# Patient Record
Sex: Male | Born: 1935 | Race: White | Hispanic: No | Marital: Single | Smoking: Former smoker
Health system: Southern US, Community
[De-identification: ages and names within clinical notes are randomized; demographics above are authoritative.]

## PROBLEM LIST (undated history)

## (undated) DIAGNOSIS — M1990 Unspecified osteoarthritis, unspecified site: Secondary | ICD-10-CM

## (undated) DIAGNOSIS — I10 Essential (primary) hypertension: Secondary | ICD-10-CM

## (undated) DIAGNOSIS — Z972 Presence of dental prosthetic device (complete) (partial): Secondary | ICD-10-CM

## (undated) DIAGNOSIS — I2510 Atherosclerotic heart disease of native coronary artery without angina pectoris: Principal | ICD-10-CM

## (undated) DIAGNOSIS — Z9889 Other specified postprocedural states: Secondary | ICD-10-CM

## (undated) DIAGNOSIS — E0500 Thyrotoxicosis with diffuse goiter without thyrotoxic crisis or storm: Secondary | ICD-10-CM

## (undated) DIAGNOSIS — Z85828 Personal history of other malignant neoplasm of skin: Secondary | ICD-10-CM

## (undated) DIAGNOSIS — C4491 Basal cell carcinoma of skin, unspecified: Secondary | ICD-10-CM

## (undated) DIAGNOSIS — K5909 Other constipation: Secondary | ICD-10-CM

## (undated) DIAGNOSIS — N400 Enlarged prostate without lower urinary tract symptoms: Secondary | ICD-10-CM

## (undated) DIAGNOSIS — Z973 Presence of spectacles and contact lenses: Secondary | ICD-10-CM

## (undated) DIAGNOSIS — K219 Gastro-esophageal reflux disease without esophagitis: Secondary | ICD-10-CM

## (undated) DIAGNOSIS — E785 Hyperlipidemia, unspecified: Secondary | ICD-10-CM

## (undated) DIAGNOSIS — C3490 Malignant neoplasm of unspecified part of unspecified bronchus or lung: Secondary | ICD-10-CM

## (undated) DIAGNOSIS — Z9861 Coronary angioplasty status: Principal | ICD-10-CM

## (undated) DIAGNOSIS — J439 Emphysema, unspecified: Secondary | ICD-10-CM

## (undated) DIAGNOSIS — Z955 Presence of coronary angioplasty implant and graft: Secondary | ICD-10-CM

## (undated) DIAGNOSIS — K08109 Complete loss of teeth, unspecified cause, unspecified class: Secondary | ICD-10-CM

## (undated) DIAGNOSIS — I6523 Occlusion and stenosis of bilateral carotid arteries: Secondary | ICD-10-CM

## (undated) DIAGNOSIS — N433 Hydrocele, unspecified: Secondary | ICD-10-CM

## (undated) DIAGNOSIS — Z974 Presence of external hearing-aid: Secondary | ICD-10-CM

## (undated) HISTORY — PX: APPENDECTOMY: SHX54

## (undated) HISTORY — PX: UMBILICAL HERNIA REPAIR: SHX2598

## (undated) HISTORY — PX: CATARACT EXTRACTION W/ INTRAOCULAR LENS  IMPLANT, BILATERAL: SHX1307

## (undated) HISTORY — DX: Benign prostatic hyperplasia without lower urinary tract symptoms: N40.0

## (undated) HISTORY — PX: TRANSTHORACIC ECHOCARDIOGRAM: SHX275

## (undated) HISTORY — DX: Atherosclerotic heart disease of native coronary artery without angina pectoris: I25.10

## (undated) HISTORY — DX: Essential (primary) hypertension: I10

## (undated) HISTORY — DX: Hyperlipidemia, unspecified: E78.5

## (undated) HISTORY — DX: Malignant neoplasm of unspecified part of unspecified bronchus or lung: C34.90

## (undated) HISTORY — DX: Coronary angioplasty status: Z98.61

## (undated) HISTORY — DX: Basal cell carcinoma of skin, unspecified: C44.91

## (undated) HISTORY — PX: PERCUTANEOUS CORONARY STENT INTERVENTION (PCI-S): SHX6016

## (undated) HISTORY — DX: Unspecified osteoarthritis, unspecified site: M19.90

---

## 2004-07-21 DIAGNOSIS — Z9861 Coronary angioplasty status: Secondary | ICD-10-CM

## 2004-07-21 DIAGNOSIS — I2510 Atherosclerotic heart disease of native coronary artery without angina pectoris: Secondary | ICD-10-CM

## 2004-07-21 HISTORY — DX: Atherosclerotic heart disease of native coronary artery without angina pectoris: I25.10

## 2004-07-21 HISTORY — DX: Coronary angioplasty status: Z98.61

## 2004-09-11 ENCOUNTER — Encounter: Admission: RE | Admit: 2004-09-11 | Discharge: 2004-09-11 | Payer: Self-pay | Admitting: *Deleted

## 2004-09-16 ENCOUNTER — Ambulatory Visit (HOSPITAL_COMMUNITY): Admission: RE | Admit: 2004-09-16 | Discharge: 2004-09-17 | Payer: Self-pay | Admitting: *Deleted

## 2004-09-16 DIAGNOSIS — Z955 Presence of coronary angioplasty implant and graft: Secondary | ICD-10-CM

## 2004-09-16 HISTORY — DX: Presence of coronary angioplasty implant and graft: Z95.5

## 2007-11-18 ENCOUNTER — Encounter: Admission: RE | Admit: 2007-11-18 | Discharge: 2007-11-18 | Payer: Self-pay | Admitting: General Surgery

## 2007-11-23 ENCOUNTER — Encounter (INDEPENDENT_AMBULATORY_CARE_PROVIDER_SITE_OTHER): Payer: Self-pay | Admitting: General Surgery

## 2007-11-23 ENCOUNTER — Ambulatory Visit (HOSPITAL_BASED_OUTPATIENT_CLINIC_OR_DEPARTMENT_OTHER): Admission: RE | Admit: 2007-11-23 | Discharge: 2007-11-23 | Payer: Self-pay | Admitting: General Surgery

## 2008-01-06 HISTORY — PX: NM MYOVIEW LTD: HXRAD82

## 2010-12-03 NOTE — Op Note (Signed)
NAME:  Jeremy Deleon, Jeremy Deleon NO.:  [id number]   MEDICAL RECORD NO.:  [id number]          PATIENT TYPE:  AMB   LOCATION:  NESC                         FACILITY:  [facility]   PHYSICIAN:  Anselm Pancoast. Weatherly, M.D.DATE OF BIRTH:  [birth year]   DATE OF PROCEDURE:  11/23/2007  DATE OF DISCHARGE:                               OPERATIVE REPORT   PREOPERATIVE DIAGNOSIS:  Umbilical hernia   OPERATION:  Repair of umbilical hernia with mesh.   ANESTHESIA:  General anesthesia.   SURGEON:  Consuello Bossier, M.D.   HISTORY:  Jeremy Deleon is a 77 year old moderately overweight male who was  referred to me through the courtesy of physician at [facility] for an  umbilical hernia.  The patient actually saw him the first of April, but  he had seen Dr. Maryagnes Amos in 2005 with an umbilical hernia and was scheduled  for repair, but because he was having problems with dizziness and it  turned out to be a cardiac issue, he postponed it.  His wife also  developed breast cancer and is on hemodialysis and for these various  reasons he postponed the surgery until now.  He is retired.  He has been  cleared by Dr. Jenne Campus.  He is on Plavix and that was discontinued for  about 5 days.  He is now here for repair of this umbilical hernia that  protrudes out about 3 inches and is about the size of a coffee cup.   DESCRIPTION OF PROCEDURE:  The patient was taken to the operative suite.  He was given a gram of Ancef, induction of general anesthesia and then  the abdomen prepped with Betadine surgical solution and draped in a  sterile manner.  Attention was made clean out the little fold and down  in the crevice of the area where the hernia kind of protrudes over  itself, there was a little skin irritation.  I draped him in a sterile  manner and then first actually just removed the little area of skin that  is kind of chronically ulcerated where it has been folded on itself.  I  did then separate the overlying  skin from the hernia sac  circumferentially and then opened down into the hernia sac and the  little omentum that was coming out through a little fascial defect about  the size of a 50 cent piece was actually removed and the little pedicles  tied with 2-0 Vicryl.  Then I freed up the hernia sac circumferentially,  actually removed it and closed the peritoneum transversely with a  running 2-0 Vicryl.  Next I created a little pocket completely around  the fascia overlying the peritoneum, so a little piece of regular  Prolene mesh about 3 inches x 3 inches could be placed.  The corners  were anchored out with 0 Prolene sutures and then we used a couple of  Prolene sutures superiorly, inferiorly and then actually closed the  little defect with Surgilon 0 since it is a little softer where there is  no subcutaneous tissue.  Next I anesthetized the  fascia with about 17 or  18 mL of 0.5% Marcaine with adrenalin.  I then trimmed about half of  this excess skin so I could close it transversely with 3-0 Vicryl in the  subcutaneous tissue, 5-0 nylon simple sutures and it would give a little  indentation at the umbilicus.  I then used a 4x4 kind of folded up  placed in the navel, a little antibiotic ointment on the suture line and  then 4x4s and tapes over  the area.  The patient tolerated the procedure nicely and will be  released after a short stay.  He is having a little problem with his  voiding and hopefully will be able to void.  If he is not, he is to turn  to the emergency room.  He can restart his Plavix tomorrow for his  cardiac issues.           ______________________________  Anselm Pancoast. Zachery Dakins, M.D.     WJW/MEDQ  D:  11/23/2007  T:  11/23/2007  Job:  [id number]   cc:   Dr. Mickle Asper. Zachery Dakins, M.D.  [address], [address]  [place]  [place] [address]

## 2010-12-06 NOTE — Cardiovascular Report (Signed)
NAME:  [name], Jeremy Deleon NO.:  [id number]   MEDICAL RECORD NO.:  [id number]          PATIENT TYPE:  OIB   LOCATION:  2899                         FACILITY:  [facility]   PHYSICIAN:  Darlin Priestly, MD  DATE OF BIRTH:  [birth year]   DATE OF PROCEDURE:  09/16/2004  DATE OF DISCHARGE:                              CARDIAC CATHETERIZATION   PROCEDURES:  1.  Coronary angiography.  2.  LAD-proximal.      1.  Percutaneous transluminal coronary balloon angioplasty.      2.  Placement of intracoronary stent.   ATTENDING PHYSICIAN:  Darlin Priestly, M.D.   COMPLICATIONS:  None.   INDICATIONS:  Mr. Wrage is a 77 year old male patient of Dr. Gabriel Earing  recently had an episode of presyncope associated with nausea.  He  subsequently underwent a Cardiolite stress test on July 25, 2004 revealing  inferolateral wall ischemia.  A 2-D echocardiogram revealed normal EF.  Following his abnormal Cardiolite, he underwent cardiac catheterization on  August 30, 2004 revealing 80% proximal LAD lesion crossing a first  diagonal.  There was no further significant coronary artery disease with  normal EF.  He is now referred for percutaneous intervention of the LAD.   DESCRIPTION OF OPERATION:  After obtaining informed written consent, the  patient was brought to the cardiac catheterization laboratory.  Right groin  was shaved and prepped and draped in the usual sterile fashion.  ECG monitor  was established.  Using the modified Seldinger technique, a 6 French  arterial sheath was inserted in the right femoral artery. Next, multiple  guide catheters were used in attempt to cannulate the LAD including a 6 and  7 [place] JL-4, 6 and 7 [place] JL-5, 7 [place] JL-4.5, Voda XB 3.5 with side  holes, 7 [place]; however, ultimately able to cannulate the left main  with a 7 French 3.5 guiding catheter with side holes.  Next, 0.014 ATW  marker wire was advanced out of the guiding catheter  and positioned in the  distal LAD without difficulty.  Following this, a Maverick 2.5 x 15-mm  balloon was then dropped across the proximal LAD stenotic lesion.  One  inflation of 6 atmospheres performed for a total of approximately 30  seconds.  Followup angiogram revealed no evidence of dissection or thrombus  with TIMI-3 flow to the distal vessel.  Next, we used a Taxus 3.0 x 16-mm  stent across the proximal LAD stenotic lesion.  This stent was initiated  deployed to a maximum of 8 atmospheres for a total of 35 seconds.  A second  inflation to 14 atmospheres performed for 25 seconds.  Followup angiogram  with no evidence of dissection or thrombus with TIMI-3 flow to the distal  vessel.  IV Angiomax was used throughout the case.   Final orthogonal angiograms reveal less than 10% residual stenosis in the  proximal LAD stenotic lesion with TIMI-3 flow to the distal vessel.  At this  point we elected to conclude the procedure.  All balloons, wires, and  catheters were removed.  Hemostatic sheath  was sewn in place and the patient  transferred to recovery room in stable condition.   CONCLUSIONS:  1.  Successful percutaneous transluminal coronary balloon angioplasty and a      placement of a Taxus 3.0 x 16-mm      stent in the proximal left anterior descending stenotic lesion.  2.  Tortuosity of the right iliac system.  3.  Adjunct use of Angiomax infusion.      RHM/MEDQ  D:  09/16/2004  T:  09/16/2004  Job:  [id number]

## 2010-12-06 NOTE — H&P (Signed)
NAME:  Jeremy Deleon, Jeremy Deleon NO.:  [id number]   MEDICAL RECORD NO.:  [id number]          PATIENT TYPE:  OIB   LOCATION:  2899                         FACILITY:  [facility]   PHYSICIAN:  Darlin Priestly, MD  DATE OF BIRTH:  1933/03/06   DATE OF ADMISSION:  09/16/2004  DATE OF DISCHARGE:                                HISTORY & PHYSICAL   Jeremy Deleon is a 77 year old white married male patient who was initially  seen in the office by Dr. Lenise Herald on August 09, 2004, referred by  Dr. Andi Devon.  He had had an episode of syncope.  He then underwent a  treadmill Cardiolite test and a 2-D echocardiogram.  Echo did not show any  structural abnormalities.  He had normal EF.  The Cardiolite test did show  some inferior ischemia, thus he went on to undergo heart catheterization.  This was performed at [facility] and he had multiple stenoses  in his LAD, 80% proximal and 60% mid and mid distal.  He had a 50% in his  RCA.  His EF was 60%.  He comes to [employer] today for  intervention.   PAST MEDICAL HISTORY:  Negative for any chronic illnesses.  He had had no  prior history of hypertension or diabetes.  He quit smoking about 25 years  prior.  He did not have a premature family history of heart disease.  In the  office, though, his blood pressure was elevated at 170/100, thus he is now  on hypertensive medication.   MEDICATIONS:  1.  Toprol XL 50 mg every day.  2.  Celebrex 200 mg every day p.r.n.  3.  Enteric coated aspirin 325 mg every day.  4.  Diovan 80 mg every day.  5.  Zocor 20 mg at bedtime.  6.  Plavix 75 mg every day.   ALLERGIES:  No known drug allergies.  He is not allergic to fish, iodine or  any dye.   FAMILY HISTORY:  He denies any sudden death or early coronary disease.  His  father did have open heart surgery at the age of 65.   SOCIAL HISTORY:  He is married for 46 years.  He has three children and five  grandchildren.  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  He has had four years of college.  He use to smoke two packs per  day for about 20 years.  He quit about 25 years ago.   PHYSICAL EXAMINATION:  GENERAL APPEARANCE:  He is in no acute distress.  He  is pleasant.  VITAL SIGNS:  Blood pressure today is 119/66, heart rate 58, temperature  97.1, O2 saturations 94%.  His weight is 220 pounds.  HEENT:  Pupils are equal and round.  LUNGS:  Clear to auscultation bilaterally with good inspiratory effort.  CARDIOVASCULAR:  Heart sounds are regular.  S1 and S2 are present.  No  murmur or gallop is noted.  ABDOMEN:  Benign.  Bowel sounds present  x4.  EXTREMITIES:  He has no lower extremity edema.  NEUROLOGIC:  He is alert and oriented x3.  MUSCULOSKELETAL:  Moves all extremities x4.   REVIEW OF SYMPTOMS:  He denies any further syncope, no presyncope, no  dizziness, no unilateral weakness, no recent fall.  No cough, cold or any  fever.  No indigestion, no heartburn.  No black-tarry stools.  No dysuria.  No lower extremity edema.  He is having some ankle discomfort on occasion.  He is wondering if this is from the Zocor.  It is only intermittent and it  is not constant.  Other systems are unremarkable.   ASSESSMENT:  1.  Coronary artery disease with high grade blockage in his left anterior      descending, diffusely diseased from proximal to mid.  A 50% right      coronary artery.  2.  Ejection fraction of 50%.  3.  History of syncope.  It is not known if his coronary artery disease was      the cause of his syncope at this time.  4.  Normal ejection fraction.  5.  Hypertension.  6.  Hyperlipidemia.   PLAN:  He is here to undergo cardiac catheterization.  Risks and benefits  explained.      BB/MEDQ  D:  09/16/2004  T:  09/16/2004  Job:  [id number]   cc:   Gabriel Earing, M.D.  [address]  [place]  [place] [address]  Fax: [phone]

## 2010-12-06 NOTE — Discharge Summary (Signed)
NAME:  Jeremy Deleon, Jeremy Deleon NO.:  [id number]   MEDICAL RECORD NO.:  [id number]          PATIENT TYPE:  OIB   LOCATION:  6525                         FACILITY:  [facility]   PHYSICIAN:  Talmage Coin, M.D.     DATE OF BIRTH:  1933-01-11   DATE OF ADMISSION:  09/16/2004  DATE OF DISCHARGE:  09/17/2004                                 DISCHARGE SUMMARY   ADMISSION DIAGNOSES:  1.  History of syncope.  2.  Positive Cardiolite and cardiac catheterization showing 80% left      anterior descending with normal ejection fraction.   DISCHARGE DIAGNOSES:  1.  Syncope.  2.  Coronary artery disease with 80% left anterior descending and normal      ejection fraction.  3.  Hypertension.  4.  Hyperlipidemia.  5.  Overweight, body mass index 28.3.   HISTORY OF PRESENT ILLNESS:  The patient is a 77 year old male medical  patient of Gabriel Earing, M.D. at [facility], who was referred to Darlin Priestly, M.D.  He had an episode of syncope back in November prior to  Thanksgiving.  He was on the job and not doing any strenuous work and felt  nauseated and subsequently passed out.  He eventually was sent to our office  and underwent a two-dimensional echocardiogram, a Cardiolite on July 25, 2004.  He had no structural disease, but he had a positive Cardiolite.  He  was subsequently scheduled and underwent cardiac catheterization at the  [facility].  This showed an 80% LAD from above the first  diagonal to below the first diagonal and then a distal 50% stenosis of the  LAD after the second diagonal.  The other coronary arteries were normal.  He  was subsequently admitted at this time to undergo PCI and stenting of his  LAD stenosis.   PAST MEDICAL HISTORY:  Negative for chronic illnesses, no prior  hypertension, diabetes.  He was placed on Zocor after his initial lipid  study, but I do not have those results.   FAMILY HISTORY:  Essentially negative except for a father who  had coronary  artery bypass grafting at age 66.   SOCIAL HISTORY:  He has been married for 46 years, three children, five  grandchildren.  He smoked two packs a day for 20 years, and quit 25 years  ago.  EtOH; occasional beer.   ALLERGIES:  No known drug allergies.   His blood pressure in the office visit on August 09, 2004, was 170/100 and  then 150/100.   For further history and physical, please see the dictated note.   MEDICATIONS:  1.  Toprol XL 50 mg daily.  2.  Celebrex 200 mg daily.  3.  Diovan 80 mg daily.  4.  Ecotrin 325 mg daily.  5.  Plavix 75 mg daily.  6.  Zocor 20 mg daily.   HOSPITAL COURSE:  The patient was admitted and taken to the catheterization  lab.  He underwent PCI and stenting using a Taxus 3.0 x 16 mm stent.  The  lesion  treated was 80% and was reduced to less than 10%.   The patient did well overnight.  The following a.m. he had some lateral 1 mm  depression.  EKG was repeated later and the ST depression was resolved.  His  CK was 58, troponin 0.8, and it was [employer]. Jacinto Halim, M.D. opinion that he could  be discharged home if he had no chest pain with ambulating.  IMDUR was added  to his medicines.   DISCHARGE MEDICATIONS:  1.  Plavix 75 mg one daily.  2.  IMDUR 30 mg daily.  3.  Aspirin 81 mg daily.  4.  Toprol XL 50 mg daily.  5.  Celebrex 200 mg daily.  6.  Zocor 20 mg daily.  7.  Diovan 80 mg daily.  8.  Nitroglycerin sublingual 1/150 SL under the tongue p.r.n. for chest      pain.  If he needs three or more, he is to contact our office.   ACTIVITY:  Light to moderate for 72 hours, and then he can resume his prior  activities as before.   DIET:  Low fat, low salt.   DISCHARGE INSTRUCTIONS:  He is to call our office if he has any groin  problems.  Contact Dr. Andi Devon for follow-up and Dr. Mikey Bussing office will  arrange follow-up with him in two weeks.      WDJ/MEDQ  D:  09/17/2004  T:  09/17/2004  Job:  [id number]   cc:   Gabriel Earing,  M.D.  [address]  [place]  [place] [address]  Fax: [phone]

## 2011-02-19 DIAGNOSIS — R1310 Dysphagia, unspecified: Secondary | ICD-10-CM | POA: Insufficient documentation

## 2011-03-18 DIAGNOSIS — R350 Frequency of micturition: Secondary | ICD-10-CM | POA: Insufficient documentation

## 2011-04-15 LAB — COMPREHENSIVE METABOLIC PANEL
BUN: 14
CO2: 27
Calcium: 9.1
Chloride: 106
Creatinine, Ser: 1.17
GFR calc non Af Amer: >60
Total Bilirubin: 0.7

## 2011-04-15 LAB — CBC
HCT: 45.6
MCHC: 33.5
MCV: 92.8
Platelets: 181
RBC: 4.91
WBC: 6

## 2011-04-15 LAB — DIFFERENTIAL
Basophils Absolute: 0
Lymphocytes Relative: 36
Lymphs Abs: 2.2
Neutro Abs: 3

## 2013-01-25 ENCOUNTER — Other Ambulatory Visit: Payer: Self-pay | Admitting: Cardiology

## 2013-01-25 NOTE — Telephone Encounter (Signed)
Rx was sent to pharmacy electronically. 

## 2013-04-04 ENCOUNTER — Other Ambulatory Visit: Payer: Self-pay | Admitting: Cardiology

## 2013-04-04 NOTE — Telephone Encounter (Signed)
Rx was sent to pharmacy electronically. 

## 2013-07-29 ENCOUNTER — Ambulatory Visit: Payer: Self-pay | Admitting: Cardiology

## 2013-08-02 ENCOUNTER — Other Ambulatory Visit: Payer: Self-pay | Admitting: Cardiology

## 2013-08-02 NOTE — Telephone Encounter (Signed)
Rx was sent to pharmacy electronically. 

## 2013-08-31 ENCOUNTER — Other Ambulatory Visit: Payer: Self-pay | Admitting: Internal Medicine

## 2013-09-19 ENCOUNTER — Telehealth: Payer: Self-pay | Admitting: Cardiology

## 2013-09-19 NOTE — Telephone Encounter (Signed)
Returned call and pt verified x 2.  Pt c/o having sweating spells in the mornings where he has to change clothes.  Stated this has been going on x 2 weeks.  Stated he skipped his medicine yesterday and didn't have any sweating spells.  Denied fever or chills.  Stated 2 weeks ago he had an intestinal virus, but he got over it.  Pt c/o having sweating spells w/ the virus.  Pt denied CP or SOB.  Advice:  F/u with PCP or provider seen for intestinal virus as he may need abx or at least evaluation.  Schedule f/u appt w/ Dr. Ellyn Hack as he is it was due in January.    Pt verbalized understanding and agreed w/ plan.  Appt scheduled for 4.6.15 at 2:45pm w/ Dr. Ellyn Hack for f/u.

## 2013-09-19 NOTE — Telephone Encounter (Signed)
Please call-he have been having sweating spells,under shirt is soaked when this happen.

## 2013-10-10 ENCOUNTER — Other Ambulatory Visit: Payer: Self-pay | Admitting: Internal Medicine

## 2013-10-11 MED ORDER — ISOSORBIDE MONONITRATE ER 30 MG PO TB24: 30.0000 mg | ORAL_TABLET | Freq: Every day | ORAL | Status: DC

## 2013-10-20 ENCOUNTER — Encounter: Payer: Self-pay | Admitting: *Deleted

## 2013-10-21 ENCOUNTER — Encounter: Payer: Self-pay | Admitting: *Deleted

## 2013-10-24 ENCOUNTER — Encounter: Payer: Self-pay | Admitting: Cardiology

## 2013-10-24 ENCOUNTER — Ambulatory Visit (INDEPENDENT_AMBULATORY_CARE_PROVIDER_SITE_OTHER): Payer: Medicare Other | Admitting: Cardiology

## 2013-10-24 VITALS — BP 114/72 | HR 62 | Ht 74.0 in | Wt 203.7 lb

## 2013-10-24 DIAGNOSIS — I2510 Atherosclerotic heart disease of native coronary artery without angina pectoris: Secondary | ICD-10-CM

## 2013-10-24 DIAGNOSIS — I10 Essential (primary) hypertension: Secondary | ICD-10-CM

## 2013-10-24 DIAGNOSIS — Z9861 Coronary angioplasty status: Secondary | ICD-10-CM

## 2013-10-24 DIAGNOSIS — E785 Hyperlipidemia, unspecified: Secondary | ICD-10-CM

## 2013-10-24 MED ORDER — METOPROLOL SUCCINATE ER 25 MG PO TB24: 25.0000 mg | ORAL_TABLET | Freq: Every day | ORAL | Status: DC

## 2013-10-24 MED ORDER — SIMVASTATIN 40 MG PO TABS: ORAL_TABLET | ORAL | Status: DC

## 2013-10-24 MED ORDER — FENOFIBRATE 145 MG PO TABS: ORAL_TABLET | ORAL | Status: DC

## 2013-10-24 MED ORDER — ISOSORBIDE MONONITRATE ER 30 MG PO TB24: 30.0000 mg | ORAL_TABLET | Freq: Every day | ORAL | Status: DC

## 2013-10-24 NOTE — Patient Instructions (Addendum)
PRESCRIPTION   Metoprolol succ 25 mg once a day Simvastatin 40 mg once a day  Valsartan - HCTZ 160/12.5 mg  Once a day Fenofibrate 145 mg one a day Isosobride mono 30 mg once a day Aspirin 325 mg once a day   Your physician wants you to follow-up in 12 MONTHS Dr  Ellyn Hack.  You will receive a reminder letter in the mail two months in advance. If you don't receive a letter, please call our office to schedule the follow-up appointment.

## 2013-10-25 ENCOUNTER — Encounter: Payer: Self-pay | Admitting: Cardiology

## 2013-10-25 DIAGNOSIS — I10 Essential (primary) hypertension: Secondary | ICD-10-CM | POA: Insufficient documentation

## 2013-10-25 DIAGNOSIS — Z9861 Coronary angioplasty status: Principal | ICD-10-CM

## 2013-10-25 DIAGNOSIS — E785 Hyperlipidemia, unspecified: Secondary | ICD-10-CM | POA: Insufficient documentation

## 2013-10-25 DIAGNOSIS — I2510 Atherosclerotic heart disease of native coronary artery without angina pectoris: Secondary | ICD-10-CM | POA: Insufficient documentation

## 2013-10-25 NOTE — Assessment & Plan Note (Signed)
On combination of simvastatin at 40 mg and fenofibrate. We need to be very cautious about this interaction for possible myalgias or myositis. My preference would be to switch to atorvastatin, this is being followed by his PCP will defer to PCP.  Recommendation: Convert to atorvastatin 40 mg daily from simvastatin for additional lipid control as his LDL is not at goal.

## 2013-10-25 NOTE — Progress Notes (Signed)
PATIENT: Jeremy Deleon MRN: [id number]  DOB: Jan 04, 1933   DOV:10/25/2013 PCP: [name], MD  Clinic Note: Chief Complaint  Patient presents with  . 78 month visit    no chest pain , no sob ,edema in feet a little. sweating episode last month became   ?virus   HPI: Jeremy Deleon is a 80 y.o.  male with a PMH below who presents today for annual followup of his single vessel CAD.  He comes in today feeling relatively well with only major complaint being his neck discomfort. This is musculoskeletal/arthritic type pain that isn't bothering him off and on for several years but worse now.  Interval History: Has been 15 months is a glass on, he has not had a cardiac standpoint besides his stable mild lower extremity edema. He denies any chest tightness/pressure or dyspnea at rest or exertion. No PND orthopnea. The remainder of Cardiovascular ROS: negative for - irregular heartbeat, loss of consciousness, murmur, orthopnea, palpitations, paroxysmal nocturnal dyspnea, rapid heart rate, shortness of breath or Lightheadedness/dizziness, TIA/amaurosis fugax, melena, hematochezia, hematuria, epistaxis, claudication:  Past Medical History  Diagnosis Date  . CAD S/P percutaneous coronary angioplasty January 2006    Abnormal Myoview with inferolateral ischemia: --> 80% prox LAD lesion PCI: Taxus DES 3.0 mm x 16 mm-;; Myoview June 2009 no ischemia or infarction with attenuation artifact  . Hypertension   . Dyslipidemia, goal LDL below 70     on simvastatin and tricor   Prior Cardiac Evaluation and Past Surgical History: Past Surgical History  Procedure Laterality Date  . Nm myoview ltd  07/2004    inferolatera wall ischemia  . Nm myocar perf wall motion  January 06, 2008     treadmill  myoview - tissue attenuation but no ischemia or infarction  . Doppler echocardiography  June 18 ,2009    mild concentric LVH, NORMAL ef, MILDLY  DILATED LEFT ATRIUM  AND MILD SCLEROTIC  AORTIC  VALVE AND OTHERWISE  NORMAL    . Cardiac catheterization  07/2004    80% proximal LAD lesion--PCI, EF 60%  . Percutaneous coronary stent intervention (pci-s)  2/27/ 2006    TAXUS DES 3.0 mm x 16 mm stent ->  PROX  LAD  . Renal doppler  05/03/2007    rgt and lft renal arteries normal patency; rgt and lft kidneys normal size   Allergies  Allergen Reactions  . Niaspan [Niacin Er] Other (See Comments)    flushing    Current Outpatient Prescriptions  Medication Sig Dispense Refill  . aspirin EC 325 MG tablet Take 325 mg by mouth daily.      . fenofibrate (TRICOR) 145 MG tablet TAKE 1 TABLET BY MOUTH EVERY DAY  90 tablet  3  . isosorbide mononitrate (IMDUR) 30 MG 24 hr tablet Take 1 tablet (30 mg total) by mouth daily.  90 tablet  3  . simvastatin (ZOCOR) 40 MG tablet TAKE 1 TABLET EVERY DAY  90 tablet  3  . valACYclovir (VALTREX) 1000 MG tablet Take 1,000 mg by mouth 3 (three) times daily as needed.      . valsartan-hydrochlorothiazide (DIOVAN-HCT) 160-12.5 MG per tablet Take 1 tablet by mouth daily.      . vitamin B-12 (CYANOCOBALAMIN) 100 MCG tablet Take 100 mcg by mouth daily.      . metoprolol succinate (TOPROL XL) 50 MG 24 hr tablet Take 1 tablet (50 mg total) by mouth daily.     No current facility-administered medications for this visit.  Medications he is currently taking were reviewed with his pharmacy. They do not coincide with the medication listed the is carrying with him from his PCP. The second clarification, he is AND not on stand-alone valsartan and is on metoprolol succinate as opposed to tartrate.  He he had not been taking his metoprolol for over one month prior to this visit.  History   Social History Narrative    He is a widowed father of 98, grandfather of 51, still working. Does not smoke, quit 30   years ago and does not drink alcohol. He still works in his Nurse, learning disability for [employer].   ROS: A comprehensive Review of Systems - Negative except Neck arthritis pain and  burning sensation in his hands  PHYSICAL EXAM BP 114/72  Pulse 62  Ht 6\' 2"  (1.88 m)  Wt 203 lb 11.2 oz (92.398 kg)  BMI 26.14 kg/m2 General: he is a very pleasant, healthy-appearing, elderly gentleman, NAD, A&O x3. He is somewhat hard of hearing.  HEENT: NCAT. EOMI. MMM. Anicteric sclerae.  Neck: Supple with no LAN, JVD, or carotid bruit. Heart: RRR. Normal S1, S2. No M/R/G. Nondisplaced PMI.  Lungs: CTAB. Nonlabored. Normal effort. Good air movement. Abdomen: Soft/NT/ND/NABS. No HSM.  Extremities: No C/C. Trace to 1+ LE edema bilaterally. Normal strength 5/5 throughout   Adult ECG Report  Rate: 62 ;  Rhythm: normal sinus rhythm  QRS Axis: 22 ;  PR Interval: 196 ;  QRS Duration: 100 ; QTc: 410  Voltages: Normal  Conduction Disturbances: none  Other Abnormalities: none   Narrative Interpretation: Normal EKG  Recent Labs: Checked by PCP February 2015  Vitamin D: 9.7;; TSH 0.9  CBC: 7.0; 13.8/40.8; 236;; CMP normal with creatinine 1.13, glucose 88. LFTs normal  Lipid panel: TC 151, TG 124, HDL 40, LDL 84  ASSESSMENT / PLAN: CAD S/P percutaneous coronary angioplasty -- Taxus DES in Prox LAD No active his symptoms. 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 He does not below walking or other routine exercise. He needs to get back to his beta blocker, but with a heart rate of 62 not on 50 mg of Toprol, I am reluctant to restart at full 50 mg. Will start back at 25 mg of metoprolol succinate (Toprol). Otherwise is statin, ARB and aspirin.  Since the stent is close to 78 years old, were probably be a mistake in the dual antiplatelet therapy. In the absence of symptoms, would not plan to do another ischemic evaluation.  Hypertension Well-controlled blood pressure in the absence of Toprol. I anticipate that 3 starting 25 mg Toprol will not make that much a much of difference in the overall pressure.  Dyslipidemia, goal LDL below 70 On combination of  simvastatin at 40 mg and fenofibrate. We need to be very cautious about this interaction for possible myalgias or myositis. My preference would be to switch to atorvastatin, this is being followed by his PCP will defer to PCP.  Recommendation: Convert to atorvastatin 40 mg daily from simvastatin for additional lipid control as his LDL is not at goal.    Meds ordered this encounter  Medications  . metoprolol succinate (TOPROL XL) 25 MG 24 hr tablet    Sig: Take 1 tablet (25 mg total) by mouth daily.    Dispense:  90 tablet    Refill:  3    Orders Placed This Encounter  Procedures  . EKG 12-Lead    Followup: 1 year  Devann Cribb W. Ellyn Hack, M.D., M.S. Interventional Cardiology [facility]

## 2013-10-25 NOTE — Assessment & Plan Note (Signed)
Well-controlled blood pressure in the absence of Toprol. I anticipate that 3 starting 25 mg Toprol will not make that much a much of difference in the overall pressure.

## 2013-10-25 NOTE — Assessment & Plan Note (Signed)
No active his symptoms. 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 He does not below walking or other routine exercise. He needs to get back to his beta blocker, but with a heart rate of 62 not on 50 mg of Toprol, I am reluctant to restart at full 50 mg. Will start back at 25 mg of metoprolol succinate (Toprol). Otherwise is statin, ARB and aspirin.  Since the stent is close to 78 years old, were probably be a mistake in the dual antiplatelet therapy. In the absence of symptoms, would not plan to do another ischemic evaluation.

## 2013-11-01 ENCOUNTER — Telehealth: Payer: Self-pay | Admitting: *Deleted

## 2013-11-01 NOTE — Telephone Encounter (Signed)
FAXED LAST OFFICE NOTE FROM 10/24/13 TO   PCP

## 2013-11-14 ENCOUNTER — Other Ambulatory Visit: Payer: Self-pay | Admitting: Internal Medicine

## 2013-11-14 NOTE — Telephone Encounter (Signed)
Rx was sent to pharmacy electronically. 

## 2014-02-21 ENCOUNTER — Telehealth: Payer: Self-pay | Admitting: *Deleted

## 2014-02-21 NOTE — Telephone Encounter (Signed)
Patient walked into office to inform us that yesterday around 9 am he was in a a Little building (that was air conditioned) and he began to feel weak and began to sweat. He had to change his shirt. He denied having any chest pain, SOB, or dizziness. He did have some slight nausea. Symptoms lasted only a brief moment. He rested and began to feel better. States that his BP was 108/58 and his heart rate was 67 regular. Patient states that he is symptom free today. Was across the hall at the orthopedic office and wanted to see if anyone could tell him what would cause his symptoms. Informed patient it is hard to know what caused his symptoms. The episode could have come from anything from being viral to dehydration. Patient does admit that he does not drink a lot of water. Recommended to patient to watch his symptoms. If he develop chest pain, SOB, dizziness or if the sweats persist he is to seek medical attention. Also recommended to the patient to increase his water intake in hopes to stay hydrated and stay out of the heat as much as possible. His symptoms can also be heat related due to the high temperatures and humidity.

## 2014-06-20 ENCOUNTER — Other Ambulatory Visit (HOSPITAL_COMMUNITY): Payer: Self-pay | Admitting: Internal Medicine

## 2014-06-20 DIAGNOSIS — R918 Other nonspecific abnormal finding of lung field: Secondary | ICD-10-CM

## 2014-06-27 LAB — PULMONARY FUNCTION TEST

## 2014-06-29 ENCOUNTER — Ambulatory Visit (HOSPITAL_COMMUNITY)
Admission: RE | Admit: 2014-06-29 | Discharge: 2014-06-29 | Disposition: A | Discharge status: Home / Self Care | Payer: Medicare Other | Source: Ambulatory Visit | Attending: Internal Medicine | Admitting: Internal Medicine

## 2014-06-29 DIAGNOSIS — R911 Solitary pulmonary nodule: Secondary | ICD-10-CM | POA: Insufficient documentation

## 2014-06-29 DIAGNOSIS — R918 Other nonspecific abnormal finding of lung field: Secondary | ICD-10-CM

## 2014-06-29 LAB — GLUCOSE, CAPILLARY: Glucose-Capillary: 83 mg/dL (ref 70–99)

## 2014-06-29 MED ORDER — FLUDEOXYGLUCOSE F - 18 (FDG) INJECTION
1350.0000 | Freq: Once | INTRAVENOUS | Status: AC | PRN
  Administered 2014-06-29: 1350 via INTRAVENOUS

## 2014-06-30 ENCOUNTER — Other Ambulatory Visit (HOSPITAL_COMMUNITY): Payer: Self-pay | Admitting: Internal Medicine

## 2014-06-30 DIAGNOSIS — R918 Other nonspecific abnormal finding of lung field: Secondary | ICD-10-CM

## 2014-07-05 ENCOUNTER — Other Ambulatory Visit (HOSPITAL_COMMUNITY): Payer: Self-pay | Admitting: Internal Medicine

## 2014-07-05 DIAGNOSIS — R918 Other nonspecific abnormal finding of lung field: Secondary | ICD-10-CM

## 2014-07-07 ENCOUNTER — Other Ambulatory Visit: Payer: Self-pay | Admitting: Radiology

## 2014-07-12 ENCOUNTER — Ambulatory Visit (HOSPITAL_COMMUNITY): Admission: RE | Admit: 2014-07-12 | Payer: Medicare Other | Source: Ambulatory Visit

## 2014-07-21 DIAGNOSIS — C3490 Malignant neoplasm of unspecified part of unspecified bronchus or lung: Secondary | ICD-10-CM

## 2014-07-21 HISTORY — DX: Malignant neoplasm of unspecified part of unspecified bronchus or lung: C34.90

## 2014-07-23 ENCOUNTER — Other Ambulatory Visit: Payer: Self-pay | Admitting: Radiology

## 2014-07-25 ENCOUNTER — Ambulatory Visit (HOSPITAL_COMMUNITY)
Admission: RE | Admit: 2014-07-25 | Discharge: 2014-07-25 | Disposition: A | Discharge status: Home / Self Care | Payer: Medicare Other | Source: Ambulatory Visit | Attending: Interventional Radiology | Admitting: Interventional Radiology

## 2014-07-25 ENCOUNTER — Encounter (HOSPITAL_COMMUNITY): Payer: Self-pay

## 2014-07-25 ENCOUNTER — Ambulatory Visit (HOSPITAL_COMMUNITY)
Admission: RE | Admit: 2014-07-25 | Discharge: 2014-07-25 | Disposition: A | Discharge status: Home / Self Care | Payer: Medicare Other | Source: Ambulatory Visit | Attending: Internal Medicine | Admitting: Internal Medicine

## 2014-07-25 DIAGNOSIS — Z9889 Other specified postprocedural states: Secondary | ICD-10-CM

## 2014-07-25 DIAGNOSIS — R918 Other nonspecific abnormal finding of lung field: Secondary | ICD-10-CM | POA: Diagnosis present

## 2014-07-25 DIAGNOSIS — J939 Pneumothorax, unspecified: Secondary | ICD-10-CM | POA: Insufficient documentation

## 2014-07-25 LAB — PROTIME-INR
INR: 1 (ref 0.00–1.49)
PROTHROMBIN TIME: 13.3 s (ref 11.6–15.2)

## 2014-07-25 LAB — CBC
HCT: 42.7 % (ref 39.0–52.0)
Hemoglobin: 14.4 g/dL (ref 13.0–17.0)
MCH: 30.4 pg (ref 26.0–34.0)
MCHC: 33.7 g/dL (ref 30.0–36.0)
MCV: 90.1 fL (ref 78.0–100.0)
Platelets: 214 10*3/uL (ref 150–400)
RBC: 4.74 MIL/uL (ref 4.22–5.81)
RDW: 14.1 % (ref 11.5–15.5)
WBC: 7.1 10*3/uL (ref 4.0–10.5)

## 2014-07-25 LAB — APTT: aPTT: 31 seconds (ref 24–37)

## 2014-07-25 MED ORDER — FENTANYL CITRATE 0.05 MG/ML IJ SOLN
INTRAMUSCULAR | Status: AC | PRN
  Administered 2014-07-25: 25 ug via INTRAVENOUS

## 2014-07-25 MED ORDER — MIDAZOLAM HCL 2 MG/2ML IJ SOLN
INTRAMUSCULAR | Status: AC | PRN
  Administered 2014-07-25: 1 mg via INTRAVENOUS

## 2014-07-25 MED ORDER — LIDOCAINE HCL 1 % IJ SOLN
INTRAMUSCULAR | Status: AC
  Filled 2014-07-25: qty 20

## 2014-07-25 MED ORDER — FENTANYL CITRATE 0.05 MG/ML IJ SOLN
INTRAMUSCULAR | Status: AC
  Filled 2014-07-25: qty 4

## 2014-07-25 MED ORDER — SODIUM CHLORIDE 0.9 % IV SOLN
Freq: Once | INTRAVENOUS | Status: AC
  Administered 2014-07-25: 09:00:00 via INTRAVENOUS

## 2014-07-25 MED ORDER — MIDAZOLAM HCL 2 MG/2ML IJ SOLN
INTRAMUSCULAR | Status: AC
  Filled 2014-07-25: qty 4

## 2014-07-25 NOTE — Sedation Documentation (Signed)
Patient is resting comfortably. 

## 2014-07-25 NOTE — Procedures (Signed)
LLL lung Bx 18 g core times 2 No comp

## 2014-07-25 NOTE — Sedation Documentation (Signed)
Biopsy performed

## 2014-07-25 NOTE — Sedation Documentation (Signed)
Bandaid applied to site. Left mid back.

## 2014-07-25 NOTE — Discharge Instructions (Signed)
Needle Biopsy of Lung, Care After °Refer to this sheet in the next few weeks. These instructions provide you with information on caring for yourself after your procedure. Your health care provider may also give you more specific instructions. Your treatment has been planned according to current medical practices, but problems sometimes occur. Call your health care provider if you have any problems or questions after your procedure. °WHAT TO EXPECT AFTER THE PROCEDURE °· A bandage will be applied over the area where the needle was inserted. You may be asked to apply pressure to the bandage for several minutes to ensure there is minimal bleeding. °· In most cases, you can leave when your needle biopsy procedure is completed. Do not drive yourself home. Someone else should take you home. °· If you received an IV sedative or general anesthetic, you will be taken to a comfortable place to relax while the medicine wears off. °· If you have upcoming travel scheduled, talk to your health care provider about when it is safe to travel by air after the procedure. °HOME CARE INSTRUCTIONS °· Expect to take it easy for the rest of the day. °· Protect the area where you received the needle biopsy by keeping the bandage in place for as long as instructed. °· You may feel some mild pain or discomfort in the area, but this should stop in a day or two. °· Take medicines only as directed by your health care provider. °SEEK MEDICAL CARE IF:  °· You have pain at the biopsy site that worsens or is not helped by medicine. °· You have swelling or drainage at the needle biopsy site. °· You have a fever. °SEEK IMMEDIATE MEDICAL CARE IF:  °· You have new or worsening shortness of breath. °· You have chest pain. °· You are coughing up blood. °· You have bleeding that does not stop with pressure or a bandage. °· You develop light-headedness or fainting. °Document Released: 05/04/2007 Document Revised: 11/21/2013 Document Reviewed:  11/29/2012 °ExitCare® Patient Information ©2015 [employer]. This information is not intended to replace advice given to you by your health care provider. Make sure you discuss any questions you have with your health care provider. ° °

## 2014-07-25 NOTE — H&P (Signed)
Chief Complaint: Chest pain L lung mass + PET  Referring Physician(s): Javaids,Adnan  History of Present Illness: Jeremy Deleon is a 81 y.o. male  Pt developed chest pain- "like muscle pain" in November Work up revealed L lung mass +PET 07/03/14 Now scheduled for biopsy of same Denies cough or sob  Past Medical History  Diagnosis Date  . CAD S/P percutaneous coronary angioplasty January 2006    Abnormal Myoview with inferolateral ischemia: --> 80% prox LAD lesion PCI: Taxus DES 3.0 mm x 16 mm-;; Myoview June 2009 no ischemia or infarction with attenuation artifact  . Hypertension   . Dyslipidemia, goal LDL below 70     on simvastatin and tricor    Past Surgical History  Procedure Laterality Date  . Nm myoview ltd  07/2004    inferolatera wall ischemia  . Nm myocar perf wall motion  January 06, 2008     treadmill  myoview - tissue attenuation but no ischemia or infarction  . Doppler echocardiography  June 18 ,2009    mild concentric LVH, NORMAL ef, MILDLY  DILATED LEFT ATRIUM  AND MILD SCLEROTIC  AORTIC  VALVE AND OTHERWISE  NORMAL  . Cardiac catheterization  07/2004    80% proximal LAD lesion--PCI, EF 60%  . Percutaneous coronary stent intervention (pci-s)  2/27/ 2006    TAXUS DES 3.0 mm x 16 mm stent ->  PROX  LAD  . Renal doppler  05/03/2007    rgt and lft renal arteries normal patency; rgt and lft kidneys normal size    Allergies: Niaspan  Medications: Prior to Admission medications   Medication Sig Start Date End Date Taking? Authorizing Provider  aspirin EC 325 MG tablet Take 325 mg by mouth daily.   Yes Historical Provider, MD  fenofibrate (TRICOR) 145 MG tablet TAKE 1 TABLET BY MOUTH EVERY DAY Patient taking differently: Take 145 mg by mouth daily. TAKE 1 TABLET BY MOUTH EVERY DAY 10/24/13  Yes Leonie Man, MD  isosorbide mononitrate (IMDUR) 30 MG 24 hr tablet Take 1 tablet (30 mg total) by mouth daily. 10/24/13  Yes Leonie Man, MD  metoprolol succinate  (TOPROL XL) 25 MG 24 hr tablet Take 1 tablet (25 mg total) by mouth daily. 10/24/13  Yes Leonie Man, MD  simvastatin (ZOCOR) 40 MG tablet TAKE 1 TABLET EVERY DAY Patient taking differently: Take 40 mg by mouth daily at 6 PM. TAKE 1 TABLET EVERY DAY 10/24/13  Yes Leonie Man, MD  traMADol (ULTRAM) 50 MG tablet Take 50 mg by mouth every 6 (six) hours as needed for moderate pain.   Yes Historical Provider, MD  valsartan-hydrochlorothiazide (DIOVAN-HCT) 160-12.5 MG per tablet TAKE 1 TABLET BY MOUTH EVERY DAY (NEED REFILLS) 11/14/13  Yes Leonie Man, MD  valACYclovir (VALTREX) 1000 MG tablet Take 1,000 mg by mouth 3 (three) times daily as needed.    Historical Provider, MD    Family History  Problem Relation Age of Onset  . Cancer Mother     History   Social History  . Marital Status: Married    Spouse Name: N/A    Number of Children: N/A  . Years of Education: N/A   Social History Main Topics  . Smoking status: Former Smoker    Types: Cigarettes    Quit date: 10/25/1982  . Smokeless tobacco: None  . Alcohol Use: No  . Drug Use: No  . Sexual Activity: None   Other Topics Concern  . None  Social History Narrative    He is a widowed father of 26, grandfather of 8, still working. Does not smoke, quit 30   years ago and does not drink alcohol. He still works in his Nurse, learning disability for [employer].    Review of Systems: A 12 point ROS discussed and pertinent positives are indicated in the HPI above.  All other systems are negative.  Review of Systems  Constitutional: Positive for fatigue. Negative for activity change and appetite change.  Respiratory: Positive for chest tightness. Negative for cough and shortness of breath.   Cardiovascular: Positive for chest pain.  Gastrointestinal: Negative for abdominal pain.  Musculoskeletal: Negative for back pain.  Neurological: Negative for weakness.  Psychiatric/Behavioral: Negative for behavioral problems and  confusion.    Vital Signs: BP 137/61 mmHg  Pulse 60  Temp(Src) 97.4 F (36.3 C) (Oral)  Resp 16  Ht 6\' 2"  (1.88 m)  Wt 92.08 kg (203 lb)  BMI 26.05 kg/m2  SpO2 98%  Physical Exam  Constitutional: He is oriented to person, place, and time.  Cardiovascular: Normal rate, regular rhythm and normal heart sounds.   No murmur heard. Pulmonary/Chest: Breath sounds normal.  Abdominal: Soft. Bowel sounds are normal. There is no tenderness.  Musculoskeletal: Normal range of motion.  Neurological: He is alert and oriented to person, place, and time.  Skin: Skin is warm and dry.  Psychiatric: He has a normal mood and affect. Judgment and thought content normal.  Nursing note and vitals reviewed.   Imaging: Nm Pet Image Initial (pi) Skull Base To Thigh  07/03/2014   CLINICAL DATA:  Initial treatment strategy for right lung mass.  EXAM: NUCLEAR MEDICINE PET SKULL BASE TO THIGH  TECHNIQUE: 10.9 mCi F-18 FDG was injected intravenously. Full-ring PET imaging was performed from the skull base to thigh after the radiotracer. CT data was obtained and used for attenuation correction and anatomic localization.  FASTING BLOOD GLUCOSE:  Value: 83 mg/dl  COMPARISON:  None available  FINDINGS: NECK  No hypermetabolic lymph nodes in the neck.  CHEST  Within the left lower lobe there is a 5.3 x 4.7 cm hypermetabolic mass. This mass is intensely hypermetabolic with SUV equals 45.8. The mass abuts the pleural along the descending thoracic aorta. There are no hypermetabolic mediastinal lymph nodes. No supraclavicular lymph nodes which are metabolic.  There is free calcified granulomatous nodules within the right lung.  ABDOMEN/PELVIS  No abnormal hypermetabolic activity within the liver, pancreas, adrenal glands, or spleen. No hypermetabolic lymph nodes in the abdomen or pelvis.  SKELETON  No focal hypermetabolic activity to suggest skeletal metastasis.  IMPRESSION: 1. Hypermetabolic left lower lobe mass abutting the  pleural surface consists with bronchogenic carcinoma. 2. No evidence of mediastinal metastasis or distant metastasis. 3. Staging by FDG PET imaging T2b N0 M0 These results will be called to the ordering clinician or representative by the Radiologist Assistant, and communication documented in the PACS or zVision Dashboard.   Electronically Signed   By: Suzy Bouchard M.D.   On: 07/03/2014 09:35    Labs:  CBC:  Recent Labs  07/25/14 0913  WBC 7.1  HGB 14.4  HCT 42.7  PLT 214    COAGS:  Recent Labs  07/25/14 0913  INR 1.00  APTT 31    BMP: No results for input(s): NA, K, CL, CO2, GLUCOSE, BUN, CALCIUM, CREATININE, GFRNONAA, GFRAA in the last 8760 hours.  Invalid input(s): CMP  LIVER FUNCTION TESTS: No results for input(s): BILITOT,  AST, ALT, ALKPHOS, PROT, ALBUMIN in the last 8760 hours.  TUMOR MARKERS: No results for input(s): AFPTM, CEA, CA199, CHROMGRNA in the last 8760 hours.  Assessment and Plan:  Left lung mass; +PET Now scheduled for biopsy of same Pt aware of procedure benefits and risks and agreeable to proceed Consent signed andin chart  Thank you for this interesting consult.  I greatly enjoyed meeting Jeremy Deleon and look forward to participating in their care.    I spent a total of 20 minutes face to face in clinical consultation, greater than 50% of which was counseling/coordinating care for L lung mass bx  Signed: Deunta Beneke A 07/25/2014, 11:05 AM

## 2014-08-02 ENCOUNTER — Other Ambulatory Visit: Payer: Self-pay | Admitting: *Deleted

## 2014-08-02 ENCOUNTER — Encounter: Payer: Self-pay | Admitting: Cardiothoracic Surgery

## 2014-08-02 ENCOUNTER — Institutional Professional Consult (permissible substitution) (INDEPENDENT_AMBULATORY_CARE_PROVIDER_SITE_OTHER): Payer: Medicare Other | Admitting: Cardiothoracic Surgery

## 2014-08-02 VITALS — BP 106/66 | HR 76 | Resp 20 | Ht 74.0 in | Wt 203.0 lb

## 2014-08-02 DIAGNOSIS — C3432 Malignant neoplasm of lower lobe, left bronchus or lung: Secondary | ICD-10-CM

## 2014-08-02 DIAGNOSIS — R51 Headache: Secondary | ICD-10-CM

## 2014-08-02 DIAGNOSIS — R519 Headache, unspecified: Secondary | ICD-10-CM

## 2014-08-02 DIAGNOSIS — R918 Other nonspecific abnormal finding of lung field: Secondary | ICD-10-CM

## 2014-08-02 NOTE — Progress Notes (Signed)
PCP is [name], MD Referring Provider is Jacki Cones, MD  Chief Complaint  Patient presents with  . Lung Cancer    Surgical eval on lung mass, CT BX 07/25/14, PET Scan 06/29/14      IRJ:JOACZYS examined, chest CT scan, PET scan, PFTs, and needle biopsy of left lower lobe mass pathology report reviewed  81 year old Caucasian male in good health with a remote history of smoking presents after recently being diagnosed with a 5 cm mass in the medial segment of the left lower lobe. His initial symptoms included soreness of his anterior chest wall which have since resolved. His primary care physician ordered a CT scan which showed a mass. PET scan shows this to be hypermetabolic SUV of 06.3, no metabolic activity in the mediastinal nodes or other distant sites or other areas in the lungs. The patient denies any systemic symptoms of weight loss headache hemoptysis night sweats.  The patient underwent bronchoscopy by his pulmonologist Dr Michela Pitcher. Bronchoscopy showed extrinsic compression of the left lower lobe with endobronchial biopsies were negative. He subsequently had a needle biopsy which diagnosed the mass as squamous cell carcinoma.  A brain MRI is pending to complete clinical staging . Since the patient appears to be a surgical candidate despite his advanced age a thoracic surgical evaluation was requested. 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 He has a history of coronary disease status post PCI-stent of proximal LAD in 2009 followed by Dr. Ellyn Hack without evidence of recurrent angina and normal LV EF by most recent echocardiogram.  The patient has had previous repair of umbilical hernia under general anesthesia for which there were no complications. Past Medical History  Diagnosis Date  . CAD S/P percutaneous coronary angioplasty January 2006    Abnormal Myoview with inferolateral ischemia: --> 80% prox LAD lesion PCI: Taxus DES 3.0 mm  x 16 mm-;; Myoview June 2009 no ischemia or infarction with attenuation artifact  . Hypertension   . Dyslipidemia, goal LDL below 70     on simvastatin and tricor  . Osteoarthritis   . BPH (benign prostatic hyperplasia)     Past Surgical History  Procedure Laterality Date  . Nm myoview ltd  07/2004    inferolatera wall ischemia  . Nm myocar perf wall motion  January 06, 2008     treadmill  myoview - tissue attenuation but no ischemia or infarction  . Doppler echocardiography  June 18 ,2009    mild concentric LVH, NORMAL ef, MILDLY  DILATED LEFT ATRIUM  AND MILD SCLEROTIC  AORTIC  VALVE AND OTHERWISE  NORMAL  . Cardiac catheterization  07/2004    80% proximal LAD lesion--PCI, EF 60%  . Percutaneous coronary stent intervention (pci-s)  2/27/ 2006    TAXUS DES 3.0 mm x 16 mm stent ->  PROX  LAD  . Renal doppler  05/03/2007    rgt and lft renal arteries normal patency; rgt and lft kidneys normal size  . Umbilical hernia repair    . Appendectomy      Family History  Problem Relation Age of Onset  . Cancer Mother   . Heart disease Mother   . Heart disease Father   . Parkinsonism Brother     Social History History  Substance Use Topics  . Smoking status: Former Smoker    Types: Cigarettes    Quit date: 10/25/1982  . Smokeless tobacco: Not on file  . Alcohol Use: No    Current Outpatient  Prescriptions  Medication Sig Dispense Refill  . aspirin EC 325 MG tablet Take 325 mg by mouth daily.    . fenofibrate (TRICOR) 145 MG tablet TAKE 1 TABLET BY MOUTH EVERY DAY (Patient taking differently: Take 145 mg by mouth daily. ) 90 tablet 3  . isosorbide mononitrate (IMDUR) 30 MG 24 hr tablet Take 1 tablet (30 mg total) by mouth daily. 90 tablet 3  . metoprolol succinate (TOPROL XL) 25 MG 24 hr tablet Take 1 tablet (25 mg total) by mouth daily. 90 tablet 3  . simvastatin (ZOCOR) 40 MG tablet TAKE 1 TABLET EVERY DAY (Patient taking differently: Take 40 mg by mouth daily at 6 PM. TAKE 1 TABLET  EVERY DAY) 90 tablet 3  . traMADol (ULTRAM) 50 MG tablet Take 50 mg by mouth every 6 (six) hours as needed for moderate pain.    . valsartan-hydrochlorothiazide (DIOVAN-HCT) 160-12.5 MG per tablet TAKE 1 TABLET BY MOUTH EVERY DAY (NEED REFILLS) 90 tablet 3   No current facility-administered medications for this visit.    Allergies  Allergen Reactions  . Niaspan [Niacin Er] Other (See Comments)    flushing    Review of Systems   No family history lung cancer No history thoracic trauma, rib fracture or pneumothorax The patient is right-hand dominant No history DVT or peripheral rash or disease No history of TIA or stroke No history of bleeding diathesis, no history of diabetes No recent history of angina or CHF  BP 106/66 mmHg  Pulse 76  Resp 20  Ht 6\' 2"  (1.88 m)  Wt 203 lb (92.08 kg)  BMI 26.05 kg/m2  SpO2 98% Physical Exam   General appearance-well-developed Caucasian male accompanied by his daughter no acute distress. Oxygen saturation at rest 98%--after ambulation 200 feet oxygen saturation 97% HEENT-normocephalic pupils equal full upper and lower dental plates Neck-no JVD mass adenopathy or bruit Thorax-no deformity or tenderness, needle puncture site left posterior thorax below the tip of the scapula Cardiac-regular rhythm without murmur or gallop Abdomen-soft nontender without pulsatile mass, organomegaly Extremities-no clubbing cyanosis edema or tenderness Vascular-palpable pulses all extremities mild superficial varicosities right lower extremity Neurologic-alert and oriented no focal motor deficit, normal gait   Diagnostic Tests: CT scan, PET scan, PFTs reviewed FEV1  3.2, 93% predicted FVC 4.4 96% of predicted, DLCO 84% predicted  Impression: 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 Procedure,risks and expected recovery were reviewed in detail with the patient and his daughter.  Plan:left lower lobectomy January 21 at [facility] hospital

## 2014-08-03 ENCOUNTER — Ambulatory Visit (HOSPITAL_COMMUNITY)
Admission: RE | Admit: 2014-08-03 | Discharge: 2014-08-03 | Disposition: A | Discharge status: Home / Self Care | Payer: Medicare Other | Source: Ambulatory Visit | Attending: Cardiothoracic Surgery | Admitting: Cardiothoracic Surgery

## 2014-08-03 ENCOUNTER — Encounter: Payer: Self-pay | Admitting: Cardiology

## 2014-08-03 ENCOUNTER — Ambulatory Visit (INDEPENDENT_AMBULATORY_CARE_PROVIDER_SITE_OTHER): Payer: Medicare Other | Admitting: Cardiology

## 2014-08-03 VITALS — BP 112/78 | HR 65 | Ht 74.0 in | Wt 200.0 lb

## 2014-08-03 DIAGNOSIS — C3490 Malignant neoplasm of unspecified part of unspecified bronchus or lung: Secondary | ICD-10-CM | POA: Diagnosis present

## 2014-08-03 DIAGNOSIS — E785 Hyperlipidemia, unspecified: Secondary | ICD-10-CM

## 2014-08-03 DIAGNOSIS — R51 Headache: Secondary | ICD-10-CM | POA: Insufficient documentation

## 2014-08-03 DIAGNOSIS — R519 Headache, unspecified: Secondary | ICD-10-CM

## 2014-08-03 DIAGNOSIS — I10 Essential (primary) hypertension: Secondary | ICD-10-CM

## 2014-08-03 DIAGNOSIS — R918 Other nonspecific abnormal finding of lung field: Secondary | ICD-10-CM

## 2014-08-03 DIAGNOSIS — Z01810 Encounter for preprocedural cardiovascular examination: Secondary | ICD-10-CM | POA: Insufficient documentation

## 2014-08-03 DIAGNOSIS — C3492 Malignant neoplasm of unspecified part of left bronchus or lung: Secondary | ICD-10-CM

## 2014-08-03 DIAGNOSIS — I2510 Atherosclerotic heart disease of native coronary artery without angina pectoris: Secondary | ICD-10-CM

## 2014-08-03 DIAGNOSIS — Z9861 Coronary angioplasty status: Secondary | ICD-10-CM

## 2014-08-03 LAB — POCT I-STAT CREATININE: Creatinine, Ser: 1.3 mg/dL (ref 0.50–1.35)

## 2014-08-03 MED ORDER — GADOBENATE DIMEGLUMINE 529 MG/ML IV SOLN
19.0000 mL | Freq: Once | INTRAVENOUS | Status: AC | PRN
  Administered 2014-08-03: 19 mL via INTRAVENOUS

## 2014-08-03 NOTE — Assessment & Plan Note (Signed)
Jeremy Deleon has a history of single-vessel CAD noted on cardiac catheterization done in 2006 for an abnormal Myoview. Since that time, he has had negative Myoview and is not had any recurrence of anginal symptoms. He is very active and able to achieve at least 7-8 metabolic equivalents.  PREOPERATIVE CARDIAC RISK ASSESSMENT   Revised Cardiac Risk Index:  High Risk Surgery: yes; intrathoracic  Defined as Intraperitoneal, intrathoracic or suprainguinal vascular  Active CAD: no; history of CAD, but not active with no active symptoms.  CHF: no; has never had CHF  Cerebrovascular Disease: no;   Diabetes: no; On Insulin: no  CKD (Cr >~ 2): no;   Total: 1 Estimated Risk of Adverse Outcome: LOW  Estimated Risk of MI, PE, VF/VT (Cardiac Arrest), Complete Heart Block: 0.9 %  ACC/AHA Guidelines for "Clearance":  Step 1 - Need for Emergency Surgery: No: Not emergent  If Yes - go straight to OR with perioperative surveillance  Step 2 - Active Cardiac Conditions (Unstable Angina, Decompensated HF, Significant  Arrhytmias - Complete HB, Mobitz II, Symptomatic VT or SVT, Severe Aortic Stenosis - mean gradient > 40 mmHg, Valve area < 1.0 cm2):   No: No active symptoms. Stent was long ago  If Yes - Evaluate & Treat per ACC/AHA Guidelines  Step 3 -  Low Risk Surgery: No: High  If Yes --> proceed to OR  If No --> Step 4  Step 4 - Functional Capacity >= 4 METS without symptoms: Yes  If Yes --> proceed to OR  If No --> Step 5  Step 5 --  Clinical Risk Factors (CRF)   3 or more: No:   If Yes -- assess Surgical Risk, --   (High Risk Non-cardiac), Intraabdominal or thoracic vascular surgery consider testing if it will change management.  Intermediate Risk: Proceed to OR with HR control, or consider testing if it will change management  1-2 or more CRFs: Yes  If Yes -- assess Surgical Risk, --   (High Risk Non-cardiac), Intraabdominal or thoracic vascular surgery --> Proceed to OR,  or consider testing if it will change management.  Intermediate Risk: Proceed to OR with HR control, or consider testing if it will change management  Based on these criteria with ACC/AHA and Revised Cardiac Risk Index [name] criteria), the patient would be considered a low risk patient for a high risk surgery. While he does have history of CAD, it is not active with no symptoms. Intranasal dramatic patient, stress test evaluation with the ability to achieve greater than 4 metabolic equivalents is not required as it would not alter routine management.  Based on this assessment, recommendation will be to proceed to the OR without any additional cardiac evaluation. He is not having active anginal symptoms or CHF. I would continue his beta blocker for additional risk factor reduction. He is also on a nitrate which should be continued perioperatively provided blood pressure room stable. Would also continue statin for endothelial stabilization.

## 2014-08-03 NOTE — Assessment & Plan Note (Signed)
As noted above. No active symptoms. Taking aspirin, statin plus fenofibrate and beta blocker. He is also on ARB. With his level of activity able to achieve at least 7-8 metabolic equivalents, no need for surveillance ischemic evaluation.

## 2014-08-03 NOTE — Assessment & Plan Note (Signed)
Well-controlled. Toprol was started last visit and is well tolerated. This should be continued perioperatively.

## 2014-08-03 NOTE — Assessment & Plan Note (Signed)
On statin plus fenofibrate but no signs of myalgias. As previously noted, would consider switching to atorvastatin from simvastatin. Defer to PCP who is monitoring labs.

## 2014-08-03 NOTE — Progress Notes (Signed)
PATIENT: Jeremy Deleon MRN: [id number]  DOB: 12/28/1932   DOV:08/03/2014 PCP: [name], MD  Clinic Note: Chief Complaint  Patient presents with  . Surgical Clearance    VATS on 08/10/14  . 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 Coronary Artery Disease    Status post PCI   HPI: Jeremy Deleon is a 81 y.o.  male with a PMH of single vessel CAD s/p PCI who presents today for pre-operative Cardiovascualr Evaluation for planned VATS Surgery for Lung Tumor (Dr. Prescott Gum).   He was recently diagnosed the 5 cm mass in the left lower lobe that was found on a CT scan ordered to evaluate anterior chest soreness that has since resolved. He has not had any resting or exertional anginal symptoms since his PCI in 2006. The mass was confirmed by PET scan to be hypermetabolic but no mediastinal nodes were noted. While endobronchial biopsies were negative, needle biopsy diagnosed squamous cell carcinoma. He was seen by Dr. Tharon Aquas Trigt for TCTS surgical consultation -- with a tentatively planned surgery for January 21.  Interval History: Besides the very brief episode of chest discomfort he had leading to a CT scan, he has not had any anginal symptoms. He felt like he had a muscle strain in his chest wall after lifting some heavy boxes. He is very active exercise regularly, denies any resting or exertional chest tightness, pressure or dyspnea to suggest angina. He does not have any heart failure symptoms of PND, orthopnea or edema.  He states that he is able to walk up and down several flights of steps without having any dyspnea or angina. He says he can easily do the amount of activity that we would be consistent with probably 7-8 metabolic equivalents.   Cardiovascular ROS: negative for - irregular heartbeat, loss of consciousness, murmur, orthopnea, palpitations, paroxysmal nocturnal dyspnea, rapid heart rate, shortness of breath or Lightheadedness/dizziness, TIA/amaurosis fugax,  melena, hematochezia, hematuria, epistaxis, claudication:  Past Medical History  Diagnosis Date  . CAD S/P percutaneous coronary angioplasty January 2006    Abnormal Myoview with inferolateral ischemia: --> 80% prox LAD lesion PCI: Taxus DES 3.0 mm x 16 mm-;; Myoview June 2009 no ischemia or infarction with attenuation artifact  . Hypertension   . Dyslipidemia, goal LDL below 70     on simvastatin and tricor  . Osteoarthritis   . BPH (benign prostatic hyperplasia)   . Squamous cell lung cancer : Left lower lobe  08/03/2014    Bronchoscopy showed extrinsic compression of the left lower lobe with endobronchial biopsies were negative. He subsequently had a needle biopsy which diagnosed the mass as squamous cell carcinoma    Prior Cardiac Evaluation and Past Surgical History: Procedure Laterality Date  . Nm myoview ltd  07/2004    inferolatera wall ischemia  . Nm myocar perf wall motion  January 06, 2008     treadmill  myoview - tissue attenuation but no ischemia or infarction  . Doppler echocardiography  June 18 ,2009    mild concentric LVH, NORMAL ef, MILDLY  DILATED LEFT ATRIUM  AND MILD SCLEROTIC  AORTIC  VALVE AND OTHERWISE  NORMAL  . Cardiac catheterization  07/2004    80% proximal LAD lesion--PCI, EF 60%  . Percutaneous coronary stent intervention (pci-s)  2/27/ 2006    TAXUS DES 3.0 mm x 16 mm stent ->  PROX  LAD  . Renal doppler  05/03/2007    rgt and  lft renal arteries normal patency; rgt and lft kidneys normal size   Allergies  Allergen Reactions  . Niaspan [Niacin Er] Other (See Comments)    flushing    Current outpatient prescriptions:  .  aspirin EC 325 MG tablet, Take 325 mg by mouth daily., Disp: , Rfl:  .  fenofibrate (TRICOR) 145 MG tablet, TAKE 1 TABLET BY MOUTH EVERY DAY (Patient taking differently: Take 145 mg by mouth daily. ), Disp: 90 tablet, Rfl: 3 .  isosorbide mononitrate (IMDUR) 30 MG 24 hr tablet, Take 1 tablet (30 mg total) by mouth daily., Disp: 90 tablet,  Rfl: 3 .  metoprolol succinate (TOPROL XL) 25 MG 24 hr tablet, Take 1 tablet (25 mg total) by mouth daily., Disp: 90 tablet, Rfl: 3 .  simvastatin (ZOCOR) 40 MG tablet, TAKE 1 TABLET EVERY DAY (Patient taking differently: Take 40 mg by mouth daily at 6 PM. TAKE 1 TABLET EVERY DAY), Disp: 90 tablet, Rfl: 3 .  traMADol (ULTRAM) 50 MG tablet, Take 50 mg by mouth every 6 (six) hours as needed for moderate pain., Disp: , Rfl:  .  valsartan-hydrochlorothiazide (DIOVAN-HCT) 160-12.5 MG per tablet, TAKE 1 TABLET BY MOUTH EVERY DAY (NEED REFILLS), Disp: 90 tablet, Rfl: 3  History   Social History Narrative    He is a widowed father of 76, grandfather of 66, still working. Does not smoke, quit 30   years ago and does not drink alcohol. He still works in his Nurse, learning disability for [employer].   ROS: A comprehensive Review of Systems - was performed Review of Systems  Constitutional: Negative for fever, chills and malaise/fatigue.  HENT: Negative for nosebleeds.   Respiratory: Negative for cough, hemoptysis, shortness of breath and wheezing.   Cardiovascular: Negative for claudication.  Gastrointestinal: Negative for blood in stool and melena.  Genitourinary: Negative for hematuria.  Musculoskeletal: Positive for joint pain and neck pain. Negative for myalgias.  Neurological: Positive for dizziness (Mild intermittent orthostatic sounding dizziness).  Endo/Heme/Allergies: Does not bruise/bleed easily.  Psychiatric/Behavioral: Negative for depression.  All other systems reviewed and are negative.  PHYSICAL EXAM BP 112/78 mmHg  Pulse 65  Ht 6\' 2"  (1.88 m)  Wt 200 lb (90.719 kg)  BMI 25.67 kg/m2 General: he is a very pleasant, healthy-appearing, elderly gentleman, NAD, A&O x3. He is somewhat hard of hearing.  HEENT: NCAT. EOMI. MMM. Anicteric sclerae.  Neck: Supple with no LAN, JVD, or carotid bruit. Heart: RRR. Normal S1, S2. No M/R/G. Nondisplaced PMI.  Lungs: CTAB. Nonlabored.  Normal effort. Good air movement. Abdomen: Soft/NT/ND/NABS. No HSM.  Extremities: No C/C. Trace to 1+ LE edema bilaterally. Normal strength 5/5 throughout   Adult ECG Report  Rate: 65 ;  Rhythm: normal sinus rhythm  QRS Axis: 38;  PR Interval: 198;  QRS Duration: 98; QTc: 417; Voltages: Normal  Conduction Disturbances: none;  Other Abnormalities: none  Narrative Interpretation: Normal EKG  Recent Labs: Checked by PCP; lipid panel not available.  ASSESSMENT / PLAN: Preoperative cardiovascular examination Mr. Kathol has a history of single-vessel CAD noted on cardiac catheterization done in 2006 for an abnormal Myoview. Since that time, he has had negative Myoview and is not had any recurrence of anginal symptoms. He is very active and able to achieve at least 7-8 metabolic equivalents.  PREOPERATIVE CARDIAC RISK ASSESSMENT   Revised Cardiac Risk Index:  High Risk Surgery: yes; intrathoracic  Defined as Intraperitoneal, intrathoracic or suprainguinal vascular  Active CAD: no; history of CAD, but not  active with no active symptoms.  CHF: no; has never had CHF  Cerebrovascular Disease: no;   Diabetes: no; On Insulin: no  CKD (Cr >~ 2): no;   Total: 1 Estimated Risk of Adverse Outcome: LOW  Estimated Risk of MI, PE, VF/VT (Cardiac Arrest), Complete Heart Block: 0.9 %  ACC/AHA Guidelines for "Clearance":  Step 1 - Need for Emergency Surgery: No: Not emergent  If Yes - go straight to OR with perioperative surveillance  Step 2 - Active Cardiac Conditions (Unstable Angina, Decompensated HF, Significant  Arrhytmias - Complete HB, Mobitz II, Symptomatic VT or SVT, Severe Aortic Stenosis - mean gradient > 40 mmHg, Valve area < 1.0 cm2):   No: No active symptoms. Stent was long ago  If Yes - Evaluate & Treat per ACC/AHA Guidelines  Step 3 -  Low Risk Surgery: No: High  If Yes --> proceed to OR  If No --> Step 4  Step 4 - Functional Capacity >= 4 METS without symptoms:  Yes  If Yes --> proceed to OR  If No --> Step 5  Step 5 --  Clinical Risk Factors (CRF)   3 or more: No:   If Yes -- assess Surgical Risk, --   (High Risk Non-cardiac), Intraabdominal or thoracic vascular surgery consider testing if it will change management.  Intermediate Risk: Proceed to OR with HR control, or consider testing if it will change management  1-2 or more CRFs: Yes  If Yes -- assess Surgical Risk, --   (High Risk Non-cardiac), Intraabdominal or thoracic vascular surgery --> Proceed to OR, or consider testing if it will change management.  Intermediate Risk: Proceed to OR with HR control, or consider testing if it will change management  Based on these criteria with ACC/AHA and Revised Cardiac Risk Index [name] criteria), the patient would be considered a low risk patient for a high risk surgery. While he does have history of CAD, it is not active with no symptoms. Intranasal dramatic patient, stress test evaluation with the ability to achieve greater than 4 metabolic equivalents is not required as it would not alter routine management.  Based on this assessment, recommendation will be to proceed to the OR without any additional cardiac evaluation. He is not having active anginal symptoms or CHF. I would continue his beta blocker for additional risk factor reduction. He is also on a nitrate which should be continued perioperatively provided blood pressure room stable. Would also continue statin for endothelial stabilization.   CAD S/P percutaneous coronary angioplasty -- Taxus DES in Prox LAD As noted above. No active symptoms. Taking aspirin, statin plus fenofibrate and beta blocker. He is also on ARB. With his level of activity able to achieve at least 7-8 metabolic equivalents, no need for surveillance ischemic evaluation.   Dyslipidemia, goal LDL below 70 On statin plus fenofibrate but no signs of myalgias. As previously noted, would consider switching to  atorvastatin from simvastatin. Defer to PCP who is monitoring labs.    Essential hypertension Well-controlled. Toprol was started last visit and is well tolerated. This should be continued perioperatively.     No orders of the defined types were placed in this encounter.    Followup: 3 months  Izzah Pasqua W. Ellyn Hack, M.D., M.S. Interventional Cardiology [facility]

## 2014-08-03 NOTE — Patient Instructions (Signed)
You are cleared for your surgery next week.  Your physician recommends that you schedule a follow-up appointment in: 3 months with Dr.Harding.

## 2014-08-08 ENCOUNTER — Ambulatory Visit (HOSPITAL_COMMUNITY)
Admission: RE | Admit: 2014-08-08 | Discharge: 2014-08-08 | Disposition: A | Discharge status: Home / Self Care | Payer: Medicare Other | Source: Ambulatory Visit | Attending: Cardiothoracic Surgery | Admitting: Cardiothoracic Surgery

## 2014-08-08 ENCOUNTER — Encounter (HOSPITAL_COMMUNITY): Payer: Self-pay

## 2014-08-08 ENCOUNTER — Encounter (HOSPITAL_COMMUNITY)
Admission: RE | Admit: 2014-08-08 | Discharge: 2014-08-08 | Disposition: A | Discharge status: Home / Self Care | Payer: Medicare Other | Source: Ambulatory Visit | Attending: Cardiothoracic Surgery | Admitting: Cardiothoracic Surgery

## 2014-08-08 VITALS — BP 133/63 | HR 68 | Temp 97.8°F | Resp 20 | Ht 74.0 in | Wt 195.7 lb

## 2014-08-08 DIAGNOSIS — Z01811 Encounter for preprocedural respiratory examination: Secondary | ICD-10-CM | POA: Insufficient documentation

## 2014-08-08 DIAGNOSIS — C3492 Malignant neoplasm of unspecified part of left bronchus or lung: Secondary | ICD-10-CM | POA: Insufficient documentation

## 2014-08-08 DIAGNOSIS — C3432 Malignant neoplasm of lower lobe, left bronchus or lung: Secondary | ICD-10-CM

## 2014-08-08 HISTORY — DX: Gastro-esophageal reflux disease without esophagitis: K21.9

## 2014-08-08 LAB — URINALYSIS, ROUTINE W REFLEX MICROSCOPIC
Bilirubin Urine: NEGATIVE
Glucose, UA: NEGATIVE mg/dL
Ketones, ur: NEGATIVE mg/dL
Nitrite: NEGATIVE
Protein, ur: NEGATIVE mg/dL
Specific Gravity, Urine: 1.021 (ref 1.005–1.030)
Urobilinogen, UA: 0.2 mg/dL (ref 0.0–1.0)
pH: 6 (ref 5.0–8.0)

## 2014-08-08 LAB — COMPREHENSIVE METABOLIC PANEL
ALT: 11 U/L (ref 0–53)
AST: 24 U/L (ref 0–37)
Albumin: 3.8 g/dL (ref 3.5–5.2)
Alkaline Phosphatase: 63 U/L (ref 39–117)
Anion gap: 8 (ref 5–15)
BUN: 24 mg/dL — ABNORMAL HIGH (ref 6–23)
CO2: 26 mmol/L (ref 19–32)
Calcium: 9.5 mg/dL (ref 8.4–10.5)
Chloride: 106 mEq/L (ref 96–112)
Creatinine, Ser: 1.22 mg/dL (ref 0.50–1.35)
GFR calc Af Amer: 63 mL/min — ABNORMAL LOW (ref >90)
GFR calc non Af Amer: 55 mL/min — ABNORMAL LOW (ref >90)
Glucose, Bld: 96 mg/dL (ref 70–99)
Potassium: 3.5 mmol/L (ref 3.5–5.1)
Sodium: 140 mmol/L (ref 135–145)
Total Bilirubin: 0.7 mg/dL (ref 0.3–1.2)
Total Protein: 6.8 g/dL (ref 6.0–8.3)

## 2014-08-08 LAB — BLOOD GAS, ARTERIAL
Acid-Base Excess: 2.4 mmol/L — ABNORMAL HIGH (ref 0.0–2.0)
Bicarbonate: 26.5 mEq/L — ABNORMAL HIGH (ref 20.0–24.0)
Drawn by: 428831
FIO2: 0.21 %
O2 Saturation: 96.2 %
Patient temperature: 98.6
TCO2: 27.7 mmol/L (ref 0–100)
pCO2 arterial: 41.1 mmHg (ref 35.0–45.0)
pH, Arterial: 7.425 (ref 7.350–7.450)
pO2, Arterial: 84.9 mmHg (ref 80.0–100.0)

## 2014-08-08 LAB — SURGICAL PCR SCREEN
MRSA, PCR: NEGATIVE
Staphylococcus aureus: NEGATIVE

## 2014-08-08 LAB — ABO/RH: ABO/RH(D): O NEG

## 2014-08-08 LAB — PROTIME-INR
INR: 1 (ref 0.00–1.49)
Prothrombin Time: 13.3 seconds (ref 11.6–15.2)

## 2014-08-08 LAB — URINE MICROSCOPIC-ADD ON

## 2014-08-08 LAB — APTT: aPTT: 30 seconds (ref 24–37)

## 2014-08-08 LAB — CBC
HCT: 42.1 % (ref 39.0–52.0)
Hemoglobin: 14.1 g/dL (ref 13.0–17.0)
MCH: 29.6 pg (ref 26.0–34.0)
MCHC: 33.5 g/dL (ref 30.0–36.0)
MCV: 88.3 fL (ref 78.0–100.0)
Platelets: 217 10*3/uL (ref 150–400)
RBC: 4.77 MIL/uL (ref 4.22–5.81)
RDW: 14 % (ref 11.5–15.5)
WBC: 6.9 10*3/uL (ref 4.0–10.5)

## 2014-08-08 NOTE — Progress Notes (Signed)
Spoke with Dr. Everlene Balls who stated patient did not need to stop aspirin. Patient instructed.

## 2014-08-08 NOTE — Progress Notes (Signed)
   08/08/14 0910  OBSTRUCTIVE SLEEP APNEA  Have you ever been diagnosed with sleep apnea through a sleep study? No  Do you snore loudly (loud enough to be heard through closed doors)?  0  Has anyone observed you stop breathing during your sleep? 0  Do you have, or are you being treated for high blood pressure? 1  BMI more than 35 kg/m2? 0  Age over 79 years old? 1  Neck circumference greater than 40 cm/16 inches? 1 (17)  Gender: 1  Obstructive Sleep Apnea Score 4  Score 4 or greater  Results sent to PCP

## 2014-08-08 NOTE — Pre-Procedure Instructions (Signed)
Jeremy Deleon  08/08/2014   Your procedure is scheduled on: Thursday, August 10, 2014  Report to [facility] Admitting at 6:00 AM.  Call this number if you have problems the morning of surgery: [phone]   Remember:   Do not eat food or drink liquids after midnight Wednesday, August 09, 2014   Take these medicines the morning of surgery with A SIP OF WATER: isosorbide mononitrate (IMDUR), metoprolol succinate (TOPROL XL) if needed:traMADol (ULTRAM)  for pain  Stop taking Aspirin, vitamins, and herbal medications. Do not take any NSAIDs ie: Ibuprofen, Advil, Naproxen or any medication containing Aspirin.   Do not wear jewelry, make-up or nail polish.  Do not wear lotions, powders, or perfumes. You may not wear deodorant.  Do not shave 48 hours prior to surgery.  Do not bring valuables to the hospital.  [facility] is not responsible for any belongings or valuables.               Contacts, dentures or bridgework may not be worn into surgery.  Leave suitcase in the car. After surgery it may be brought to your room.  For patients admitted to the hospital, discharge time is determined by your treatment team.               Patients discharged the day of surgery will not be allowed to drive home.  Name and phone number of your driver:  Special Instructions:  Special Instructions:Special Instructions: [facility] - Preparing for Surgery  Before surgery, you can play an important role.  Because skin is not sterile, your skin needs to be as free of germs as possible.  You can reduce the number of germs on you skin by washing with CHG (chlorahexidine gluconate) soap before surgery.  CHG is an antiseptic cleaner which kills germs and bonds with the skin to continue killing germs even after washing.  Please DO NOT use if you have an allergy to CHG or antibacterial soaps.  If your skin becomes reddened/irritated stop using the CHG and inform your nurse when you arrive at Short Stay.  Do  not shave (including legs and underarms) for at least 48 hours prior to the first CHG shower.  You may shave your face.  Please follow these instructions carefully:   1.  Shower with CHG Soap the night before surgery and the morning of Surgery.  2.  If you choose to wash your hair, wash your hair first as usual with your normal shampoo.  3.  After you shampoo, rinse your hair and body thoroughly to remove the Shampoo.  4.  Use CHG as you would any other liquid soap.  You can apply chg directly  to the skin and wash gently with scrungie or a clean washcloth.  5.  Apply the CHG Soap to your body ONLY FROM THE NECK DOWN.  Do not use on open wounds or open sores.  Avoid contact with your eyes, ears, mouth and genitals (private parts).  Wash genitals (private parts) with your normal soap.  6.  Wash thoroughly, paying special attention to the area where your surgery will be performed.  7.  Thoroughly rinse your body with warm water from the neck down.  8.  DO NOT shower/wash with your normal soap after using and rinsing off the CHG Soap.  9.  Pat yourself dry with a clean towel.            10.  Wear clean pajamas.  11.  Place clean sheets on your bed the night of your first shower and do not sleep with pets.  Day of Surgery  Do not apply any lotions/deodorants the morning of surgery.  Please wear clean clothes to the hospital/surgery center.   Please read over the following fact sheets that you were given: Pain Booklet, Coughing and Deep Breathing, Blood Transfusion Information, MRSA Information and Surgical Site Infection Prevention

## 2014-08-09 MED ORDER — CEFUROXIME SODIUM 1.5 G IJ SOLR
1.5000 g | INTRAMUSCULAR | Status: AC
  Administered 2014-08-10: 1.5 g via INTRAVENOUS
  Filled 2014-08-09: qty 1.5

## 2014-08-10 ENCOUNTER — Inpatient Hospital Stay (HOSPITAL_COMMUNITY): Payer: Medicare Other | Admitting: Anesthesiology

## 2014-08-10 ENCOUNTER — Encounter (HOSPITAL_COMMUNITY)
Admission: RE | Disposition: A | Discharge status: Home Health | Payer: Self-pay | Source: Ambulatory Visit | Attending: Cardiothoracic Surgery

## 2014-08-10 ENCOUNTER — Inpatient Hospital Stay (HOSPITAL_COMMUNITY)
Admission: RE | Admit: 2014-08-10 | Discharge: 2014-08-16 | DRG: 164 | Disposition: A | Discharge status: Home Health | Payer: Medicare Other | Source: Ambulatory Visit | Attending: Cardiothoracic Surgery | Admitting: Cardiothoracic Surgery

## 2014-08-10 ENCOUNTER — Inpatient Hospital Stay (HOSPITAL_COMMUNITY): Payer: Medicare Other

## 2014-08-10 DIAGNOSIS — I10 Essential (primary) hypertension: Secondary | ICD-10-CM | POA: Diagnosis present

## 2014-08-10 DIAGNOSIS — Z955 Presence of coronary angioplasty implant and graft: Secondary | ICD-10-CM | POA: Diagnosis not present

## 2014-08-10 DIAGNOSIS — C3432 Malignant neoplasm of lower lobe, left bronchus or lung: Secondary | ICD-10-CM | POA: Diagnosis not present

## 2014-08-10 DIAGNOSIS — C801 Malignant (primary) neoplasm, unspecified: Secondary | ICD-10-CM | POA: Diagnosis present

## 2014-08-10 DIAGNOSIS — Z7982 Long term (current) use of aspirin: Secondary | ICD-10-CM

## 2014-08-10 DIAGNOSIS — Z87891 Personal history of nicotine dependence: Secondary | ICD-10-CM | POA: Diagnosis not present

## 2014-08-10 DIAGNOSIS — Z902 Acquired absence of lung [part of]: Secondary | ICD-10-CM

## 2014-08-10 DIAGNOSIS — J939 Pneumothorax, unspecified: Secondary | ICD-10-CM | POA: Diagnosis not present

## 2014-08-10 DIAGNOSIS — E785 Hyperlipidemia, unspecified: Secondary | ICD-10-CM | POA: Diagnosis present

## 2014-08-10 DIAGNOSIS — K5900 Constipation, unspecified: Secondary | ICD-10-CM | POA: Diagnosis not present

## 2014-08-10 DIAGNOSIS — Z4682 Encounter for fitting and adjustment of non-vascular catheter: Secondary | ICD-10-CM

## 2014-08-10 DIAGNOSIS — I2510 Atherosclerotic heart disease of native coronary artery without angina pectoris: Secondary | ICD-10-CM | POA: Diagnosis not present

## 2014-08-10 DIAGNOSIS — Z9889 Other specified postprocedural states: Secondary | ICD-10-CM

## 2014-08-10 DIAGNOSIS — R0602 Shortness of breath: Secondary | ICD-10-CM

## 2014-08-10 DIAGNOSIS — E876 Hypokalemia: Secondary | ICD-10-CM | POA: Diagnosis not present

## 2014-08-10 HISTORY — PX: VIDEO ASSISTED THORACOSCOPY (VATS)/ LOBECTOMY: SHX6169

## 2014-08-10 HISTORY — PX: VIDEO BRONCHOSCOPY: SHX5072

## 2014-08-10 LAB — BLOOD GAS, ARTERIAL
Acid-Base Excess: 0.8 mmol/L (ref 0.0–2.0)
Bicarbonate: 25.2 mEq/L — ABNORMAL HIGH (ref 20.0–24.0)
O2 Content: 8 L/min
O2 Saturation: 98.3 %
Patient temperature: 97.5
TCO2: 26.5 mmol/L (ref 0–100)
pCO2 arterial: 41.5 mmHg (ref 35.0–45.0)
pH, Arterial: 7.397 (ref 7.350–7.450)
pO2, Arterial: 107 mmHg — ABNORMAL HIGH (ref 80.0–100.0)

## 2014-08-10 LAB — PREPARE RBC (CROSSMATCH)

## 2014-08-10 SURGERY — VIDEO ASSISTED THORACOSCOPY (VATS)/ LOBECTOMY
Anesthesia: General | Site: Chest

## 2014-08-10 MED ORDER — SODIUM CHLORIDE 0.9 % IJ SOLN: 9.0000 mL | INTRAMUSCULAR | Status: DC | PRN

## 2014-08-10 MED ORDER — DIPHENHYDRAMINE HCL 12.5 MG/5ML PO ELIX
12.5000 mg | ORAL_SOLUTION | Freq: Four times a day (QID) | ORAL | Status: DC | PRN
  Filled 2014-08-10: qty 5

## 2014-08-10 MED ORDER — BISACODYL 5 MG PO TBEC
10.0000 mg | DELAYED_RELEASE_TABLET | Freq: Every day | ORAL | Status: DC
  Administered 2014-08-11 – 2014-08-16 (×6): 10 mg via ORAL
  Filled 2014-08-10 (×6): qty 2

## 2014-08-10 MED ORDER — KETOROLAC TROMETHAMINE 15 MG/ML IJ SOLN
15.0000 mg | Freq: Once | INTRAMUSCULAR | Status: AC
  Administered 2014-08-10: 15 mg via INTRAVENOUS

## 2014-08-10 MED ORDER — ACETAMINOPHEN 160 MG/5ML PO SOLN: 1000.0000 mg | Freq: Four times a day (QID) | ORAL | Status: DC

## 2014-08-10 MED ORDER — 0.9 % SODIUM CHLORIDE (POUR BTL) OPTIME
TOPICAL | Status: DC | PRN
  Administered 2014-08-10: 2000 mL

## 2014-08-10 MED ORDER — FENOFIBRATE 160 MG PO TABS
160.0000 mg | ORAL_TABLET | Freq: Every day | ORAL | Status: DC
  Administered 2014-08-11 – 2014-08-16 (×6): 160 mg via ORAL
  Filled 2014-08-10 (×7): qty 1

## 2014-08-10 MED ORDER — ONDANSETRON HCL 4 MG/2ML IJ SOLN: 4.0000 mg | Freq: Four times a day (QID) | INTRAMUSCULAR | Status: DC | PRN

## 2014-08-10 MED ORDER — FENTANYL CITRATE 0.05 MG/ML IJ SOLN
INTRAMUSCULAR | Status: DC | PRN
Start: 2014-08-10 — End: 2014-08-10
  Administered 2014-08-10 (×2): 100 ug via INTRAVENOUS
  Administered 2014-08-10: 50 ug via INTRAVENOUS

## 2014-08-10 MED ORDER — CETYLPYRIDINIUM CHLORIDE 0.05 % MT LIQD
7.0000 mL | Freq: Two times a day (BID) | OROMUCOSAL | Status: DC
  Administered 2014-08-10 – 2014-08-11 (×3): 7 mL via OROMUCOSAL

## 2014-08-10 MED ORDER — SENNOSIDES-DOCUSATE SODIUM 8.6-50 MG PO TABS
1.0000 | ORAL_TABLET | Freq: Every day | ORAL | Status: DC
  Administered 2014-08-10 – 2014-08-15 (×6): 1 via ORAL
  Filled 2014-08-10 (×7): qty 1

## 2014-08-10 MED ORDER — LEVALBUTEROL HCL 0.63 MG/3ML IN NEBU
0.6300 mg | INHALATION_SOLUTION | Freq: Four times a day (QID) | RESPIRATORY_TRACT | Status: DC
  Administered 2014-08-10 – 2014-08-11 (×3): 0.63 mg via RESPIRATORY_TRACT
  Filled 2014-08-10 (×7): qty 3

## 2014-08-10 MED ORDER — LIDOCAINE HCL (CARDIAC) 20 MG/ML IV SOLN
INTRAVENOUS | Status: DC | PRN
  Administered 2014-08-10: 100 mg via INTRAVENOUS

## 2014-08-10 MED ORDER — HYDROMORPHONE HCL 1 MG/ML IJ SOLN
0.2500 mg | INTRAMUSCULAR | Status: DC | PRN
  Administered 2014-08-10: 0.5 mg via INTRAVENOUS
  Administered 2014-08-10: 1 mg via INTRAVENOUS
  Administered 2014-08-10 (×2): 0.5 mg via INTRAVENOUS

## 2014-08-10 MED ORDER — KETOROLAC TROMETHAMINE 15 MG/ML IJ SOLN
15.0000 mg | Freq: Once | INTRAMUSCULAR | Status: AC
  Administered 2014-08-10: 15 mg via INTRAVENOUS
  Filled 2014-08-10 (×2): qty 1

## 2014-08-10 MED ORDER — BUPIVACAINE HCL (PF) 0.5 % IJ SOLN
INTRAMUSCULAR | Status: DC | PRN
  Administered 2014-08-10: 10 mL

## 2014-08-10 MED ORDER — ASPIRIN EC 325 MG PO TBEC
325.0000 mg | DELAYED_RELEASE_TABLET | Freq: Every day | ORAL | Status: DC
  Administered 2014-08-11 – 2014-08-16 (×6): 325 mg via ORAL
  Filled 2014-08-10 (×6): qty 1

## 2014-08-10 MED ORDER — NALOXONE HCL 0.4 MG/ML IJ SOLN: 0.4000 mg | INTRAMUSCULAR | Status: DC | PRN

## 2014-08-10 MED ORDER — LACTATED RINGERS IV SOLN
INTRAVENOUS | Status: DC | PRN
  Administered 2014-08-10: 07:00:00 via INTRAVENOUS

## 2014-08-10 MED ORDER — FENTANYL 10 MCG/ML IV SOLN
INTRAVENOUS | Status: DC
  Administered 2014-08-10: 10 ug via INTRAVENOUS
  Administered 2014-08-10: 110 ug via INTRAVENOUS
  Administered 2014-08-11: 0 ug via INTRAVENOUS
  Administered 2014-08-11: 30 ug via INTRAVENOUS
  Administered 2014-08-11: 20 ug via INTRAVENOUS
  Administered 2014-08-12: 30 ug via INTRAVENOUS
  Administered 2014-08-12: 50 ug via INTRAVENOUS
  Administered 2014-08-12: 40 ug via INTRAVENOUS
  Administered 2014-08-12: 20 ug via INTRAVENOUS
  Administered 2014-08-12: 10 ug via INTRAVENOUS
  Filled 2014-08-10: qty 50

## 2014-08-10 MED ORDER — KETOROLAC TROMETHAMINE 15 MG/ML IJ SOLN
INTRAMUSCULAR | Status: AC
  Filled 2014-08-10: qty 1

## 2014-08-10 MED ORDER — GLYCOPYRROLATE 0.2 MG/ML IJ SOLN
INTRAMUSCULAR | Status: AC
  Filled 2014-08-10: qty 2

## 2014-08-10 MED ORDER — BUPIVACAINE 0.5 % ON-Q PUMP SINGLE CATH 400 ML
400.0000 mL | INJECTION | Status: DC
  Administered 2014-08-10: 400 mL
  Filled 2014-08-10: qty 400

## 2014-08-10 MED ORDER — PROPOFOL 10 MG/ML IV BOLUS
INTRAVENOUS | Status: DC | PRN
  Administered 2014-08-10: 50 mg via INTRAVENOUS
  Administered 2014-08-10: 100 mg via INTRAVENOUS

## 2014-08-10 MED ORDER — DIPHENHYDRAMINE HCL 50 MG/ML IJ SOLN
12.5000 mg | Freq: Four times a day (QID) | INTRAMUSCULAR | Status: DC | PRN
  Filled 2014-08-10: qty 0.25

## 2014-08-10 MED ORDER — ACETAMINOPHEN 500 MG PO TABS
1000.0000 mg | ORAL_TABLET | Freq: Four times a day (QID) | ORAL | Status: DC
  Administered 2014-08-10 – 2014-08-13 (×10): 1000 mg via ORAL
  Filled 2014-08-10 (×16): qty 2

## 2014-08-10 MED ORDER — METOCLOPRAMIDE HCL 5 MG/ML IJ SOLN
10.0000 mg | Freq: Four times a day (QID) | INTRAMUSCULAR | Status: DC
  Administered 2014-08-10: 10 mg via INTRAVENOUS
  Filled 2014-08-10: qty 2

## 2014-08-10 MED ORDER — DEXTROSE 5 % IV SOLN
1.5000 g | Freq: Two times a day (BID) | INTRAVENOUS | Status: AC
  Administered 2014-08-10 – 2014-08-11 (×2): 1.5 g via INTRAVENOUS
  Filled 2014-08-10 (×2): qty 1.5

## 2014-08-10 MED ORDER — PHENYLEPHRINE HCL 10 MG/ML IJ SOLN
10.0000 mg | INTRAVENOUS | Status: DC | PRN
  Administered 2014-08-10 (×2): 20 ug/min via INTRAVENOUS

## 2014-08-10 MED ORDER — HYDROMORPHONE HCL 1 MG/ML IJ SOLN
INTRAMUSCULAR | Status: AC
  Administered 2014-08-10: 0.5 mg via INTRAVENOUS
  Filled 2014-08-10: qty 2

## 2014-08-10 MED ORDER — FENTANYL CITRATE 0.05 MG/ML IJ SOLN
INTRAMUSCULAR | Status: AC
  Filled 2014-08-10: qty 5

## 2014-08-10 MED ORDER — ROCURONIUM BROMIDE 100 MG/10ML IV SOLN
INTRAVENOUS | Status: DC | PRN
  Administered 2014-08-10: 50 mg via INTRAVENOUS
  Administered 2014-08-10: 20 mg via INTRAVENOUS
  Administered 2014-08-10: 10 mg via INTRAVENOUS

## 2014-08-10 MED ORDER — POTASSIUM CHLORIDE 10 MEQ/50ML IV SOLN: 10.0000 meq | Freq: Every day | INTRAVENOUS | Status: DC | PRN

## 2014-08-10 MED ORDER — ARTIFICIAL TEARS OP OINT
TOPICAL_OINTMENT | OPHTHALMIC | Status: DC | PRN
  Administered 2014-08-10: 1 via OPHTHALMIC

## 2014-08-10 MED ORDER — DIPHENHYDRAMINE HCL 50 MG/ML IJ SOLN: 12.5000 mg | Freq: Four times a day (QID) | INTRAMUSCULAR | Status: DC | PRN

## 2014-08-10 MED ORDER — HEMOSTATIC AGENTS (NO CHARGE) OPTIME
TOPICAL | Status: DC | PRN
  Administered 2014-08-10: 1

## 2014-08-10 MED ORDER — ROCURONIUM BROMIDE 50 MG/5ML IV SOLN
INTRAVENOUS | Status: AC
  Filled 2014-08-10: qty 1

## 2014-08-10 MED ORDER — DEXTROSE-NACL 5-0.45 % IV SOLN
INTRAVENOUS | Status: DC
  Administered 2014-08-10 – 2014-08-11 (×2): via INTRAVENOUS

## 2014-08-10 MED ORDER — PROPOFOL 10 MG/ML IV BOLUS
INTRAVENOUS | Status: AC
  Filled 2014-08-10: qty 20

## 2014-08-10 MED ORDER — ONDANSETRON HCL 4 MG/2ML IJ SOLN
4.0000 mg | Freq: Four times a day (QID) | INTRAMUSCULAR | Status: DC | PRN
  Filled 2014-08-10: qty 2

## 2014-08-10 MED ORDER — NEOSTIGMINE METHYLSULFATE 10 MG/10ML IV SOLN
INTRAVENOUS | Status: AC
  Filled 2014-08-10: qty 1

## 2014-08-10 MED ORDER — NALOXONE HCL 0.4 MG/ML IJ SOLN
0.4000 mg | INTRAMUSCULAR | Status: DC | PRN
  Filled 2014-08-10: qty 1

## 2014-08-10 MED ORDER — METOPROLOL SUCCINATE ER 25 MG PO TB24
25.0000 mg | ORAL_TABLET | Freq: Every day | ORAL | Status: DC
  Administered 2014-08-12 – 2014-08-16 (×4): 25 mg via ORAL
  Filled 2014-08-10 (×6): qty 1

## 2014-08-10 MED ORDER — EPHEDRINE SULFATE 50 MG/ML IJ SOLN
INTRAMUSCULAR | Status: AC
  Filled 2014-08-10: qty 1

## 2014-08-10 MED ORDER — PROMETHAZINE HCL 25 MG/ML IJ SOLN
6.2500 mg | INTRAMUSCULAR | Status: DC | PRN
Start: 2014-08-10 — End: 2014-08-10

## 2014-08-10 MED ORDER — SUCCINYLCHOLINE CHLORIDE 20 MG/ML IJ SOLN
INTRAMUSCULAR | Status: AC
  Filled 2014-08-10: qty 1

## 2014-08-10 MED ORDER — BUPIVACAINE HCL (PF) 0.5 % IJ SOLN
INTRAMUSCULAR | Status: AC
  Filled 2014-08-10: qty 10

## 2014-08-10 MED ORDER — MIDAZOLAM HCL 2 MG/2ML IJ SOLN
INTRAMUSCULAR | Status: AC
  Filled 2014-08-10: qty 2

## 2014-08-10 MED ORDER — LIDOCAINE HCL (CARDIAC) 20 MG/ML IV SOLN
INTRAVENOUS | Status: AC
  Filled 2014-08-10: qty 5

## 2014-08-10 MED ORDER — HYDROMORPHONE HCL 1 MG/ML IJ SOLN
0.2500 mg | INTRAMUSCULAR | Status: DC | PRN
  Administered 2014-08-10 (×3): 0.5 mg via INTRAVENOUS

## 2014-08-10 MED ORDER — OXYCODONE HCL 5 MG PO TABS
5.0000 mg | ORAL_TABLET | ORAL | Status: DC | PRN
  Administered 2014-08-10: 5 mg via ORAL
  Administered 2014-08-12 (×2): 10 mg via ORAL
  Filled 2014-08-10 (×2): qty 2
  Filled 2014-08-10: qty 1
  Filled 2014-08-10: qty 2

## 2014-08-10 MED ORDER — NEOSTIGMINE METHYLSULFATE 10 MG/10ML IV SOLN
INTRAVENOUS | Status: DC | PRN
  Administered 2014-08-10: 3 mg via INTRAVENOUS

## 2014-08-10 MED ORDER — FENTANYL 10 MCG/ML IV SOLN
INTRAVENOUS | Status: DC
  Administered 2014-08-10: 13:00:00 via INTRAVENOUS
  Filled 2014-08-10: qty 50

## 2014-08-10 MED ORDER — GLYCOPYRROLATE 0.2 MG/ML IJ SOLN
INTRAMUSCULAR | Status: DC | PRN
  Administered 2014-08-10: 0.4 mg via INTRAVENOUS

## 2014-08-10 MED ORDER — ONDANSETRON HCL 4 MG/2ML IJ SOLN
INTRAMUSCULAR | Status: AC
  Filled 2014-08-10: qty 2

## 2014-08-10 MED ORDER — METOCLOPRAMIDE HCL 5 MG/ML IJ SOLN
10.0000 mg | Freq: Four times a day (QID) | INTRAMUSCULAR | Status: DC
Start: 2014-08-11 — End: 2014-08-11
  Filled 2014-08-10 (×2): qty 2

## 2014-08-10 MED ORDER — SIMVASTATIN 40 MG PO TABS
40.0000 mg | ORAL_TABLET | Freq: Every day | ORAL | Status: DC
  Administered 2014-08-11 – 2014-08-15 (×4): 40 mg via ORAL
  Filled 2014-08-10 (×6): qty 1

## 2014-08-10 MED ORDER — TRAMADOL HCL 50 MG PO TABS: 50.0000 mg | ORAL_TABLET | Freq: Four times a day (QID) | ORAL | Status: DC | PRN

## 2014-08-10 MED ORDER — HYDROMORPHONE HCL 1 MG/ML IJ SOLN
INTRAMUSCULAR | Status: AC
  Administered 2014-08-10: 1 mg via INTRAVENOUS
  Filled 2014-08-10: qty 2

## 2014-08-10 MED ORDER — SODIUM CHLORIDE 0.9 % IJ SOLN
INTRAMUSCULAR | Status: AC
  Filled 2014-08-10: qty 10

## 2014-08-10 SURGICAL SUPPLY — 113 items
ADH SKN CLS APL DERMABOND .7 (GAUZE/BANDAGES/DRESSINGS)
APL SRG 22X2 LUM MLBL SLNT (VASCULAR PRODUCTS)
APPLICATOR TIP EXT COSEAL (VASCULAR PRODUCTS) IMPLANT
BAG DECANTER FOR FLEXI CONT (MISCELLANEOUS) IMPLANT
BALL CTTN LRG ABS STRL LF (GAUZE/BANDAGES/DRESSINGS)
BLADE SURG 11 STRL SS (BLADE) ×4 IMPLANT
BRUSH CYTOL CELLEBRITY 1.5X140 (MISCELLANEOUS) IMPLANT
CANISTER SUCTION 2500CC (MISCELLANEOUS) ×4 IMPLANT
CATH KIT ON Q 5IN SLV (PAIN MANAGEMENT) IMPLANT
CATH THORACIC 28FR (CATHETERS) ×2 IMPLANT
CATH THORACIC 36FR (CATHETERS) IMPLANT
CATH THORACIC 36FR RT ANG (CATHETERS) ×2 IMPLANT
CLIP LIGATING EXTRA MED SLVR (CLIP) ×2 IMPLANT
CLOSURE STERI-STRIP 1/2X4 (GAUZE/BANDAGES/DRESSINGS) ×1
CLSR STERI-STRIP ANTIMIC 1/2X4 (GAUZE/BANDAGES/DRESSINGS) ×1 IMPLANT
CONN 3/8X1/2 ST GISH (MISCELLANEOUS) ×2 IMPLANT
CONN 3/8X3/8 GISH STERILE (MISCELLANEOUS) ×2 IMPLANT
CONN ST 1/4X3/8  BEN (MISCELLANEOUS) ×2
CONN ST 1/4X3/8 BEN (MISCELLANEOUS) IMPLANT
CONN Y 3/8X3/8X3/8  BEN (MISCELLANEOUS)
CONN Y 3/8X3/8X3/8 BEN (MISCELLANEOUS) IMPLANT
CONT SPEC 4OZ CLIKSEAL STRL BL (MISCELLANEOUS) ×8 IMPLANT
COTTONBALL LRG STERILE PKG (GAUZE/BANDAGES/DRESSINGS) IMPLANT
COVER SURGICAL LIGHT HANDLE (MISCELLANEOUS) ×4 IMPLANT
COVER TABLE BACK 60X90 (DRAPES) ×4 IMPLANT
DERMABOND ADVANCED (GAUZE/BANDAGES/DRESSINGS)
DERMABOND ADVANCED .7 DNX12 (GAUZE/BANDAGES/DRESSINGS) IMPLANT
DRAPE CHEST BREAST 15X10 FENES (DRAPES) ×2 IMPLANT
DRAPE LAPAROSCOPIC ABDOMINAL (DRAPES) ×2 IMPLANT
DRAPE WARM FLUID 44X44 (DRAPE) ×4 IMPLANT
DRSG TEGADERM 4X4.75 (GAUZE/BANDAGES/DRESSINGS) ×2 IMPLANT
ELECT BLADE 4.0 EZ CLEAN MEGAD (MISCELLANEOUS)
ELECT BLADE 6.5 EXT (BLADE) ×2 IMPLANT
ELECT REM PT RETURN 9FT ADLT (ELECTROSURGICAL) ×4
ELECTRODE BLDE 4.0 EZ CLN MEGD (MISCELLANEOUS) IMPLANT
ELECTRODE REM PT RTRN 9FT ADLT (ELECTROSURGICAL) ×2 IMPLANT
FORCEPS BIOP RJ4 1.8 (CUTTING FORCEPS) IMPLANT
GAUZE SPONGE 4X4 12PLY STRL (GAUZE/BANDAGES/DRESSINGS) ×4 IMPLANT
GAUZE XEROFORM 1X8 LF (GAUZE/BANDAGES/DRESSINGS) ×2 IMPLANT
GLOVE BIO SURGEON STRL SZ 6.5 (GLOVE) ×1 IMPLANT
GLOVE BIO SURGEON STRL SZ7.5 (GLOVE) ×8 IMPLANT
GLOVE BIO SURGEONS STRL SZ 6.5 (GLOVE) ×1
GLOVE BIOGEL PI IND STRL 6.5 (GLOVE) IMPLANT
GLOVE BIOGEL PI INDICATOR 6.5 (GLOVE) ×6
GLOVE SURG SS PI 6.0 STRL IVOR (GLOVE) ×4 IMPLANT
GLOVE SURG SS PI 7.0 STRL IVOR (GLOVE) ×10 IMPLANT
GOWN STRL REUS W/ TWL LRG LVL3 (GOWN DISPOSABLE) ×6 IMPLANT
GOWN STRL REUS W/TWL LRG LVL3 (GOWN DISPOSABLE) ×20
HANDLE STAPLE ENDO GIA SHORT (STAPLE) ×6
HEMOSTAT SURGICEL 2X14 (HEMOSTASIS) ×2 IMPLANT
KIT BASIN OR (CUSTOM PROCEDURE TRAY) ×4 IMPLANT
KIT CLEAN ENDO COMPLIANCE (KITS) ×4 IMPLANT
KIT ROOM TURNOVER OR (KITS) ×4 IMPLANT
KIT SUCTION CATH 14FR (SUCTIONS) ×4 IMPLANT
MARKER SKIN DUAL TIP RULER LAB (MISCELLANEOUS) ×4 IMPLANT
NDL 18GX1X1/2 (RX/OR ONLY) (NEEDLE) IMPLANT
NDL BIOPSY TRANSBRONCH 21G (NEEDLE) IMPLANT
NDL BLUNT 18X1 FOR OR ONLY (NEEDLE) IMPLANT
NEEDLE 18GX1X1/2 (RX/OR ONLY) (NEEDLE) ×4 IMPLANT
NEEDLE BIOPSY TRANSBRONCH 21G (NEEDLE) IMPLANT
NEEDLE BLUNT 18X1 FOR OR ONLY (NEEDLE) IMPLANT
NEEDLE HYPO 22GX1.5 SAFETY (NEEDLE) IMPLANT
NS IRRIG 1000ML POUR BTL (IV SOLUTION) ×16 IMPLANT
OIL SILICONE PENTAX (PARTS (SERVICE/REPAIRS)) ×4 IMPLANT
PACK CHEST (CUSTOM PROCEDURE TRAY) ×4 IMPLANT
PAD ARMBOARD 7.5X6 YLW CONV (MISCELLANEOUS) ×12 IMPLANT
RELOAD EGIA TRIS TAN 45 CVD (STAPLE) ×28 IMPLANT
RELOAD STAPLE 45 TAN MED CVD (STAPLE) IMPLANT
SEALANT PROGEL (MISCELLANEOUS) IMPLANT
SEALANT SURG COSEAL 4ML (VASCULAR PRODUCTS) ×2 IMPLANT
SEALANT SURG COSEAL 8ML (VASCULAR PRODUCTS) IMPLANT
SOLUTION ANTI FOG 6CC (MISCELLANEOUS) ×4 IMPLANT
SPONGE GAUZE 4X4 12PLY STER LF (GAUZE/BANDAGES/DRESSINGS) ×6 IMPLANT
SPONGE INTESTINAL PEANUT (DISPOSABLE) ×2 IMPLANT
SPONGE TONSIL 1.25 RF SGL STRG (GAUZE/BANDAGES/DRESSINGS) ×6 IMPLANT
STAPLER ENDO GIA 12 SHRT THIN (STAPLE) ×2 IMPLANT
STAPLER ENDO GIA 12MM SHORT (STAPLE) ×6 IMPLANT
STAPLER TA30 4.8 NON-ABS (STAPLE) ×2 IMPLANT
SUT CHROMIC 3 0 SH 27 (SUTURE) IMPLANT
SUT ETHILON 3 0 PS 1 (SUTURE) IMPLANT
SUT PROLENE 3 0 SH DA (SUTURE) IMPLANT
SUT PROLENE 4 0 RB 1 (SUTURE) ×4
SUT PROLENE 4 0 SH DA (SUTURE) ×2 IMPLANT
SUT PROLENE 4-0 RB1 .5 CRCL 36 (SUTURE) IMPLANT
SUT PROLENE 6 0 C 1 30 (SUTURE) IMPLANT
SUT SILK  1 MH (SUTURE) ×4
SUT SILK 1 MH (SUTURE) ×4 IMPLANT
SUT SILK 1 TIES 10X30 (SUTURE) IMPLANT
SUT SILK 2 0SH CR/8 30 (SUTURE) IMPLANT
SUT SILK 3 0SH CR/8 30 (SUTURE) IMPLANT
SUT VIC AB 1 CTX 18 (SUTURE) ×4 IMPLANT
SUT VIC AB 2 TP1 27 (SUTURE) ×2 IMPLANT
SUT VIC AB 2-0 CTX 36 (SUTURE) ×2 IMPLANT
SUT VIC AB 3-0 SH 18 (SUTURE) IMPLANT
SUT VIC AB 3-0 X1 27 (SUTURE) IMPLANT
SUT VICRYL 0 UR6 27IN ABS (SUTURE) ×2 IMPLANT
SUT VICRYL 2 TP 1 (SUTURE) ×2 IMPLANT
SWAB COLLECTION DEVICE MRSA (MISCELLANEOUS) IMPLANT
SYR 20ML ECCENTRIC (SYRINGE) ×4 IMPLANT
SYR 5ML LUER SLIP (SYRINGE) ×4 IMPLANT
SYR CONTROL 10ML LL (SYRINGE) ×2 IMPLANT
SYSTEM SAHARA CHEST DRAIN ATS (WOUND CARE) ×4 IMPLANT
TAPE CLOTH SURG 4X10 WHT LF (GAUZE/BANDAGES/DRESSINGS) ×6 IMPLANT
TIP APPLICATOR SPRAY EXTEND 16 (VASCULAR PRODUCTS) IMPLANT
TOWEL OR 17X24 6PK STRL BLUE (TOWEL DISPOSABLE) ×8 IMPLANT
TOWEL OR 17X26 10 PK STRL BLUE (TOWEL DISPOSABLE) ×8 IMPLANT
TRAP SPECIMEN MUCOUS 40CC (MISCELLANEOUS) ×4 IMPLANT
TRAY FOLEY CATH 16FRSI W/METER (SET/KITS/TRAYS/PACK) ×4 IMPLANT
TUBE ANAEROBIC SPECIMEN COL (MISCELLANEOUS) IMPLANT
TUBE CONNECTING 20'X1/4 (TUBING) ×2
TUBE CONNECTING 20X1/4 (TUBING) ×6 IMPLANT
TUNNELER SHEATH ON-Q 11GX8 DSP (PAIN MANAGEMENT) ×4 IMPLANT
WATER STERILE IRR 1000ML POUR (IV SOLUTION) ×6 IMPLANT

## 2014-08-10 NOTE — Anesthesia Postprocedure Evaluation (Signed)
  Anesthesia Post-op Note  Patient: Jeremy Deleon  Procedure(s) Performed: Procedure(s): VIDEO ASSISTED THORACOSCOPY (VATS)/ LOBECTOMY (Left) VIDEO BRONCHOSCOPY (N/A)  Patient Location: PACU  Anesthesia Type:General  Level of Consciousness: awake and alert   Airway and Oxygen Therapy: Patient Spontanous Breathing  Post-op Pain: none  Post-op Assessment: Post-op Vital signs reviewed  Post-op Vital Signs: Reviewed  Last Vitals:  Filed Vitals:   08/10/14 1330  BP: 100/47  Pulse: 58  Temp:   Resp: 8    Complications: No apparent anesthesia complications

## 2014-08-10 NOTE — Progress Notes (Signed)
TCTS BRIEF SICU PROGRESS NOTE  Day of Surgery  S/P Procedure(s) (LRB): VIDEO ASSISTED THORACOSCOPY (VATS)/ LOBECTOMY (Left) VIDEO BRONCHOSCOPY (N/A)   Awake and alert.  Good pain control.  Breathing comfortably NSR w/ stable BP O2 sats 99% Chest tube output low  Plan: Continue routine early postop  OWEN,CLARENCE H 08/10/2014 8:13 PM

## 2014-08-10 NOTE — Transfer of Care (Signed)
Immediate Anesthesia Transfer of Care Note  Patient: Jeremy Deleon  Procedure(s) Performed: Procedure(s): VIDEO ASSISTED THORACOSCOPY (VATS)/ LOBECTOMY (Left) VIDEO BRONCHOSCOPY (N/A)  Patient Location: PACU  Anesthesia Type:General  Level of Consciousness: awake, alert  and oriented  Airway & Oxygen Therapy: Patient Spontanous Breathing and Patient connected to face mask oxygen  Post-op Assessment: Report given to PACU RN and Post -op Vital signs reviewed and stable  Post vital signs: Reviewed and stable  Complications: No apparent anesthesia complications

## 2014-08-10 NOTE — Brief Op Note (Signed)
08/10/2014  11:02 AM  PATIENT:  Jeremy Deleon  81 y.o. male  PRE-OPERATIVE DIAGNOSIS:  LLL LUNG CANCER  POST-OPERATIVE DIAGNOSIS:  LLL LUNG CANCER  PROCEDURE:  Procedure(s):  VIDEO ASSISTED THORACOTOMY -Left Lower Lobectomy -Lymph node sampling  SURGEON:  Surgeon(s) and Role:    * Ivin Poot, MD - Primary  PHYSICIAN ASSISTANT: Ellwood Handler PA-C  ANESTHESIA:   general  EBL:  Total I/O In: 1000 [I.V.:1000] Out: 200 [Blood:200]  BLOOD ADMINISTERED:none  DRAINS: Left Pleural Chest Tubes   LOCAL MEDICATIONS USED:  NONE  SPECIMEN:  Source of Specimen:  Left Lower Lobe, Lymph nodes  DISPOSITION OF SPECIMEN:  PATHOLOGY  COUNTS:  YES  TOURNIQUET:  * No tourniquets in log *  DICTATION: .Dragon Dictation  PLAN OF CARE: Admit to inpatient   PATIENT DISPOSITION:  ICU - extubated and stable.   Delay start of Pharmacological VTE agent (>24hrs) due to surgical blood loss or risk of bleeding: yes

## 2014-08-10 NOTE — Anesthesia Preprocedure Evaluation (Addendum)
Anesthesia Evaluation  Patient identified by MRN, date of birth, ID band Patient awake    Reviewed: Allergy & Precautions, NPO status , Patient's Chart, lab work & pertinent test results, reviewed documented beta blocker date and time   Airway Mallampati: II  TM Distance: >3 FB Neck ROM: Full    Dental  (+) Edentulous Upper, Edentulous Lower   Pulmonary COPDformer smoker,          Cardiovascular hypertension, + CAD and + Cardiac Stents  Nl Myoview in 2009. Normal LV function.   Neuro/Psych negative neurological ROS     GI/Hepatic Neg liver ROS, GERD-  ,  Endo/Other  negative endocrine ROS  Renal/GU Renal InsufficiencyRenal disease     Musculoskeletal   Abdominal   Peds  Hematology negative hematology ROS (+)   Anesthesia Other Findings   Reproductive/Obstetrics                            Anesthesia Physical Anesthesia Plan  ASA: III  Anesthesia Plan: General   Post-op Pain Management:    Induction: Intravenous  Airway Management Planned: Double Lumen EBT  Additional Equipment: Arterial line  Intra-op Plan:   Post-operative Plan: Extubation in OR  Informed Consent: I have reviewed the patients History and Physical, chart, labs and discussed the procedure including the risks, benefits and alternatives for the proposed anesthesia with the patient or authorized representative who has indicated his/her understanding and acceptance.   Dental advisory given  Plan Discussed with: CRNA  Anesthesia Plan Comments:        Anesthesia Quick Evaluation

## 2014-08-10 NOTE — Progress Notes (Signed)
The patient was examined and preop studies reviewed. There has been no change from the prior exam and the patient is ready for surgery.   Plan bronchoscopy and Left VATS on Jeremy Deleon

## 2014-08-10 NOTE — Anesthesia Procedure Notes (Signed)
Procedure Name: Intubation Date/Time: 08/10/2014 8:20 AM Performed by: Izora Gala Pre-anesthesia Checklist: Patient identified, Emergency Drugs available, Suction available and Patient being monitored Patient Re-evaluated:Patient Re-evaluated prior to inductionOxygen Delivery Method: Circle system utilized Preoxygenation: Pre-oxygenation with 100% oxygen Intubation Type: IV induction Ventilation: Mask ventilation without difficulty and Oral airway inserted - appropriate to patient size Laryngoscope Size: Miller and 3 Grade View: Grade I Tube type: Oral Endobronchial tube: Left and Double lumen EBT and 39 Fr Number of attempts: 1 Airway Equipment and Method: Stylet Placement Confirmation: ETT inserted through vocal cords under direct vision,  positive ETCO2 and breath sounds checked- equal and bilateral Secured at: 31 cm Tube secured with: Tape Dental Injury: Teeth and Oropharynx as per pre-operative assessment

## 2014-08-11 ENCOUNTER — Encounter (HOSPITAL_COMMUNITY): Payer: Self-pay | Admitting: Cardiothoracic Surgery

## 2014-08-11 ENCOUNTER — Inpatient Hospital Stay (HOSPITAL_COMMUNITY): Payer: Medicare Other

## 2014-08-11 LAB — BASIC METABOLIC PANEL
Anion gap: 8 (ref 5–15)
BUN: 17 mg/dL (ref 6–23)
CALCIUM: 7.9 mg/dL — AB (ref 8.4–10.5)
CO2: 25 mmol/L (ref 19–32)
Chloride: 105 mEq/L (ref 96–112)
Creatinine, Ser: 1.13 mg/dL (ref 0.50–1.35)
GFR calc Af Amer: 69 mL/min — ABNORMAL LOW (ref >90)
GFR calc non Af Amer: 60 mL/min — ABNORMAL LOW (ref >90)
GLUCOSE: 129 mg/dL — AB (ref 70–99)
Potassium: 3.7 mmol/L (ref 3.5–5.1)
Sodium: 138 mmol/L (ref 135–145)

## 2014-08-11 LAB — CBC
HCT: 33.4 % — ABNORMAL LOW (ref 39.0–52.0)
HEMOGLOBIN: 11.1 g/dL — AB (ref 13.0–17.0)
MCH: 29.4 pg (ref 26.0–34.0)
MCHC: 33.2 g/dL (ref 30.0–36.0)
MCV: 88.4 fL (ref 78.0–100.0)
PLATELETS: 164 10*3/uL (ref 150–400)
RBC: 3.78 MIL/uL — ABNORMAL LOW (ref 4.22–5.81)
RDW: 14.3 % (ref 11.5–15.5)
WBC: 6.8 10*3/uL (ref 4.0–10.5)

## 2014-08-11 LAB — POCT I-STAT 3, ART BLOOD GAS (G3+)
Acid-base deficit: 1 mmol/L (ref 0.0–2.0)
Bicarbonate: 23.5 mEq/L (ref 20.0–24.0)
O2 Saturation: 94 %
Patient temperature: 36.6
TCO2: 25 mmol/L (ref 0–100)
pCO2 arterial: 38.9 mmHg (ref 35.0–45.0)
pH, Arterial: 7.388 (ref 7.350–7.450)
pO2, Arterial: 71 mmHg — ABNORMAL LOW (ref 80.0–100.0)

## 2014-08-11 MED ORDER — FUROSEMIDE 10 MG/ML IJ SOLN
20.0000 mg | Freq: Two times a day (BID) | INTRAMUSCULAR | Status: DC
  Administered 2014-08-11 – 2014-08-13 (×5): 20 mg via INTRAVENOUS
  Filled 2014-08-11 (×7): qty 2

## 2014-08-11 MED ORDER — ALBUMIN HUMAN 5 % IV SOLN
12.5000 g | Freq: Once | INTRAVENOUS | Status: AC
  Administered 2014-08-11: 12.5 g via INTRAVENOUS
  Filled 2014-08-11: qty 250

## 2014-08-11 MED ORDER — LEVALBUTEROL HCL 0.63 MG/3ML IN NEBU: 0.6300 mg | INHALATION_SOLUTION | Freq: Four times a day (QID) | RESPIRATORY_TRACT | Status: DC | PRN

## 2014-08-11 MED ORDER — KETOROLAC TROMETHAMINE 15 MG/ML IJ SOLN: 15.0000 mg | Freq: Once | INTRAMUSCULAR | Status: DC

## 2014-08-11 MED ORDER — METOCLOPRAMIDE HCL 5 MG/ML IJ SOLN
10.0000 mg | Freq: Four times a day (QID) | INTRAMUSCULAR | Status: AC
  Administered 2014-08-11 – 2014-08-13 (×6): 10 mg via INTRAVENOUS
  Filled 2014-08-11 (×5): qty 2

## 2014-08-11 NOTE — Op Note (Signed)
NAME:  Jeremy Deleon, Jeremy Deleon NO.:  [id number]  MEDICAL RECORD NO.:  [id number]  LOCATION:  2S15C                        FACILITY:  [facility]  PHYSICIAN:  Ivin Poot, M.D.  DATE OF BIRTH:  [birth year]  DATE OF PROCEDURE:  08/10/2014 DATE OF DISCHARGE:                              OPERATIVE REPORT   OPERATION: 1. Left VATS (video-assisted thoracoscopic surgery) with left lower     lobectomy. 2. Mediastinal lymph node dissection. 3. Video bronchoscopy.  PREOPERATIVE DIAGNOSIS:  Squamous cell carcinoma, left lower lobe.  POSTOPERATIVE DIAGNOSIS:  Squamous cell carcinoma, left lower lobe.  SURGEON:  Ivin Poot, M.D.  ASSISTANT:  Ellwood Handler, PA-C.  ANESTHESIA:  General by Dr. Ola Spurr.  INDICATIONS:  The patient is a 81 year old ex-smoker, recently diagnosed with a left lower lobe mass with positive metabolic activity on PET scan and biopsy proven to be squamous cell carcinoma.  There was no evidence of mediastinal nodal involvement or distant metastatic disease and a brain MRI was negative.  PFTs were adequate and surgical resection with lobectomy was recommended.  Prior to surgery, I examined the patient in the office and reviewed the results of the multiple scans and his biopsy results.  I discussed the details of video bronchoscopy and left VATS-thoracotomy including the use of general anesthesia, location of the surgical incisions, the expected postoperative recovery, and use of postoperative chest tube drainage system.  Discussed with the patient the risks to him of the surgery including the risks of bleeding, blood transfusion, MI, postoperative pulmonary problems including pneumonia or pleural effusion, prolonged air leak, and death.  He understood that the thoracotomy incision would be painful, and we would be using a postoperative PCA system to help manage that postoperative pain.  After reviewing all these issues, he demonstrated his  understanding and agreed to proceed with surgery under what I felt was an informed consent.  FINDINGS: 1. Clear airway on video bronchoscopy. 2. Fairly large size left lower lobe tumor making dissection of the     fissure for exposure of the pulmonary arteries difficult. 3. Minimal blood loss during the surgery. 4. Frozen section of bronchial margin was negative.  OPERATIVE PROCEDURE:  The patient was brought to the operating room, placed supine on operative table, general anesthesia was induced.  An endotracheal tube was positioned.  After intubation, a proper time-out was performed.  Through the endotracheal tube, a video bronchoscope was passed.  The distal trachea and carina were normal.  The bronchoscope was passed on the left mainstem bronchus.  There were no endobronchial abnormalities.  The left upper lobe bronchus had no endobronchial lesions, but the airway was extrinsically compressed.  The left lower lobe bronchus was examined and segments were examined, there were no endobronchial lesions.  The bronchoscope was then passed down the right mainstem bronchus which was normal.  The right upper lobe, right middle lobe, and right lower lobe endobronchial orifices were visualized, and there were no endobronchial lesions on the right side.  The bronchoscope was withdrawn.  The patient was then reintubated with a double-lumen endotracheal tube and then positioned left side up.  The patient was prepped and draped  as a sterile field.  A proper time-out was performed.  A small incision was made at the tip of the scapula.  A VATS camera was inserted.  The lung had some adhesions to the aorta posteriorly, but no apical adhesions. The tumor in the left lower lobe could not be visualized with the camera.  The camera was withdrawn.  The incision was extended anteriorly in the fifth interspace and the ribs were gently spread, but not divided.  The inferior pulmonary ligament was taken  down.  The inferior vein was dissected and encircled with a vessel loop.  The interlobar fissure was opened from the inferior aspect, and the pulmonary artery was carefully dissected and the segments of the upper lobe and lower lobe were identified.  The lower lobe segments were dissected and sequentially stapled and divided with an Endo-GIA stapling device.  The fissure was then completed superiorly between the upper lobe and lower lobe with the Endo-GIA stapling device. The fissure was completed inferiorly with the Endo-GIA stapling device.  The inferior vein was then stapled with the Endo-GIA stapling device. This left the bronchus.  The bronchus was carefully identified and the TA-30 stapler was used to first clamp the lower lobe bronchus.  The anesthesia team inflated the left lung and the left upper lobe inflated normally.  The staple line was then deployed and the bronchus was divided distal to the staple line.  The specimen was removed.  The frozen section on the bronchial margin was negative.  Next, several lymph nodes in the hilum in the AP window were resected. The lymph nodes in the inferior ligament were also resected and submitted.  The adventitia on the descending thoracic aorta which had been adherent to the tumor was resected and submitted for margin.  The chest was filled with warm saline and the left lung was gently bagged.  There was minimal air leak.  Next, medical adhesive-Co Seal-was then applied to the staple lines of the fissure.  Hemostasis was achieved.  Chest tubes were placed in the anterior and posterior left pleural space and brought out through separate incisions and secured to the skin.  Next, the ribs were reapproximated with #2 Vicryl pericostals.  Prior to closing the chest, the left lung was inflated under direct vision and the left upper lobe inflated well.  The muscle layers were closed with interrupted #1 Vicryl.  After the superficial muscle  layer was closed, an On-Q catheter was placed beneath the main incision using the needle.  It was secured to the skin and connected to an On-Q reservoir of 0.5% Marcaine.  The subcutaneous layer was closed in running Vicryl.  The skin was closed with a subcuticular.  The chest tubes were connected to an underwater seal Pleur-evac drainage system and sterile dressings were applied.  The patient was then rolled supine and extubated in the operating room and returned to the recovery room in stable condition.     Ivin Poot, M.D.     PV/MEDQ  D:  08/10/2014  T:  08/11/2014  Job:  [id number]

## 2014-08-11 NOTE — Progress Notes (Signed)
Patient ID: Jeremy Deleon, male   DOB: [birth year], 81 y.o.   MRN: [id number] EVENING ROUNDS NOTE :     [name] [address]             [phone]                 1 Day Post-Op Procedure(s) (LRB): VIDEO ASSISTED THORACOSCOPY (VATS)/ LOBECTOMY (Left) VIDEO BRONCHOSCOPY (N/A)  Total Length of Stay:  LOS: 1 day  BP 149/66 mmHg  Pulse 69  Temp(Src) 98 F (36.7 C) (Oral)  Resp 16  Ht 6\' 2"  (1.88 m)  SpO2 96%  .Intake/Output      01/21 0701 - 01/22 0700 01/22 0701 - 01/23 0700   P.O. 100 270   I.V. 3258.3 220   IV Piggyback 300    Total Intake 3658.3 490   Urine 1315 2025   Blood 300    Chest Tube 390 150   Total Output 2005 2175   Net +1653.3 -1685          . dextrose 5 % and 0.45% NaCl 20 mL/hr at 08/11/14 1500     Lab Results  Component Value Date   WBC 6.8 08/11/2014   HGB 11.1* 08/11/2014   HCT 33.4* 08/11/2014   PLT 164 08/11/2014   GLUCOSE 129* 08/11/2014   ALT 11 08/08/2014   AST 24 08/08/2014   NA 138 08/11/2014   K 3.7 08/11/2014   CL 105 08/11/2014   CREATININE 1.13 08/11/2014   BUN 17 08/11/2014   CO2 25 08/11/2014   INR 1.00 08/08/2014   Stable day, ambulated  Grace Isaac MD  Beeper [phone] Office [phone] 08/11/2014 4:41 PM

## 2014-08-11 NOTE — Progress Notes (Signed)
1 Day Post-Op Procedure(s) (LRB): VIDEO ASSISTED THORACOSCOPY (VATS)/ LOBECTOMY (Left) VIDEO BRONCHOSCOPY (N/A) Subjective: OOB to chair after LLL lobectomy for Squamous Cell Ca BP stable post 5% albumin Objective: Vital signs in last 24 hours: Temp:  [97.3 F (36.3 C)-98 F (36.7 C)] 97.9 F (36.6 C) (01/22 0412) Pulse Rate:  [58-77] 61 (01/22 0700) Cardiac Rhythm:  [-] Normal sinus rhythm (01/22 0000) Resp:  [7-25] 13 (01/22 0700) BP: (86-130)/(39-85) 115/50 mmHg (01/22 0700) SpO2:  [94 %-100 %] 96 % (01/22 0700) Arterial Line BP: (92-162)/(34-65) 105/36 mmHg (01/22 0500)  Hemodynamic parameters for last 24 hours:  stable  Intake/Output from previous day: 01/21 0701 - 01/22 0700 In: 3558.3 [P.O.:100; I.V.:3158.3; IV Piggyback:300] Out: 2005 [Urine:1315; Blood:300; Chest Tube:390] Intake/Output this shift:    No air leak serosang CT drainage Lungs clear  Lab Results:  Recent Labs  08/08/14 0955 08/11/14 0420  WBC 6.9 6.8  HGB 14.1 11.1*  HCT 42.1 33.4*  PLT 217 164   BMET:  Recent Labs  08/08/14 0955 08/11/14 0420  NA 140 138  K 3.5 3.7  CL 106 105  CO2 26 25  GLUCOSE 96 129*  BUN 24* 17  CREATININE 1.22 1.13  CALCIUM 9.5 7.9*    PT/INR:  Recent Labs  08/08/14 0955  LABPROT 13.3  INR 1.00   ABG    Component Value Date/Time   PHART 7.388 08/11/2014 0429   HCO3 23.5 08/11/2014 0429   TCO2 25 08/11/2014 0429   ACIDBASEDEF 1.0 08/11/2014 0429   O2SAT 94.0 08/11/2014 0429   CBG (last 3)  No results for input(s): GLUCAP in the last 72 hours.  Assessment/Plan: S/P Procedure(s) (LRB): VIDEO ASSISTED THORACOSCOPY (VATS)/ LOBECTOMY (Left) VIDEO BRONCHOSCOPY (N/A) See progression orders diuresis  LOS: 1 day    VAN TRIGT III,Jacobi Ryant 08/11/2014

## 2014-08-12 ENCOUNTER — Inpatient Hospital Stay (HOSPITAL_COMMUNITY): Payer: Medicare Other

## 2014-08-12 ENCOUNTER — Encounter (HOSPITAL_COMMUNITY): Payer: Self-pay | Admitting: *Deleted

## 2014-08-12 LAB — TYPE AND SCREEN
ABO/RH(D): O NEG
Antibody Screen: NEGATIVE
Unit division: 0
Unit division: 0

## 2014-08-12 LAB — CBC
HCT: 38.1 % — ABNORMAL LOW (ref 39.0–52.0)
Hemoglobin: 12.7 g/dL — ABNORMAL LOW (ref 13.0–17.0)
MCH: 29.3 pg (ref 26.0–34.0)
MCHC: 33.3 g/dL (ref 30.0–36.0)
MCV: 87.8 fL (ref 78.0–100.0)
Platelets: 197 10*3/uL (ref 150–400)
RBC: 4.34 MIL/uL (ref 4.22–5.81)
RDW: 13.9 % (ref 11.5–15.5)
WBC: 8.6 10*3/uL (ref 4.0–10.5)

## 2014-08-12 LAB — COMPREHENSIVE METABOLIC PANEL
ALT: 11 U/L (ref 0–53)
AST: 26 U/L (ref 0–37)
Albumin: 3 g/dL — ABNORMAL LOW (ref 3.5–5.2)
Alkaline Phosphatase: 45 U/L (ref 39–117)
Anion gap: 7 (ref 5–15)
BUN: 14 mg/dL (ref 6–23)
CO2: 30 mmol/L (ref 19–32)
Calcium: 8.5 mg/dL (ref 8.4–10.5)
Chloride: 102 mmol/L (ref 96–112)
Creatinine, Ser: 1.06 mg/dL (ref 0.50–1.35)
GFR calc Af Amer: 75 mL/min — ABNORMAL LOW (ref >90)
GFR calc non Af Amer: 65 mL/min — ABNORMAL LOW (ref >90)
Glucose, Bld: 114 mg/dL — ABNORMAL HIGH (ref 70–99)
Potassium: 3.4 mmol/L — ABNORMAL LOW (ref 3.5–5.1)
Sodium: 139 mmol/L (ref 135–145)
Total Bilirubin: 0.6 mg/dL (ref 0.3–1.2)
Total Protein: 5.6 g/dL — ABNORMAL LOW (ref 6.0–8.3)

## 2014-08-12 LAB — GLUCOSE, CAPILLARY: Glucose-Capillary: 119 mg/dL — ABNORMAL HIGH (ref 70–99)

## 2014-08-12 MED ORDER — POTASSIUM CHLORIDE ER 10 MEQ PO TBCR
20.0000 meq | EXTENDED_RELEASE_TABLET | Freq: Two times a day (BID) | ORAL | Status: AC
  Administered 2014-08-12 – 2014-08-13 (×3): 20 meq via ORAL
  Filled 2014-08-12 (×5): qty 2

## 2014-08-12 MED ORDER — ENOXAPARIN SODIUM 30 MG/0.3ML ~~LOC~~ SOLN
30.0000 mg | SUBCUTANEOUS | Status: DC
  Administered 2014-08-12 – 2014-08-16 (×5): 30 mg via SUBCUTANEOUS
  Filled 2014-08-12 (×5): qty 0.3

## 2014-08-12 MED ORDER — FUROSEMIDE 10 MG/ML IJ SOLN: 40.0000 mg | Freq: Once | INTRAMUSCULAR | Status: DC

## 2014-08-12 NOTE — Progress Notes (Signed)
Wasted 37mcg of Fentanyl from PCA syringe down the sink with Glenford Peers, RN.   Maia Petties, RN [phone] 08/12/14

## 2014-08-12 NOTE — Progress Notes (Signed)
Patient ID: Jeremy Deleon, male   DOB: Jul 30, 1932, 81 y.o.   MRN: [id number] TCTS DAILY ICU PROGRESS NOTE                   [address]            [place] [address]          [phone]   2 Days Post-Op Procedure(s) (LRB): VIDEO ASSISTED THORACOSCOPY (VATS)/ LOBECTOMY (Left) VIDEO BRONCHOSCOPY (N/A)  Total Length of Stay:  LOS: 2 days   Subjective: Awake and alert, walked 150 feet sob with exertion  Objective: Vital signs in last 24 hours: Temp:  [97.4 F (36.3 C)-98.3 F (36.8 C)] 98 F (36.7 C) (01/23 0700) Pulse Rate:  [63-106] 84 (01/23 0800) Cardiac Rhythm:  [-] Normal sinus rhythm (01/23 0800) Resp:  [12-26] 13 (01/23 0800) BP: (124-159)/(56-97) 125/63 mmHg (01/23 0800) SpO2:  [91 %-100 %] 96 % (01/23 0800)  Filed Weights   08/11/14 0800  Weight: 195 lb 11.2 oz (88.769 kg)    Weight change:    Hemodynamic parameters for last 24 hours:    Intake/Output from previous day: 01/22 0701 - 01/23 0700 In: 930 [P.O.:390; I.V.:540] Out: 3780 [Urine:3520; Chest Tube:260]  Intake/Output this shift: Total I/O In: 20 [I.V.:20] Out: 105 [Urine:35; Chest Tube:70]  Current Meds: Scheduled Meds: . acetaminophen  1,000 mg Oral 4 times per day   Or  . acetaminophen (TYLENOL) oral liquid 160 mg/5 mL  1,000 mg Oral 4 times per day  . antiseptic oral rinse  7 mL Mouth Rinse BID  . aspirin EC  325 mg Oral Daily  . bisacodyl  10 mg Oral Daily  . fenofibrate  160 mg Oral Daily  . fentaNYL   Intravenous 6 times per day  . furosemide  20 mg Intravenous BID  . ketorolac  15 mg Intravenous Once  . metoCLOPramide (REGLAN) injection  10 mg Intravenous 4 times per day  . metoprolol succinate  25 mg Oral Daily  . senna-docusate  1 tablet Oral QHS  . simvastatin  40 mg Oral q1800   Continuous Infusions: . dextrose 5 % and 0.45% NaCl 20 mL/hr at 08/12/14 0800   PRN Meds:.diphenhydrAMINE **OR** diphenhydrAMINE, levalbuterol, naloxone **AND** sodium chloride,  ondansetron (ZOFRAN) IV, oxyCODONE, potassium chloride, traMADol  General appearance: alert, cooperative and no distress Neurologic: intact Heart: regular rate and rhythm, S1, S2 normal, no murmur, click, rub or gallop Lungs: rhonchi RML and RUL Abdomen: soft, non-tender; bowel sounds normal; no masses,  no organomegaly Extremities: extremities normal, atraumatic, no cyanosis or edema and Homans sign is negative, no sign of DVT Wound: no air leak with cough  Lab Results: CBC: Recent Labs  08/11/14 0420 08/12/14 0230  WBC 6.8 8.6  HGB 11.1* 12.7*  HCT 33.4* 38.1*  PLT 164 197   BMET:  Recent Labs  08/11/14 0420 08/12/14 0230  NA 138 139  K 3.7 3.4*  CL 105 102  CO2 25 30  GLUCOSE 129* 114*  BUN 17 14  CREATININE 1.13 1.06  CALCIUM 7.9* 8.5    PT/INR: No results for input(s): LABPROT, INR in the last 72 hours. Radiology: Dg Chest Port 1 View  08/12/2014   CLINICAL DATA:  Shortness of breath  EXAM: PORTABLE CHEST - 1 VIEW  COMPARISON:  08/11/2014  FINDINGS: Left-sided chest tube is in stable position. No evidence of pneumothorax. Stable elevation of the left diaphragm status post lobectomy. Heart size and mediastinal contours  are also stable. There is a small right pleural effusion and mild basilar atelectasis. A calcified 1 cm nodule in the right lung is again noted.  IMPRESSION: 1. No pneumothorax or change in chest tube positioning. 2. Postoperative atelectasis and trace right effusion.   Electronically Signed   By: Jorje Guild M.D.   On: 08/12/2014 05:19   Dg Chest Port 1 View  08/11/2014   CLINICAL DATA:  Left-sided lung carcinoma, status post video-assisted thoracoscopy  EXAM: PORTABLE CHEST - 1 VIEW  COMPARISON:  August 10, 2014  FINDINGS: Chest tube positions on the left are unchanged. No pneumothorax. There is volume loss in the left base. Lungs elsewhere clear. Heart is mildly enlarged with pulmonary vascularity within normal limits. No adenopathy.  IMPRESSION: No  pneumothorax. Chest tube positions on the left are stable. Stable left base atelectasis. No new opacity. No change in cardiac silhouette.   Electronically Signed   By: Lowella Grip M.D.   On: 08/11/2014 06:29   Dg Chest Port 1 View  08/10/2014   CLINICAL DATA:  Post LEFT lower lobectomy  EXAM: PORTABLE CHEST - 1 VIEW  COMPARISON:  Portable exam 1221 hr compared to 08/08/2014  FINDINGS: Two new LEFT thoracostomy tubes.  Enlargement of cardiac silhouette.  Slight prominence of mediastinum likely related to portable slightly rotated technique.  Pulmonary vascularity normal.  Calcified granuloma RIGHT upper lobe.  Minimal LEFT pleural effusion and bibasilar atelectasis.  No definite pneumothorax.  No acute osseous findings.  IMPRESSION: No pneumothorax following LEFT lower lobectomy and thoracostomy tube placement.  Bibasilar atelectasis and minimal LEFT pleural effusion.   Electronically Signed   By: Lavonia Dana M.D.   On: 08/10/2014 12:40     Assessment/Plan: S/P Procedure(s) (LRB): VIDEO ASSISTED THORACOSCOPY (VATS)/ LOBECTOMY (Left) VIDEO BRONCHOSCOPY (N/A) Mobilize Diuresis Ct to water seal, start removing tubes in am D/c foley-  Hypokalemia replace k   [name] 08/12/2014 9:00 AM

## 2014-08-12 NOTE — Progress Notes (Signed)
Patient ID: Jeremy Deleon, male   DOB: 27-Nov-1932, 81 y.o.   MRN: [id number] EVENING ROUNDS NOTE :     [address]       [place] [address]             [phone]                 2 Days Post-Op Procedure(s) (LRB): VIDEO ASSISTED THORACOSCOPY (VATS)/ LOBECTOMY (Left) VIDEO BRONCHOSCOPY (N/A)  Total Length of Stay:  LOS: 2 days  BP 108/57 mmHg  Pulse 97  Temp(Src) 98.5 F (36.9 C) (Oral)  Resp 15  Ht 6\' 2"  (1.88 m)  Wt 195 lb 11.2 oz (88.769 kg)  BMI 25.12 kg/m2  SpO2 95%  .Intake/Output      01/22 0701 - 01/23 0700 01/23 0701 - 01/24 0700   P.O. 390 480   I.V. (mL/kg) 540 (6.1) 180 (2)   IV Piggyback     Total Intake(mL/kg) 930 (10.5) 660 (7.4)   Urine (mL/kg/hr) 3520 (1.7) 536 (0.6)   Blood     Chest Tube 260 (0.1) 160 (0.2)   Total Output 3780 696   Net -2850 -36          . dextrose 5 % and 0.45% NaCl 20 mL/hr at 08/12/14 1600     Lab Results  Component Value Date   WBC 8.6 08/12/2014   HGB 12.7* 08/12/2014   HCT 38.1* 08/12/2014   PLT 197 08/12/2014   GLUCOSE 114* 08/12/2014   ALT 11 08/12/2014   AST 26 08/12/2014   NA 139 08/12/2014   K 3.4* 08/12/2014   CL 102 08/12/2014   CREATININE 1.06 08/12/2014   BUN 14 08/12/2014   CO2 30 08/12/2014   INR 1.00 08/08/2014   Stable day, walked 200 ft  Grace Isaac MD  Beeper [phone] Office [phone] 08/12/2014 5:27 PM

## 2014-08-13 ENCOUNTER — Inpatient Hospital Stay (HOSPITAL_COMMUNITY): Payer: Medicare Other

## 2014-08-13 LAB — CBC
HCT: 37.6 % — ABNORMAL LOW (ref 39.0–52.0)
Hemoglobin: 12.6 g/dL — ABNORMAL LOW (ref 13.0–17.0)
MCH: 29.4 pg (ref 26.0–34.0)
MCHC: 33.5 g/dL (ref 30.0–36.0)
MCV: 87.6 fL (ref 78.0–100.0)
Platelets: 213 10*3/uL (ref 150–400)
RBC: 4.29 MIL/uL (ref 4.22–5.81)
RDW: 14.2 % (ref 11.5–15.5)
WBC: 9 10*3/uL (ref 4.0–10.5)

## 2014-08-13 LAB — COMPREHENSIVE METABOLIC PANEL
ALT: 11 U/L (ref 0–53)
AST: 23 U/L (ref 0–37)
Albumin: 2.9 g/dL — ABNORMAL LOW (ref 3.5–5.2)
Alkaline Phosphatase: 50 U/L (ref 39–117)
Anion gap: 9 (ref 5–15)
BUN: 17 mg/dL (ref 6–23)
CO2: 26 mmol/L (ref 19–32)
Calcium: 8.5 mg/dL (ref 8.4–10.5)
Chloride: 102 mmol/L (ref 96–112)
Creatinine, Ser: 1.12 mg/dL (ref 0.50–1.35)
GFR calc Af Amer: 70 mL/min — ABNORMAL LOW (ref >90)
GFR calc non Af Amer: 61 mL/min — ABNORMAL LOW (ref >90)
Glucose, Bld: 98 mg/dL (ref 70–99)
Potassium: 3.6 mmol/L (ref 3.5–5.1)
Sodium: 137 mmol/L (ref 135–145)
Total Bilirubin: 0.6 mg/dL (ref 0.3–1.2)
Total Protein: 5.8 g/dL — ABNORMAL LOW (ref 6.0–8.3)

## 2014-08-13 NOTE — Progress Notes (Signed)
Patient ID: Jeremy Deleon, male   DOB: Apr 06, 1933, 81 y.o.   MRN: [id number] TCTS DAILY ICU PROGRESS NOTE                   [address]            [place] [address]          [phone]   3 Days Post-Op Procedure(s) (LRB): VIDEO ASSISTED THORACOSCOPY (VATS)/ LOBECTOMY (Left) VIDEO BRONCHOSCOPY (N/A)  Total Length of Stay:  LOS: 3 days   Subjective: Awake and alert, walked 300 feet this am  Objective: Vital signs in last 24 hours: Temp:  [97.5 F (36.4 C)-98.6 F (37 C)] 97.5 F (36.4 C) (01/24 0700) Pulse Rate:  [68-97] 92 (01/24 0900) Cardiac Rhythm:  [-] Normal sinus rhythm (01/24 0800) Resp:  [11-25] 18 (01/24 0900) BP: (96-137)/(52-70) 96/52 mmHg (01/24 0900) SpO2:  [92 %-98 %] 92 % (01/24 0900) Weight:  [193 lb (87.544 kg)] 193 lb (87.544 kg) (01/24 0500)  Filed Weights   08/11/14 0800 08/13/14 0500  Weight: 195 lb 11.2 oz (88.769 kg) 193 lb (87.544 kg)    Weight change: -2 lb 11.2 oz (-1.225 kg)   Hemodynamic parameters for last 24 hours:    Intake/Output from previous day: 01/23 0701 - 01/24 0700 In: 1380 [P.O.:940; I.V.:440] Out: 1525 [Urine:1285; Chest Tube:240]  Intake/Output this shift: Total I/O In: 240 [P.O.:240] Out: 220 [Urine:200; Chest Tube:20]  Current Meds: Scheduled Meds: . acetaminophen  1,000 mg Oral 4 times per day   Or  . acetaminophen (TYLENOL) oral liquid 160 mg/5 mL  1,000 mg Oral 4 times per day  . aspirin EC  325 mg Oral Daily  . bisacodyl  10 mg Oral Daily  . enoxaparin (LOVENOX) injection  30 mg Subcutaneous Q24H  . fenofibrate  160 mg Oral Daily  . furosemide  20 mg Intravenous BID  . ketorolac  15 mg Intravenous Once  . metoprolol succinate  25 mg Oral Daily  . senna-docusate  1 tablet Oral QHS  . simvastatin  40 mg Oral q1800   Continuous Infusions: . dextrose 5 % and 0.45% NaCl Stopped (08/13/14 0800)   PRN Meds:.levalbuterol, ondansetron (ZOFRAN) IV, oxyCODONE, potassium chloride,  traMADol  General appearance: alert and cooperative Neurologic: intact Heart: regular rate and rhythm, S1, S2 normal, no murmur, click, rub or gallop Lungs: diminished breath sounds bibasilar Abdomen: soft, non-tender; bowel sounds normal; no masses,  no organomegaly Extremities: extremities normal, atraumatic, no cyanosis or edema and Homans sign is negative, no sign of DVT Wound: no air leak  Lab Results: CBC: Recent Labs  08/12/14 0230 08/13/14 0226  WBC 8.6 9.0  HGB 12.7* 12.6*  HCT 38.1* 37.6*  PLT 197 213   BMET:  Recent Labs  08/12/14 0230 08/13/14 0226  NA 139 137  K 3.4* 3.6  CL 102 102  CO2 30 26  GLUCOSE 114* 98  BUN 14 17  CREATININE 1.06 1.12  CALCIUM 8.5 8.5    PT/INR: No results for input(s): LABPROT, INR in the last 72 hours. Radiology: Dg Chest Port 1 View  08/13/2014   CLINICAL DATA:  Status post lobectomy.  EXAM: PORTABLE CHEST - 1 VIEW  COMPARISON:  08/12/2013  FINDINGS: Stable positioning of 2 left-sided chest tubes. There is a tiny (Less than 5%) left apical pneumothorax which is newly seen.  Postoperative basilar atelectasis and small pleural effusions.  A pulmonary nodule in the right mid lung is calcified.  Unchanged cardiomegaly and aortic tortuosity.  IMPRESSION: Tiny left apical pneumothorax; left-sided chest tubes are in stable position.   Electronically Signed   By: Jorje Guild M.D.   On: 08/13/2014 05:54   Dg Chest Port 1 View  08/12/2014   CLINICAL DATA:  Shortness of breath  EXAM: PORTABLE CHEST - 1 VIEW  COMPARISON:  08/11/2014  FINDINGS: Left-sided chest tube is in stable position. No evidence of pneumothorax. Stable elevation of the left diaphragm status post lobectomy. Heart size and mediastinal contours are also stable. There is a small right pleural effusion and mild basilar atelectasis. A calcified 1 cm nodule in the right lung is again noted.  IMPRESSION: 1. No pneumothorax or change in chest tube positioning. 2. Postoperative  atelectasis and trace right effusion.   Electronically Signed   By: Jorje Guild M.D.   On: 08/12/2014 05:19   Diagnosis 1. Lung, resection (segmental or lobe), left lower lobe - SQUAMOUS CELL CARCINOMA, 6.2 CM. - MARGINS NOT INVOLVED. - THREE BENIGN LYMPH NODES (0/3). 2. Lymph node, biopsy, n1 - ANTHRACOTIC LYMPH NODE. - NO TUMOR IDENTIFIED. 3. Lymph node, biopsy, n1,#2 - ANTHRACOTIC LYMPH NODE. - NO TUMOR IDENTIFIED. 4. Lymph node, biopsy, Aortic - ANTHRACOTIC LYMPH NODE. - NO TUMOR IDENTIFIED. 5. Lymph node, biopsy, AP window - ANTHRACOTIC LYMPH NODE. - NO TUMOR IDENTIFIED. 6. Lymph node, biopsy, L11 - ANTHRACOTIC LYMPH NODE. - NO TUMOR IDENTIFIED. Microscopic Comment  pT2b, pN0, cM0  - Stage IIA   Assessment/Plan: S/P Procedure(s) (LRB): VIDEO ASSISTED THORACOSCOPY (VATS)/ LOBECTOMY (Left) VIDEO BRONCHOSCOPY (N/A) Mobilize Diuresis d/c tubes/lines D/c on Q and posterior chest tube , leave anterior chest tube for now  Waiting for step down bed   Keyston Ardolino B 08/13/2014 9:48 AM

## 2014-08-14 ENCOUNTER — Inpatient Hospital Stay (HOSPITAL_COMMUNITY): Payer: Medicare Other

## 2014-08-14 LAB — COMPREHENSIVE METABOLIC PANEL
ALT: 11 U/L (ref 0–53)
AST: 20 U/L (ref 0–37)
Albumin: 2.9 g/dL — ABNORMAL LOW (ref 3.5–5.2)
Alkaline Phosphatase: 49 U/L (ref 39–117)
Anion gap: 6 (ref 5–15)
BUN: 19 mg/dL (ref 6–23)
CO2: 29 mmol/L (ref 19–32)
Calcium: 8.6 mg/dL (ref 8.4–10.5)
Chloride: 105 mmol/L (ref 96–112)
Creatinine, Ser: 0.96 mg/dL (ref 0.50–1.35)
GFR calc Af Amer: 89 mL/min — ABNORMAL LOW (ref >90)
GFR calc non Af Amer: 77 mL/min — ABNORMAL LOW (ref >90)
Glucose, Bld: 108 mg/dL — ABNORMAL HIGH (ref 70–99)
Potassium: 3.6 mmol/L (ref 3.5–5.1)
Sodium: 140 mmol/L (ref 135–145)
Total Bilirubin: 0.9 mg/dL (ref 0.3–1.2)
Total Protein: 5.8 g/dL — ABNORMAL LOW (ref 6.0–8.3)

## 2014-08-14 LAB — CBC
HCT: 37.1 % — ABNORMAL LOW (ref 39.0–52.0)
Hemoglobin: 12.5 g/dL — ABNORMAL LOW (ref 13.0–17.0)
MCH: 29.6 pg (ref 26.0–34.0)
MCHC: 33.7 g/dL (ref 30.0–36.0)
MCV: 87.9 fL (ref 78.0–100.0)
Platelets: 234 10*3/uL (ref 150–400)
RBC: 4.22 MIL/uL (ref 4.22–5.81)
RDW: 14.1 % (ref 11.5–15.5)
WBC: 8.9 10*3/uL (ref 4.0–10.5)

## 2014-08-14 MED ORDER — ACETAMINOPHEN 325 MG PO TABS: 650.0000 mg | ORAL_TABLET | Freq: Four times a day (QID) | ORAL | Status: DC | PRN

## 2014-08-14 MED ORDER — ENSURE COMPLETE PO LIQD
237.0000 mL | Freq: Three times a day (TID) | ORAL | Status: DC
  Administered 2014-08-15 – 2014-08-16 (×4): 237 mL via ORAL

## 2014-08-14 MED ORDER — POLYETHYLENE GLYCOL 3350 17 G PO PACK
17.0000 g | PACK | Freq: Every day | ORAL | Status: DC
  Administered 2014-08-15: 17 g via ORAL
  Filled 2014-08-14 (×2): qty 1

## 2014-08-14 MED ORDER — LACTULOSE 10 GM/15ML PO SOLN
20.0000 g | Freq: Every day | ORAL | Status: DC | PRN
  Administered 2014-08-14: 20 g via ORAL
  Filled 2014-08-14 (×2): qty 30

## 2014-08-14 NOTE — Progress Notes (Addendum)
      [address]       [place], [address]             [phone]      4 Days Post-Op Procedure(s) (LRB): VIDEO ASSISTED THORACOSCOPY (VATS)/ LOBECTOMY (Left) VIDEO BRONCHOSCOPY (N/A)   Subjective:  Mr. Sturtevant complains of constipation this morning.  He is requesting a laxative.  He also states he did not sleep at all last night.    Objective: Vital signs in last 24 hours: Temp:  [97.6 F (36.4 C)-98.2 F (36.8 C)] 97.6 F (36.4 C) (01/25 0744) Pulse Rate:  [81-101] 93 (01/25 0744) Cardiac Rhythm:  [-] Normal sinus rhythm (01/25 0744) Resp:  [16-22] 20 (01/25 0744) BP: (96-138)/(45-79) 133/62 mmHg (01/25 0744) SpO2:  [92 %-100 %] 97 % (01/25 0744)  Intake/Output from previous day: 01/24 0701 - 01/25 0700 In: 680 [P.O.:600; I.V.:80] Out: 1080 [Urine:1000; Chest Tube:80]  General appearance: alert, cooperative and no distress Heart: regular rate and rhythm Lungs: diminished breath sounds bibasilar Abdomen: soft, non-tender; bowel sounds normal; no masses,  no organomegaly Wound: clean and dry  Lab Results:  Recent Labs  08/13/14 0226 08/14/14 0255  WBC 9.0 8.9  HGB 12.6* 12.5*  HCT 37.6* 37.1*  PLT 213 234   BMET:  Recent Labs  08/13/14 0226 08/14/14 0255  NA 137 140  K 3.6 3.6  CL 102 105  CO2 26 29  GLUCOSE 98 108*  BUN 17 19  CREATININE 1.12 0.96  CALCIUM 8.5 8.6    PT/INR: No results for input(s): LABPROT, INR in the last 72 hours. ABG    Component Value Date/Time   PHART 7.388 08/11/2014 0429   HCO3 23.5 08/11/2014 0429   TCO2 25 08/11/2014 0429   ACIDBASEDEF 1.0 08/11/2014 0429   O2SAT 94.0 08/11/2014 0429   CBG (last 3)   Recent Labs  08/12/14 1543  GLUCAP 119*    Assessment/Plan: S/P Procedure(s) (LRB): VIDEO ASSISTED THORACOSCOPY (VATS)/ LOBECTOMY (Left) VIDEO BRONCHOSCOPY (N/A)  1. Chest tube- no air leak appreciated, maybe some tidaling present- 80 cc output yesterday- can likely d/c final chest tube  today 2. Pulm- wean oxygen as tolerated 3. GI-constipation, will order Lactulose, start Miralax daily tomorrow 4.Dispo- patient stable, work on constipation today, hopefully d/c final chest tube   LOS: 4 days    BARRETT, ERIN 08/14/2014  Remains weak and intermittently confused-  Stop Oxycodone DC chest tube Plan home Wed PT consult patient examined and medical record reviewed,agree with above note. VAN TRIGT III,PETER 08/14/2014

## 2014-08-14 NOTE — Progress Notes (Signed)
Pt got OOB to chair and only stayed for 2 minutes was encouraged to stay up said he will later, will continue to monitor.

## 2014-08-14 NOTE — Discharge Summary (Signed)
Physician Discharge Summary  Patient ID: Jeremy Deleon MRN: [id number] DOB/AGE: [birth year] 79 y.o.  Admit date: 08/10/2014 Discharge date: 08/16/2014  Admission Diagnoses:  Patient Active Problem List   Diagnosis Date Noted  . Squamous cell lung cancer : Left lower lobe  08/03/2014  . Preoperative cardiovascular examination 08/03/2014  . Dyslipidemia, goal LDL below 70 10/25/2013  . CAD S/P percutaneous coronary angioplasty -- Taxus DES in Prox LAD 10/25/2013  . Essential hypertension    Discharge Diagnoses:   Patient Active Problem List   Diagnosis Date Noted  . S/P lobectomy of lung 08/10/2014  . Squamous cell lung cancer : Left lower lobe  08/03/2014  . Preoperative cardiovascular examination 08/03/2014  . Dyslipidemia, goal LDL below 70 10/25/2013  . CAD S/P percutaneous coronary angioplasty -- Taxus DES in Prox LAD 10/25/2013  . Essential hypertension    Discharged Condition: good  History of present illness:   Jeremy Deleon is a 81 yo white male with known history of nicotine abuse.  He was recently diagnosed with a 5 cm mass in the medial segment of the left lower lobe.  He originally presented to his PCP with complaints of chest soreness.  CT scan of the chest was ordered and showed the presence of a mass.  PET CT was also order which showed the mass to be hypermetabolic.  He underwent CT guided biopsy of the mass which revealed the presence of Squamous cell carcinoma.  Due to these findings he was referred to [facility] for surgical evaluation.  He was evaluated by Dr. Prescott Gum on 08/02/2014.  At that time the patient denied hemoptysis, weight changes, fevers, chills, or night sweats.  It was felt he should undergo surgical intervention.  The risks and benefits of the procedure were explained to the patient and he was agreeable to proceed.      Hospital Course:   Jeremy Deleon presented to [facility] on 08/10/2014.  He was taken to the operating room and underwent Left Thoracotomy  with Left Lower Lobectomy and Mediastinal Lymph Node Dissection.  He tolerated the procedure without difficulty, was extubated and taken to the SICU in stable condition.  The patient has done well post operatively.  His chest tubes were weaned off suction and removed once appropriate.  He was ambulating without much difficulty.  He did complain of constipation and was treated with laxative which provided relief.  He continues to do well.  His pain is well controlled.  He is tolerating a regular diet.  Should no issues arise we anticipate discharge home 08/15/2014.  Significant Diagnostic Studies: Pathology  1. Lung, resection (segmental or lobe), left lower lobe - SQUAMOUS CELL CARCINOMA, 6.2 CM. - MARGINS NOT INVOLVED. - THREE BENIGN LYMPH NODES (0/3). 2. Lymph node, biopsy, n1 - ANTHRACOTIC LYMPH NODE. - NO TUMOR IDENTIFIED. 3. Lymph node, biopsy, n1,#2 - ANTHRACOTIC LYMPH NODE. - NO TUMOR IDENTIFIED. 4. Lymph node, biopsy, Aortic - ANTHRACOTIC LYMPH NODE. - NO TUMOR IDENTIFIED. 5. Lymph node, biopsy, AP window - ANTHRACOTIC LYMPH NODE. - NO TUMOR IDENTIFIED. 6. Lymph node, biopsy, L11 - ANTHRACOTIC LYMPH NODE. - NO TUMOR IDENTIFIED.  Treatments: surgery:  1. Left Thoracotomy with left lower lobectomy. 2. Mediastinal lymph node dissection. 3. Video bronchoscopy.  Disposition: Home  Discharge Medications:     Medication List    TAKE these medications        acetaminophen 325 MG tablet  Commonly known as:  TYLENOL  Take 650 mg by mouth every  6 (six) hours as needed (pain).     aspirin EC 325 MG tablet  Take 325 mg by mouth daily.     fenofibrate 145 MG tablet  Commonly known as:  TRICOR  TAKE 1 TABLET BY MOUTH EVERY DAY     isosorbide mononitrate 30 MG 24 hr tablet  Commonly known as:  IMDUR  Take 1 tablet (30 mg total) by mouth daily.     metoprolol succinate 25 MG 24 hr tablet  Commonly known as:  TOPROL XL  Take 1 tablet (25 mg total) by mouth daily.      simvastatin 40 MG tablet  Commonly known as:  ZOCOR  TAKE 1 TABLET EVERY DAY     traMADol 50 MG tablet  Commonly known as:  ULTRAM  Take 50 mg by mouth every 6 (six) hours as needed for moderate pain.     valsartan-hydrochlorothiazide 160-12.5 MG per tablet  Commonly known as:  DIOVAN-HCT  TAKE 1 TABLET BY MOUTH EVERY DAY (NEED REFILLS)          Follow-up Information    Follow up with VAN Wilber Oliphant, MD On 08/30/2014.   Specialty:  Cardiothoracic Surgery   Why:  Appointment is at 1030   Contact information:   [address] [place] [address] [phone]       Follow up with [facility] On 08/30/2014.   Why:  Please get CXR at 9:30   Contact information:   [place]       Follow up with [facility] On 08/23/2014.   Specialty:  Cardiothoracic Surgery   Why:  At 10:00 for suture removal with nurse   Contact information:   [place], [place] [phone]      Signed: Ellwood Handler 08/16/2014, 9:47 AM

## 2014-08-14 NOTE — Progress Notes (Signed)
Last remaining chest tube removed per MD order. Pt tolerated procedure well. Will continue to monitor.

## 2014-08-14 NOTE — Discharge Instructions (Signed)
Thoracotomy, Care After Refer to this sheet in the next few weeks. These instructions provide you with information on caring for yourself after your procedure. Your health care provider may also give you more specific instructions. Your treatment has been planned according to current medical practices, but problems sometimes occur. Call your health care provider if you have any problems or questions after your procedure. WHAT TO EXPECT AFTER YOUR PROCEDURE After your procedure, it is typical to have the following sensations:  You may feel pain at the incision site.  You may be constipated from the pain medicine given and the change in your level of activity.  You may feel extremely tired. HOME CARE INSTRUCTIONS  Take over-the-counter or prescription medicines for pain, discomfort, or fever only as directed by your health care provider. It is very important to take pain relieving medicine before your pain becomes severe. You will be able to breathe and cough more comfortably if your pain is well controlled.  Take deep breaths. Deep breathing helps to keep your lungs inflated and protects against a lung infection (pneumonia).  Cough frequently. Even though coughing may cause discomfort, coughing is important to clear mucus (phlegm) and expand your lungs. Coughing helps prevent pneumonia. If it hurts to cough, hold a pillow against your chest when you cough. This may help with the discomfort.  Continue to use an incentive spirometer as directed. The use of an incentive spirometer helps to keep your lungs inflated and protects against pneumonia.  Change the bandages over your incision as needed or as directed by your health care provider.  Remove the bandages over your chest tube site as directed by your health care provider.  Resume your normal diet as directed. It is important to have adequate protein, calories, vitamins, and minerals to promote healing.  Prevent constipation.  Eat  high-fiber foods such as whole grain cereals and breads, brown rice, beans, and fresh fruits and vegetables.  Drink enough water and fluids to keep your urine clear or pale yellow. Avoid drinking beverages containing caffeine. Beverages containing caffeine can cause dehydration and harden your stool.  Talk to your health care provider about taking a stool softener or laxative.  Avoid lifting until you are instructed otherwise.  Do not drive until directed by your health care provider.  Do not drive while taking pain medicines (narcotics).  Do not bathe, swim, or use a hot tub until directed by your health care provider. You may shower instead. Gently wash the area of your incision with water and soap as directed. Do not use anything else to clean your incision except as directed by your health care provider.  Do not use any tobacco products including cigarettes, chewing tobacco, or electronic cigarettes.  Avoid secondhand smoke.  Schedule an appointment for stitch (suture) or staple removal as directed.  Schedule and attend all follow-up visits as directed by your health care provider. It is important to keep all your appointments.  Participate in pulmonary rehabilitation as directed by your health care provider.  Do not travel by airplane for 2 weeks after your chest tube is removed. SEEK MEDICAL CARE IF:  You are bleeding from your wounds.  Your heartbeat seems irregular.  You have redness, swelling, or increasing pain in the wounds.  There is pus coming from your wounds.  There is a bad smell coming from the wound or dressing.  You have a fever or chills.  You have nausea or are vomiting.  You have muscle aches. SEEK  IMMEDIATE MEDICAL CARE IF:  You have a rash.  You have difficulty breathing.  You have a reaction or side effect to medicines given.  You have persistent nausea.  You have lightheadedness or feel faint.  You have shortness of breath or chest  pain.  You have persistent pain. Document Released: 12/20/2010 Document Revised: 07/12/2013 Document Reviewed: 02/23/2013 [facility] Patient Information 2015 [place], [place]. This information is not intended to replace advice given to you by your health care provider. Make sure you discuss any questions you have with your health care provider.

## 2014-08-15 ENCOUNTER — Inpatient Hospital Stay (HOSPITAL_COMMUNITY): Payer: Medicare Other

## 2014-08-15 MED ORDER — VALSARTAN-HYDROCHLOROTHIAZIDE 160-12.5 MG PO TABS: 1.0000 | ORAL_TABLET | Freq: Every day | ORAL | Status: DC

## 2014-08-15 MED ORDER — VALSARTAN 160 MG PO TABS
160.0000 mg | ORAL_TABLET | Freq: Every day | ORAL | Status: DC
  Administered 2014-08-16: 160 mg via ORAL
  Filled 2014-08-15 (×2): qty 1

## 2014-08-15 MED ORDER — HYDROCHLOROTHIAZIDE 12.5 MG PO CAPS
12.5000 mg | ORAL_CAPSULE | Freq: Every day | ORAL | Status: DC
  Administered 2014-08-16: 12.5 mg via ORAL
  Filled 2014-08-15 (×2): qty 1

## 2014-08-15 NOTE — Progress Notes (Addendum)
[facility] DAILY ICU PROGRESS NOTE                   [address]            [place] [address]          [phone]   5 Days Post-Op Procedure(s) (LRB): VIDEO ASSISTED THORACOSCOPY (VATS)/ LOBECTOMY (Left) VIDEO BRONCHOSCOPY (N/A)  Total Length of Stay:  LOS: 5 days   Subjective: Feels ok, had BM this am   Objective: Vital signs in last 24 hours: Temp:  [97.4 F (36.3 C)-98.3 F (36.8 C)] 98.3 F (36.8 C) (01/26 0700) Pulse Rate:  [91-100] 92 (01/26 0400) Cardiac Rhythm:  [-] Normal sinus rhythm (01/26 0300) Resp:  [15-24] 15 (01/26 0400) BP: (121-146)/(61-78) 121/63 mmHg (01/26 0400) SpO2:  [96 %-100 %] 100 % (01/26 0400)  Filed Weights   08/11/14 0800 08/13/14 0500  Weight: 195 lb 11.2 oz (88.769 kg) 193 lb (87.544 kg)    Weight change:    Hemodynamic parameters for last 24 hours:    Intake/Output from previous day: 01/25 0701 - 01/26 0700 In: 0  Out: 378 [Urine:377; Stool:1]  Intake/Output this shift:    Current Meds: Scheduled Meds: . aspirin EC  325 mg Oral Daily  . bisacodyl  10 mg Oral Daily  . enoxaparin (LOVENOX) injection  30 mg Subcutaneous Q24H  . feeding supplement (ENSURE COMPLETE)  237 mL Oral TID WC  . fenofibrate  160 mg Oral Daily  . ketorolac  15 mg Intravenous Once  . metoprolol succinate  25 mg Oral Daily  . polyethylene glycol  17 g Oral Daily  . senna-docusate  1 tablet Oral QHS  . simvastatin  40 mg Oral q1800   Continuous Infusions: . dextrose 5 % and 0.45% NaCl Stopped (08/13/14 0800)   PRN Meds:.acetaminophen, lactulose, levalbuterol, ondansetron (ZOFRAN) IV, potassium chloride, traMADol  General appearance: alert, cooperative and no distress Heart: regular rate and rhythm Lungs: some upper airway congestion Abdomen: benign Extremities: no edema Wound: incis healing well  Lab Results: CBC: Recent Labs  08/13/14 0226 08/14/14 0255  WBC 9.0 8.9  HGB 12.6* 12.5*  HCT 37.6* 37.1*  PLT 213 234    BMET:  Recent Labs  08/13/14 0226 08/14/14 0255  NA 137 140  K 3.6 3.6  CL 102 105  CO2 26 29  GLUCOSE 98 108*  BUN 17 19  CREATININE 1.12 0.96  CALCIUM 8.5 8.6    PT/INR: No results for input(s): LABPROT, INR in the last 72 hours. Radiology: Dg Chest Port 1 View  08/14/2014   CLINICAL DATA:  Post lobectomy.  EXAM: PORTABLE CHEST - 1 VIEW  COMPARISON:  07/25/2014; 08/11/2014; 08/13/2014; 08/14/2014  FINDINGS: Grossly unchanged enlarged cardiac silhouette and mediastinal contours with postsurgical change of the left hilum. Stable position of support apparatus. No definite pneumothorax. Grossly unchanged left-sided volume loss with associated elevation the left hemidiaphragm. Trace bilateral effusions are not excluded. Grossly unchanged bibasilar opacities, left greater than right, likely atelectasis. No evidence of edema. Unchanged bones.  IMPRESSION: 1.  Stable positioning of support apparatus.  No pneumothorax. 2. Stable postsurgical change of the left hilum with associated left lung volume loss. 3. Grossly unchanged bibasilar opacities, left greater than right, likely atelectasis.   Electronically Signed   By: Sandi Mariscal M.D.   On: 08/14/2014 07:56     Assessment/Plan: S/P Procedure(s) (LRB): VIDEO ASSISTED THORACOSCOPY (VATS)/ LOBECTOMY (Left) VIDEO BRONCHOSCOPY (N/A)  1 conts to do  well, going for CXR now 2 cont to push rehab and pulm toilet 3 some HTN restart home  Diovan    GOLD,WAYNE E 08/15/2014 8:13 AM  Small apical pntx on CXR today Repeat in am  Poss home tomorrow  patient examined and medical record reviewed,agree with above note. VAN TRIGT III,Jodi Criscuolo 08/15/2014

## 2014-08-15 NOTE — Progress Notes (Signed)
Medicare Important Message given? YES  (If response is "NO", the following Medicare IM given date fields will be blank)  Date Medicare IM given: 08/15/14 Medicare IM given by:  Dahlia Client [employer]

## 2014-08-15 NOTE — Evaluation (Signed)
Physical Therapy Evaluation Patient Details Name: Jeremy Deleon MRN: [id number] DOB: 1932/09/25 Today's Date: 08/15/2014   History of Present Illness  Patient is 81 yo male s/p VATS and lobectomy.  Clinical Impression  Patient demonstrates deficits in functional mobility as indicated below. Will need continued skilled PT to address deficits and maximize function. Will see as indicated and progress as tolerated. Recommend HHPT upon acute discharge. OF NOTE: VSS, BP 114/64 prior to ambulation, 94% on room air    Follow Up Recommendations Home health PT;Supervision - Intermittent    Equipment Recommendations  None recommended by PT    Recommendations for Other Services       Precautions / Restrictions Precautions Precautions: Fall Restrictions Weight Bearing Restrictions: No      Mobility  Bed Mobility Overal bed mobility: Needs Assistance Bed Mobility: Supine to Sit     Supine to sit: Min guard     General bed mobility comments: increased time, no physical assist required  Transfers Overall transfer level: Needs assistance Equipment used: Rolling walker (2 wheeled);None Transfers: Sit to/from [employer] to Stand: Min assist;Min guard Stand pivot transfers: Min assist       General transfer comment: Min assist for stability and line management, min guard with RW  Ambulation/Gait Ambulation/Gait assistance: Supervision Ambulation Distance (Feet): 210 Feet Assistive device: Rolling walker (2 wheeled) Gait Pattern/deviations: Step-through pattern;Drifts right/left;Trunk flexed Gait velocity: decreased Gait velocity interpretation: Below normal speed for age/gender General Gait Details: VCs for upright posture and cadence  Stairs            Wheelchair Mobility    Modified Rankin (Stroke Patients Only)       Balance Overall balance assessment: Needs assistance           Standing balance-Leahy Scale: Fair Standing balance  comment: patient able to perform some dynamic standing activities during pericare, easily fatigued                             Pertinent Vitals/Pain Pain Assessment: No/denies pain    Home Living Family/patient expects to be discharged to:: Private residence (with daughter) Living Arrangements: Alone Available Help at Discharge: Family Type of Home: House Home Access: Stairs to enter Entrance Stairs-Rails: None Entrance Stairs-Number of Steps: 2 Home Layout: Able to live on main level with bedroom/bathroom Home Equipment: Walker - 2 wheels;Bedside commode;Toilet riser      Prior Function Level of Independence: Independent with assistive device(s)         Comments: uses rolling walker     Hand Dominance   Dominant Hand: Right    Extremity/Trunk Assessment   Upper Extremity Assessment: Defer to OT evaluation           Lower Extremity Assessment: Generalized weakness         Communication   Communication: HOH  Cognition Arousal/Alertness: Awake/alert Behavior During Therapy: WFL for tasks assessed/performed Overall Cognitive Status: Within Functional Limits for tasks assessed                      General Comments      Exercises        Assessment/Plan    PT Assessment Patient needs continued PT services  PT Diagnosis Difficulty walking;Generalized weakness   PT Problem List Decreased strength;Decreased activity tolerance;Decreased balance;Decreased mobility;Cardiopulmonary status limiting activity;Pain  PT Treatment Interventions DME instruction;Gait training;Stair training;Functional mobility training;Therapeutic activities;Therapeutic exercise;Balance training;Patient/family education   PT  Goals (Current goals can be found in the Care Plan section) Acute Rehab PT Goals Patient Stated Goal: to go home to daughters PT Goal Formulation: With patient/family Time For Goal Achievement: 08/29/14 Potential to Achieve Goals: Good     Frequency Min 3X/week   Barriers to discharge        Co-evaluation               End of Session Equipment Utilized During Treatment: Gait belt Activity Tolerance: Patient tolerated treatment well;Patient limited by fatigue Patient left: in chair;with call bell/phone within reach Nurse Communication: Mobility status         Time: 2992-4268 PT Time Calculation (min) (ACUTE ONLY): 25 min   Charges:   PT Evaluation $Initial PT Evaluation Tier I: 1 Procedure PT Treatments $Gait Training: 8-22 mins   PT G CodesDuncan Dull 2014/09/08, 6:09 PM Alben Deeds, [place] DPT  [phone]

## 2014-08-16 ENCOUNTER — Inpatient Hospital Stay (HOSPITAL_COMMUNITY): Payer: Medicare Other

## 2014-08-16 ENCOUNTER — Encounter: Payer: Self-pay | Admitting: *Deleted

## 2014-08-16 NOTE — Progress Notes (Addendum)
      [address]       [place] [address]             [phone]      6 Days Post-Op Procedure(s) (LRB): VIDEO ASSISTED THORACOSCOPY (VATS)/ LOBECTOMY (Left) VIDEO BRONCHOSCOPY (N/A)   Subjective:  Jeremy Deleon states he feels pretty good this morning.  He is hopeful to be discharged today.  + Ambulation,  + BM  Objective: Vital signs in last 24 hours: Temp:  [97.4 F (36.3 C)-98.4 F (36.9 C)] 98.4 F (36.9 C) (01/27 0409) Pulse Rate:  [78-95] 78 (01/27 0410) Cardiac Rhythm:  [-] Normal sinus rhythm (01/27 0015) Resp:  [14-18] 15 (01/27 0410) BP: (107-131)/(48-67) 121/61 mmHg (01/27 0410) SpO2:  [93 %-100 %] 96 % (01/27 0410)  Intake/Output from previous day: 01/26 0701 - 01/27 0700 In: 817 [P.O.:817] Out: 350 [Urine:350]  General appearance: alert, cooperative and no distress Heart: regular rate and rhythm Lungs: clear to auscultation bilaterally Abdomen: soft, non-tender; bowel sounds normal; no masses,  no organomegaly Wound: clean and dry   Lab Results:  Recent Labs  08/14/14 0255  WBC 8.9  HGB 12.5*  HCT 37.1*  PLT 234   BMET:  Recent Labs  08/14/14 0255  NA 140  K 3.6  CL 105  CO2 29  GLUCOSE 108*  BUN 19  CREATININE 0.96  CALCIUM 8.6    PT/INR: No results for input(s): LABPROT, INR in the last 72 hours. ABG    Component Value Date/Time   PHART 7.388 08/11/2014 0429   HCO3 23.5 08/11/2014 0429   TCO2 25 08/11/2014 0429   ACIDBASEDEF 1.0 08/11/2014 0429   O2SAT 94.0 08/11/2014 0429   CBG (last 3)  No results for input(s): GLUCAP in the last 72 hours.  Assessment/Plan: S/P Procedure(s) (LRB): VIDEO ASSISTED THORACOSCOPY (VATS)/ LOBECTOMY (Left) VIDEO BRONCHOSCOPY (N/A)  1. Pulm- off oxygen, CXR reviewed Pneumothorax is stable, continue IS 2. CV- HTN improved- continue home HCTZ, Topro, and Diovan 3. Dispo- patient is stable, pneumothorax still present, however patient asymptomatic, will d/c home today if okay with  Dr. Prescott Gum   LOS: 6 days    Jeremy Deleon 08/16/2014  Plan DC home  patient examined and medical record reviewed,agree with above note. Jeremy Deleon,Jeremy Deleon 08/16/2014

## 2014-08-16 NOTE — Progress Notes (Signed)
[name] to be D/C'd Home  per MD order with home health care RN/PT and with son.  Discussed with the patient and all questions fully answered.    Medication List    TAKE these medications        acetaminophen 325 MG tablet  Commonly known as:  TYLENOL  Take 650 mg by mouth every 6 (six) hours as needed (pain).     aspirin EC 325 MG tablet  Take 325 mg by mouth daily.     fenofibrate 145 MG tablet  Commonly known as:  TRICOR  TAKE 1 TABLET BY MOUTH EVERY DAY     isosorbide mononitrate 30 MG 24 hr tablet  Commonly known as:  IMDUR  Take 1 tablet (30 mg total) by mouth daily.     metoprolol succinate 25 MG 24 hr tablet  Commonly known as:  TOPROL XL  Take 1 tablet (25 mg total) by mouth daily.     simvastatin 40 MG tablet  Commonly known as:  ZOCOR  TAKE 1 TABLET EVERY DAY     traMADol 50 MG tablet  Commonly known as:  ULTRAM  Take 50 mg by mouth every 6 (six) hours as needed for moderate pain.     valsartan-hydrochlorothiazide 160-12.5 MG per tablet  Commonly known as:  DIOVAN-HCT  TAKE 1 TABLET BY MOUTH EVERY DAY (NEED REFILLS)        VVS, Skin clean, dry and intact without evidence of skin break down, no evidence of skin tears noted. IV catheter discontinued intact. Site without signs and symptoms of complications. Dressing and pressure applied.  An After Visit Summary was printed and given to the patient.  D/c education completed with patient/family including follow up instructions, medication list, d/c activities limitations if indicated, with other d/c instructions as indicated by MD - patient able to verbalize understanding, all questions fully answered.   Patient instructed to return to ED, call 911, or call MD for any changes in condition.   Patient escorted via [place], and D/C home via private auto.  Pecolia Ades [facility] 08/16/2014 12:36 PM

## 2014-08-16 NOTE — Care Management Note (Signed)
    Page 1 of 1   08/16/2014     10:43:13 AM CARE MANAGEMENT NOTE 08/16/2014  Patient:  EBB, CARELOCK   Account Number:  [id number]  Date Initiated:  08/10/2014  Documentation initiated by:  [facility]  Subjective/Objective Assessment:   Admitted post op VATS and lobectomy.     Action/Plan:   Anticipated DC Date:  08/15/2014   Anticipated DC Plan:  [place]  CM consult      [facility] Choice  HOME HEALTH   Choice offered to / List presented to:  C-1 Patient        [place] arranged  HH-1 RN  [place].   Status of service:  Completed, signed off Medicare Important Message given?  YES (If response is "NO", the following Medicare IM given date fields will be blank) Date Medicare IM given:  08/15/2014 Medicare IM given by:  Marvetta Gibbons Date Additional Medicare IM given:   Additional Medicare IM given by:    Discharge Disposition:  [place]  Per UR Regulation:  Reviewed for med. necessity/level of care/duration of stay  If discussed at [place] of Stay Meetings, dates discussed:    Comments:  ContactHoy Register Daughter [phone]   cell- [phone]  08/16/14- 1030- Marvetta Gibbons RN, BSN [phone] Pt for d/c home today, orders for HH-RN/PT- spoke with pt at bedside- per conversation pt agreeable- choice for [facility] agency given for [place]-per pt he would like to use [facility] for services- referral called to Butch Penny with [facility] for HH-RN/PT- pt to be staying with daughter- [facility] to call her for address.

## 2014-08-16 NOTE — [facility] Oncology Navigator Note (Unsigned)
Received referral on patient last week..  Patient is still in the hospital at this time.  I will schedule once he is discharged.

## 2014-08-18 ENCOUNTER — Telehealth: Payer: Self-pay | Admitting: *Deleted

## 2014-08-18 ENCOUNTER — Other Ambulatory Visit: Payer: Self-pay | Admitting: *Deleted

## 2014-08-18 DIAGNOSIS — C3492 Malignant neoplasm of unspecified part of left bronchus or lung: Secondary | ICD-10-CM

## 2014-08-18 NOTE — Telephone Encounter (Signed)
Patient's daughter called and left me a vm to call for appt. I called and spoke with Jeremy Deleon.  I have her appt for thoracic clinic on 08/24/14 arrival at 12:30.  She verbalized understanding of appt time and place.

## 2014-08-18 NOTE — Telephone Encounter (Signed)
Called patient's daughter regarding appt.  Patient is staying with her.  She asked that I call back.

## 2014-08-18 NOTE — Telephone Encounter (Signed)
Called left vm message to call with appt

## 2014-08-22 ENCOUNTER — Telehealth: Payer: Self-pay | Admitting: *Deleted

## 2014-08-22 NOTE — Telephone Encounter (Signed)
Called daughter due to patient is staying with her.  I called to update regarding appt for thoracic clinic on 08/24/14.  I left vm message

## 2014-08-23 ENCOUNTER — Other Ambulatory Visit: Payer: Self-pay | Admitting: *Deleted

## 2014-08-23 ENCOUNTER — Ambulatory Visit: Payer: Self-pay | Admitting: *Deleted

## 2014-08-23 DIAGNOSIS — K5900 Constipation, unspecified: Secondary | ICD-10-CM

## 2014-08-23 DIAGNOSIS — C3432 Malignant neoplasm of lower lobe, left bronchus or lung: Secondary | ICD-10-CM

## 2014-08-23 DIAGNOSIS — Z9889 Other specified postprocedural states: Secondary | ICD-10-CM

## 2014-08-23 DIAGNOSIS — Z4802 Encounter for removal of sutures: Secondary | ICD-10-CM

## 2014-08-23 DIAGNOSIS — Z902 Acquired absence of lung [part of]: Secondary | ICD-10-CM

## 2014-08-23 MED ORDER — LACTULOSE 10 GM/15ML PO SOLN: 20.0000 g | Freq: Every day | ORAL | Status: DC | PRN

## 2014-08-23 NOTE — Progress Notes (Signed)
Mr. Orlick returned for suture removal of two previous chest tube sites, which was easily done.  These sites asa well as his left thoracotomy incision are very well healed.  He is having minimal pain. He is requesting the medication he was given in the hospital as a stool softener.  This was Lactulose.  Dr. Prescott Gum saw him this visit and ordered the Lactulose and it was e-prescribed to his pharmacy.  He will return next week with a cxr.

## 2014-08-24 ENCOUNTER — Ambulatory Visit: Payer: Medicare Other | Admitting: Physical Therapy

## 2014-08-24 ENCOUNTER — Encounter: Payer: Self-pay | Admitting: *Deleted

## 2014-08-24 ENCOUNTER — Encounter: Payer: Self-pay | Admitting: Internal Medicine

## 2014-08-24 ENCOUNTER — Telehealth: Payer: Self-pay | Admitting: Internal Medicine

## 2014-08-24 ENCOUNTER — Ambulatory Visit (HOSPITAL_BASED_OUTPATIENT_CLINIC_OR_DEPARTMENT_OTHER): Payer: Medicare Other | Admitting: Internal Medicine

## 2014-08-24 VITALS — BP 132/61 | HR 75 | Resp 18 | Ht 74.0 in | Wt 188.9 lb

## 2014-08-24 DIAGNOSIS — C3432 Malignant neoplasm of lower lobe, left bronchus or lung: Secondary | ICD-10-CM | POA: Diagnosis not present

## 2014-08-24 DIAGNOSIS — C3492 Malignant neoplasm of unspecified part of left bronchus or lung: Secondary | ICD-10-CM

## 2014-08-24 MED ORDER — PROCHLORPERAZINE MALEATE 10 MG PO TABS: 10.0000 mg | ORAL_TABLET | Freq: Four times a day (QID) | ORAL | Status: DC | PRN

## 2014-08-24 NOTE — Progress Notes (Signed)
[place] [phone]   [phone] Multidisciplinary thoracic oncology clinic  CONSULT NOTE  REFERRING PHYSICIAN: Dr. Tharon Aquas Trigt.  REASON FOR CONSULTATION:  81 years old white male recently diagnosed with lung cancer  HPI Jeremy Deleon is a 81 y.o. male with past medical history significant for coronary artery disease status post stent placement, hypertension, dyslipidemia as well as remote history of smoking but quit 30 years ago. The patient was complaining of soreness and pain on the anterior left chest wall and left shoulder. He was seen by his primary care physician at [facility]. Chest x-ray and CT scan of the chest performed at that time showed 5 cm mass in the left lower lobe. A PET scan was performed on 07/03/2014 and it showed a 5.3 x 4.7 cm hypermetabolic mass within the left lower lobe. The mass was intensely hypermetabolic with SUV 8 quit of 26.3. The mass abuts the pleura along the descending thoracic aorta. There was no evidence of mediastinal metastasis or distant metastatic disease. CT biopsy of the left lower lobe mass was performed on 07/25/2014 and the final pathology (Accession: [id number]) was consistent with squamous cell carcinoma. MRI of the brain on 08/03/2014 showed no evidence for acute or metastatic intracranial abnormality. The patient was referred to Dr. Prescott Gum and on 08/10/2014, he underwent left VATS with left lower lobectomy and mediastinal lymph node dissection. The final pathology (Accession: [id number]) was consistent with squamous cell carcinoma measuring 6.2 cm with no visceral pleural involvement or lymphovascular invasion.  The patient is recovering well from his surgery. He was referred to me today for evaluation and recommendation regarding treatment of his condition. When seen today he continues to complain of soreness in the left side of the chest after the surgical resection as well as mild shortness of  breath but no cough or hemoptysis. The patient lost around 11 pounds in the last few months. He denied having any significant nausea or vomiting, no fever or chills. He has no headache or blurry vision. Family history significant for mother with throat cancer and father died from heart disease at age 25. The patient is a widow and has 3 children. He was accompanied today by his daughter Jeremy Deleon and his daughter-in-law Jeremy Deleon. He used to work in Secondary school teacher. He has a history of smoking less than one pack per day for around 30 years but quit 30 years ago. He has no history of alcohol or drug abuse. HPI  Past Medical History  Diagnosis Date  . CAD S/P percutaneous coronary angioplasty January 2006    Abnormal Myoview with inferolateral ischemia: --> 80% prox LAD lesion PCI: Taxus DES 3.0 mm x 16 mm-;; Myoview June 2009 no ischemia or infarction with attenuation artifact  . Hypertension   . Dyslipidemia, goal LDL below 70     on simvastatin and tricor  . Osteoarthritis   . BPH (benign prostatic hyperplasia)   . Squamous cell lung cancer : Left lower lobe  08/03/2014    Bronchoscopy showed extrinsic compression of the left lower lobe with endobronchial biopsies were negative. He subsequently had a needle biopsy which diagnosed the mass as squamous cell carcinoma   . GERD (gastroesophageal reflux disease)     Past Surgical History  Procedure Laterality Date  . Nm myoview ltd  07/2004    inferolatera wall ischemia  . Nm myocar perf wall motion  January 06, 2008     treadmill  [place] -  tissue attenuation but no ischemia or infarction  . Doppler echocardiography  June 18 ,2009    mild concentric LVH, NORMAL ef, MILDLY  DILATED LEFT ATRIUM  AND MILD SCLEROTIC  AORTIC  VALVE AND OTHERWISE  NORMAL  . Cardiac catheterization  07/2004    80% proximal LAD lesion--PCI, EF 60%  . Percutaneous coronary stent intervention (pci-s)  2/27/ 2006    TAXUS DES 3.0 mm x 16 mm stent ->  PROX   LAD  . Renal doppler  05/03/2007    rgt and lft renal arteries normal patency; rgt and lft kidneys normal size  . Umbilical hernia repair    . Appendectomy    . Cataract extraction    . Video assisted thoracoscopy (vats)/ lobectomy Left 08/10/2014    Procedure: VIDEO ASSISTED THORACOSCOPY (VATS)/ LOBECTOMY;  Surgeon: Ivin Poot, MD;  Location: [place];  Service: Thoracic;  Laterality: Left;  . Video bronchoscopy N/A 08/10/2014    Procedure: VIDEO BRONCHOSCOPY;  Surgeon: Ivin Poot, MD;  Location: [facility] OR;  Service: Thoracic;  Laterality: N/A;    Family History  Problem Relation Age of Onset  . Cancer Mother   . Heart disease Mother   . Heart disease Father   . Parkinsonism Brother     Social History History  Substance Use Topics  . Smoking status: Former Smoker    Types: Cigarettes    Quit date: 10/25/1982  . Smokeless tobacco: Not on file  . Alcohol Use: No    Allergies  Allergen Reactions  . Niaspan [Niacin Er] Other (See Comments)    flushing    Current Outpatient Prescriptions  Medication Sig Dispense Refill  . aspirin EC 325 MG tablet Take 325 mg by mouth daily.    . fenofibrate (TRICOR) 145 MG tablet TAKE 1 TABLET BY MOUTH EVERY DAY (Patient taking differently: Take 145 mg by mouth daily. ) 90 tablet 3  . isosorbide mononitrate (IMDUR) 30 MG 24 hr tablet Take 1 tablet (30 mg total) by mouth daily. 90 tablet 3  . lactulose (CHRONULAC) 10 GM/15ML solution Take 30 mLs (20 g total) by mouth daily as needed for mild constipation. 240 mL 1  . metoprolol succinate (TOPROL XL) 25 MG 24 hr tablet Take 1 tablet (25 mg total) by mouth daily. 90 tablet 3  . simvastatin (ZOCOR) 40 MG tablet TAKE 1 TABLET EVERY DAY (Patient taking differently: Take 40 mg by mouth daily at 6 PM. TAKE 1 TABLET EVERY DAY) 90 tablet 3  . traMADol (ULTRAM) 50 MG tablet Take 50 mg by mouth every 6 (six) hours as needed for moderate pain.    . valsartan-hydrochlorothiazide (DIOVAN-HCT) 160-12.5 MG  per tablet TAKE 1 TABLET BY MOUTH EVERY DAY (NEED REFILLS) 90 tablet 3  . acetaminophen (TYLENOL) 325 MG tablet Take 650 mg by mouth every 6 (six) hours as needed (pain).     No current facility-administered medications for this visit.    Review of Systems  Constitutional: positive for fatigue and weight loss Eyes: negative Ears, nose, mouth, throat, and face: negative Respiratory: positive for dyspnea on exertion and pleurisy/chest pain Cardiovascular: negative Gastrointestinal: negative Genitourinary:negative Integument/breast: negative Hematologic/lymphatic: negative Musculoskeletal:negative Neurological: negative Behavioral/Psych: negative Endocrine: negative Allergic/Immunologic: negative  Physical Exam  DPO:EUMPN, healthy, no distress, well nourished and well developed SKIN: skin color, texture, turgor are normal, no rashes or significant lesions HEAD: Normocephalic, No masses, lesions, tenderness or abnormalities EYES: normal, PERRLA EARS: External ears normal, Canals clear OROPHARYNX:no exudate, no erythema and  lips, buccal mucosa, and tongue normal  NECK: supple, no adenopathy, no JVD LYMPH:  no palpable lymphadenopathy, no hepatosplenomegaly LUNGS: clear to auscultation , and palpation HEART: regular rate & rhythm, no murmurs and no gallops ABDOMEN:abdomen soft, non-tender, normal bowel sounds and no masses or organomegaly BACK: Back symmetric, no curvature., No CVA tenderness EXTREMITIES:no joint deformities, effusion, or inflammation, no edema, no skin discoloration, no clubbing  NEURO: alert & oriented x 3 with fluent speech, no focal motor/sensory deficits  PERFORMANCE STATUS: ECOG 1  LABORATORY DATA: Lab Results  Component Value Date   WBC 8.9 08/14/2014   HGB 12.5* 08/14/2014   HCT 37.1* 08/14/2014   MCV 87.9 08/14/2014   PLT 234 08/14/2014      Chemistry      Component Value Date/Time   NA 140 08/14/2014 0255   K 3.6 08/14/2014 0255   CL 105  08/14/2014 0255   CO2 29 08/14/2014 0255   BUN 19 08/14/2014 0255   CREATININE 0.96 08/14/2014 0255      Component Value Date/Time   CALCIUM 8.6 08/14/2014 0255   ALKPHOS 49 08/14/2014 0255   AST 20 08/14/2014 0255   ALT 11 08/14/2014 0255   BILITOT 0.9 08/14/2014 0255       RADIOGRAPHIC STUDIES: Dg Chest 2 View  08/16/2014   CLINICAL DATA:  81 year old male with weakness. Chest tube removed a few days ago.  EXAM: CHEST  2 VIEW  COMPARISON:  Chest x-ray 08/15/2014.  FINDINGS: There continues to be a small volume of left-sided pleural effusion, and a very small left apical pneumothorax (pneumothorax component is less than 5% of the volume of the left hemithorax). Postoperative changes of left lower lobectomy are redemonstrated. Lung volumes are low. Bibasilar opacities (left greater than right), favored to reflect subsegmental atelectasis. Small calcified granuloma in the periphery of the right upper lobe is unchanged. Pulmonary vasculature is normal. Heart size and mediastinal contours are within normal limits.  IMPRESSION: 1. Postoperative changes of left lower lobectomy with small left hydropneumothorax, decreasing in size compared to prior study 08/15/2014, as above. 2. Low lung volumes with some bibasilar subsegmental atelectasis.   Electronically Signed   By: Vinnie Langton M.D.   On: 08/16/2014 08:07   Dg Chest 2 View  08/15/2014   CLINICAL DATA:  Chest tube, removed.  EXAM: CHEST - 2 VIEW  COMPARISON:  the previous day's study  FINDINGS: Interval removal of left chest tube. Small left apical pneumothorax is now evident, pleural margin projecting just below the posterior aspect left third rib. Small left pleural effusion. Atelectasis/ consolidation at the left lung base. Right lung clear. Heart size normal. Spurring in the mid and lower thoracic spine.  IMPRESSION: 1. Left chest tube removal with small apical pneumothorax.   Electronically Signed   By: Arne Cleveland M.D.   On:  08/15/2014 09:17   Dg Chest 2 View  08/08/2014   CLINICAL DATA:  New left lung squamous cell carcinoma. Preop respiratory exam.  EXAM: CHEST  2 VIEW  COMPARISON:  07/25/2014  FINDINGS: Central left lower lobe mass shows no significant change compared to recent exam. Calcified granuloma is again seen in the right upper and lower lung fields age shown on previous PET-CT. No evidence of pulmonary infiltrate or pleural effusion. Heart size remains normal  IMPRESSION: Central left lower lobe mass, without significant change. No new findings.   Electronically Signed   By: Earle Gell M.D.   On: 08/08/2014 12:54   Mr  Brain W Wo Contrast  08/03/2014   CLINICAL DATA:  81 year old male with new diagnosis of lung cancer. Intractable Headaches. Staging. Subsequent encounter.  EXAM: MRI HEAD WITHOUT AND WITH CONTRAST  TECHNIQUE: Multiplanar, multiecho pulse sequences of the brain and surrounding structures were obtained without and with intravenous contrast.  CONTRAST:  7mL MULTIHANCE GADOBENATE DIMEGLUMINE 529 MG/ML IV SOLN  COMPARISON:  PET-CT 06/29/2014  FINDINGS: No restricted diffusion to suggest acute infarction. No midline shift, mass effect, evidence of mass lesion, ventriculomegaly, extra-axial collection or acute intracranial hemorrhage. Cervicomedullary junction and pituitary are within normal limits. Major intracranial vascular flow voids are preserved.  No abnormal enhancement identified. Scattered and patchy nonspecific cerebral white matter T2 and FLAIR hyperintensity in both hemispheres. A lesion in the anterior right corona radiata most resembles a chronic lacunar infarct (series 7, image 16 and series 9, image 37. No cortical encephalomalacia. Deep gray matter nuclei, brainstem, and cerebellum within normal limits.  Visualized bone marrow signal is normal. Negative visualized upper cervical spine except for degenerative changes. Left mastoid effusion. Negative visualized nasopharynx. Other Visible  internal auditory structures appear normal. Mild to moderate paranasal sinus mucosal thickening. Postoperative changes to the right globe. Visualized scalp soft tissues are within normal limits.  IMPRESSION: 1.  No acute or metastatic intracranial abnormality. 2. Mild to moderate for age cerebral white matter signal changes, favor chronic small vessel disease. 3. Mild left mastoid effusion. Mild to moderate paranasal sinus inflammation.   Electronically Signed   By: Lars Pinks M.D.   On: 08/03/2014 08:55   Dg Chest Port 1 View  08/14/2014   CLINICAL DATA:  Post lobectomy.  EXAM: PORTABLE CHEST - 1 VIEW  COMPARISON:  07/25/2014; 08/11/2014; 08/13/2014; 08/14/2014  FINDINGS: Grossly unchanged enlarged cardiac silhouette and mediastinal contours with postsurgical change of the left hilum. Stable position of support apparatus. No definite pneumothorax. Grossly unchanged left-sided volume loss with associated elevation the left hemidiaphragm. Trace bilateral effusions are not excluded. Grossly unchanged bibasilar opacities, left greater than right, likely atelectasis. No evidence of edema. Unchanged bones.  IMPRESSION: 1.  Stable positioning of support apparatus.  No pneumothorax. 2. Stable postsurgical change of the left hilum with associated left lung volume loss. 3. Grossly unchanged bibasilar opacities, left greater than right, likely atelectasis.   Electronically Signed   By: Sandi Mariscal M.D.   On: 08/14/2014 07:56   Dg Chest Port 1 View  08/13/2014   CLINICAL DATA:  Status post lobectomy.  EXAM: PORTABLE CHEST - 1 VIEW  COMPARISON:  08/12/2013  FINDINGS: Stable positioning of 2 left-sided chest tubes. There is a tiny (Less than 5%) left apical pneumothorax which is newly seen.  Postoperative basilar atelectasis and small pleural effusions.  A pulmonary nodule in the right mid lung is calcified.  Unchanged cardiomegaly and aortic tortuosity.  IMPRESSION: Tiny left apical pneumothorax; left-sided chest tubes are  in stable position.   Electronically Signed   By: Jorje Guild M.D.   On: 08/13/2014 05:54   Dg Chest Port 1 View  08/12/2014   CLINICAL DATA:  Shortness of breath  EXAM: PORTABLE CHEST - 1 VIEW  COMPARISON:  08/11/2014  FINDINGS: Left-sided chest tube is in stable position. No evidence of pneumothorax. Stable elevation of the left diaphragm status post lobectomy. Heart size and mediastinal contours are also stable. There is a small right pleural effusion and mild basilar atelectasis. A calcified 1 cm nodule in the right lung is again noted.  IMPRESSION: 1. No pneumothorax or  change in chest tube positioning. 2. Postoperative atelectasis and trace right effusion.   Electronically Signed   By: Jorje Guild M.D.   On: 08/12/2014 05:19   Dg Chest Port 1 View  08/11/2014   CLINICAL DATA:  Left-sided lung carcinoma, status post video-assisted thoracoscopy  EXAM: PORTABLE CHEST - 1 VIEW  COMPARISON:  August 10, 2014  FINDINGS: Chest tube positions on the left are unchanged. No pneumothorax. There is volume loss in the left base. Lungs elsewhere clear. Heart is mildly enlarged with pulmonary vascularity within normal limits. No adenopathy.  IMPRESSION: No pneumothorax. Chest tube positions on the left are stable. Stable left base atelectasis. No new opacity. No change in cardiac silhouette.   Electronically Signed   By: Lowella Grip M.D.   On: 08/11/2014 06:29   Dg Chest Port 1 View  08/10/2014   CLINICAL DATA:  Post LEFT lower lobectomy  EXAM: PORTABLE CHEST - 1 VIEW  COMPARISON:  Portable exam 1221 hr compared to 08/08/2014  FINDINGS: Two new LEFT thoracostomy tubes.  Enlargement of cardiac silhouette.  Slight prominence of mediastinum likely related to portable slightly rotated technique.  Pulmonary vascularity normal.  Calcified granuloma RIGHT upper lobe.  Minimal LEFT pleural effusion and bibasilar atelectasis.  No definite pneumothorax.  No acute osseous findings.  IMPRESSION: No pneumothorax  following LEFT lower lobectomy and thoracostomy tube placement.  Bibasilar atelectasis and minimal LEFT pleural effusion.   Electronically Signed   By: Lavonia Dana M.D.   On: 08/10/2014 12:40    ASSESSMENT: This is a very pleasant 81 years old white male recently diagnosed with a stage IIA (T2b, N0, M0) non-small cell lung cancer consistent with squamous cell carcinoma diagnosed in January 2016 status post left lower lobectomy with lymph node dissection   PLAN: I had a lengthy discussion with the patient and his family today about his current disease is stage, prognosis and treatment options.  I explained to the patient that there is a survival benefit for adjuvant chemotherapy for patient with a stage IIa lung cancer in the range of 10% 5 year survival. I also gave the patient the option of observation and close monitoring. The patient is interested in considering adjuvant chemotherapy but he does not want to lose his hair or have any peripheral neuropathy as he continues to work. I recommended for him regimen consisting of carboplatin for AUC of 5 on day 1 and gemcitabine 1000 MG/M2 on days 1 and 8 every 3 weeks. I discussed with the patient the adverse effect of this treatment including mild alopecia, myelosuppression, nausea and vomiting as well as liver or renal dysfunction. I would give the patient few more weeks to recover from his recent surgery before starting the first cycle of his chemotherapy. He would have a chemotherapy education class before starting the first cycle of his treatment. He would come back for follow-up visit in one month's for reevaluation before starting his chemotherapy. I will call his pharmacy was prescription for Compazine 10 mg by mouth every 6 hours as needed for nausea. The patient was seen at the multidisciplinary thoracic oncology clinic today by medical oncology, thoracic navigator, physical therapist and social worker. He was advised to call immediately if he  has any concerning symptoms in the interval.  The patient voices understanding of current disease status and treatment options and is in agreement with the current care plan.  All questions were answered. The patient knows to call the clinic with any problems, questions or  concerns. We can certainly see the patient much sooner if necessary.  Thank you so much for allowing me to participate in the care of Jeremy Deleon. I will continue to follow up the patient with you and assist in his care.  I spent 55 minutes counseling the patient face to face. The total time spent in the appointment was 80 minutes.  Disclaimer: This note was dictated with voice recognition software. Similar sounding words can inadvertently be transcribed and may not be corrected upon review.   Lopaka Karge K. August 24, 2014, 2:13 PM

## 2014-08-24 NOTE — Telephone Encounter (Signed)
Pt confirmed labs/ov per 02/04 POF, gave pt AVS... KJ, added chemo edu class and sent msg to add chemo

## 2014-08-24 NOTE — Progress Notes (Signed)
[facility] Clinical Social Work  Clinical Social Work met with patient/family and medical oncologist at [facility] appointment to offer support and assess for psychosocial needs.  Medical oncologist reviewed patient's diagnosis and recommended treatment plan with patient/family. Patient shared he is currently recovering from surgical resection and is in agreement to proceed with chemotherapy as what he calls, "an insurance plan so the cancer won't come back".  Jeremy Deleon was accompanied by his daughter and sister-in-law [name].  Jeremy Deleon has no psychosocial needs at this time.   Clinical Social Work briefly discussed Clinical Social Work role and [facility] support programs/services.  Clinical Social Work encouraged patient to call with any additional questions or concerns.   Lauren Mullis, MSW, LCSW, OSW-C Clinical Social Worker [facility] [phone]  

## 2014-08-24 NOTE — Progress Notes (Signed)
   Thoracic Treatment Summary Name:Loic HUNTLEY KNOOP Date: 08/24/2014 DOB:Jun 03, 1933 Your Medical Team Medical Oncologist:Dr. Julien Nordmann Surgeon:Dr. VanTrigt Type and Stage of Lung Cancer Non-Small Cell Carcinoma: Squamous Cell   Clinical Stage:  Squamous cell lung cancer : Left lower lobe    Staging form: Lung, AJCC 7th Edition     Clinical stage from 08/24/2014: Stage IIA (T2b, N0, M0) - Signed by Curt Bears, MD on 08/24/2014       Staging comments: Squamous cell ca     Clinical stage is based on radiology exams.  Pathological stage will be determined after surgery.  Staging is based on the size of the tumor, involvement of lymph nodes or not, and whether or not the cancer center has spread. Recommendations Recommendations: Chemotherapy in a month  These recommendations are based on information available as of today's consult.  This is subject to change depending further testing or exams. Next Steps Next Step: Medical Oncology will set up follow up appointments Barriers to Care What do you perceive as a potential barrier that may prevent you from receiving your treatment plan? Product/process development scientist given and explained Support information for support services at [employer] given and explained  Visual merchandiser up with financial advocates   Resources Given: [place] on [employer] at [employer].Radonna Ricker [phone]    Questions Norton Blizzard, RN BSN Thoracic Oncology Nurse Navigator at [place] is a nurse navigator that is available to assist you through your cancer journey.  She can answer your questions and/or provide resources regarding your treatment plan, emotional support, or financial concerns.

## 2014-08-25 ENCOUNTER — Telehealth: Payer: Self-pay | Admitting: *Deleted

## 2014-08-25 NOTE — Telephone Encounter (Signed)
Per staff message and POF I have scheduled appts. Advised scheduler of appts. JMW  

## 2014-08-29 ENCOUNTER — Other Ambulatory Visit: Payer: Self-pay | Admitting: Cardiothoracic Surgery

## 2014-08-29 DIAGNOSIS — C3492 Malignant neoplasm of unspecified part of left bronchus or lung: Secondary | ICD-10-CM

## 2014-08-30 ENCOUNTER — Ambulatory Visit
Admission: RE | Admit: 2014-08-30 | Discharge: 2014-08-30 | Disposition: A | Discharge status: Home / Self Care | Payer: Medicare Other | Source: Ambulatory Visit | Attending: Cardiothoracic Surgery | Admitting: Cardiothoracic Surgery

## 2014-08-30 ENCOUNTER — Ambulatory Visit (INDEPENDENT_AMBULATORY_CARE_PROVIDER_SITE_OTHER): Payer: Self-pay | Admitting: Cardiothoracic Surgery

## 2014-08-30 VITALS — BP 108/59 | HR 71 | Resp 16 | Ht 74.0 in | Wt 188.0 lb

## 2014-08-30 DIAGNOSIS — C3492 Malignant neoplasm of unspecified part of left bronchus or lung: Secondary | ICD-10-CM

## 2014-08-30 DIAGNOSIS — Z902 Acquired absence of lung [part of]: Secondary | ICD-10-CM

## 2014-08-30 DIAGNOSIS — Z9889 Other specified postprocedural states: Secondary | ICD-10-CM

## 2014-08-30 DIAGNOSIS — C3432 Malignant neoplasm of lower lobe, left bronchus or lung: Secondary | ICD-10-CM

## 2014-08-30 NOTE — Progress Notes (Signed)
PCP is [name], MD Referring Provider is Jacki Cones, MD  Chief Complaint  Patient presents with  . Routine Post Op    3 wk f/u LLLobectomy 08/10/14 with a cxr    [id number] returns for 3 week followup after undergoing left lower lobectomy for stage II a squamous cell carcinoma. He did well following surgery without cardiac or respiratory problems. The patient is seen his oncologist, Dr. Julien Nordmann with planned some postoperative adjunctive chemotherapy. He is back living at home without breathing problems or significant postthoracotomy pain. Surgical incisions are all healing well. He is maintained sinus rhythm.   Past Medical History  Diagnosis Date  . CAD S/P percutaneous coronary angioplasty January 2006    Abnormal Myoview with inferolateral ischemia: --> 80% prox LAD lesion PCI: Taxus DES 3.0 mm x 16 mm-;; Myoview June 2009 no ischemia or infarction with attenuation artifact  . Hypertension   . Dyslipidemia, goal LDL below 70     on simvastatin and tricor  . Osteoarthritis   . BPH (benign prostatic hyperplasia)   . Squamous cell lung cancer : Left lower lobe  08/03/2014    Bronchoscopy showed extrinsic compression of the left lower lobe with endobronchial biopsies were negative. He subsequently had a needle biopsy which diagnosed the mass as squamous cell carcinoma   . GERD (gastroesophageal reflux disease)     Past Surgical History  Procedure Laterality Date  . Nm myoview ltd  07/2004    inferolatera wall ischemia  . Nm myocar perf wall motion  January 06, 2008     treadmill  myoview - tissue attenuation but no ischemia or infarction  . Doppler echocardiography  June 18 ,2009    mild concentric LVH, NORMAL ef, MILDLY  DILATED LEFT ATRIUM  AND MILD SCLEROTIC  AORTIC  VALVE AND OTHERWISE  NORMAL  . Cardiac catheterization  07/2004    80% proximal LAD lesion--PCI, EF 60%  . Percutaneous coronary stent intervention (pci-s)  2/27/ 2006    TAXUS DES 3.0 mm x 16 mm stent  ->  PROX  LAD  . Renal doppler  05/03/2007    rgt and lft renal arteries normal patency; rgt and lft kidneys normal size  . Umbilical hernia repair    . Appendectomy    . Cataract extraction    . Video assisted thoracoscopy (vats)/ lobectomy Left 08/10/2014    Procedure: VIDEO ASSISTED THORACOSCOPY (VATS)/ LOBECTOMY;  Surgeon: Ivin Poot, MD;  Location: [facility];  Service: Thoracic;  Laterality: Left;  . Video bronchoscopy N/A 08/10/2014    Procedure: VIDEO BRONCHOSCOPY;  Surgeon: Ivin Poot, MD;  [facility]: [facility];  Service: Thoracic;  Laterality: N/A;    Family History  Problem Relation Age of Onset  . Cancer Mother   . Heart disease Mother   . Heart disease Father   . Parkinsonism Brother     Social History History  Substance Use Topics  . Smoking status: Former Smoker    Types: Cigarettes    Quit date: 10/25/1982  . Smokeless tobacco: Not on file  . Alcohol Use: No    Current Outpatient Prescriptions  Medication Sig Dispense Refill  . acetaminophen (TYLENOL) 325 MG tablet Take 650 mg by mouth every 6 (six) hours as needed (pain).    Marland Kitchen aspirin EC 325 MG tablet Take 325 mg by mouth daily.    . fenofibrate (TRICOR) 145 MG tablet TAKE 1 TABLET BY MOUTH EVERY DAY (Patient taking differently: Take 145 mg by mouth daily. )  90 tablet 3  . isosorbide mononitrate (IMDUR) 30 MG 24 hr tablet Take 1 tablet (30 mg total) by mouth daily. 90 tablet 3  . lactulose (CHRONULAC) 10 GM/15ML solution Take 30 mLs (20 g total) by mouth daily as needed for mild constipation. 240 mL 1  . metoprolol succinate (TOPROL XL) 25 MG 24 hr tablet Take 1 tablet (25 mg total) by mouth daily. 90 tablet 3  . simvastatin (ZOCOR) 40 MG tablet TAKE 1 TABLET EVERY DAY (Patient taking differently: Take 40 mg by mouth daily at 6 PM. TAKE 1 TABLET EVERY DAY) 90 tablet 3  . traMADol (ULTRAM) 50 MG tablet Take 50 mg by mouth every 6 (six) hours as needed for moderate pain.    . valsartan-hydrochlorothiazide  (DIOVAN-HCT) 160-12.5 MG per tablet TAKE 1 TABLET BY MOUTH EVERY DAY (NEED REFILLS) 90 tablet 3  . prochlorperazine (COMPAZINE) 10 MG tablet Take 1 tablet (10 mg total) by mouth every 6 (six) hours as needed for nausea or vomiting. (Patient not taking: Reported on 08/30/2014) 30 tablet 0   No current facility-administered medications for this visit.    Allergies  Allergen Reactions  . Niaspan [Niacin Er] Other (See Comments)    flushing    Review of Systems  He is a nonsmoker Improved appetite Walking 20-30 minutes daily No fever or night sweats No abdominal pain No rash or skin ulceration  BP 108/59 mmHg  Pulse 71  Resp 16  Ht 6\' 2"  (1.88 m)  Wt 188 lb (85.276 kg)  BMI 24.13 kg/m2  SpO2 96% Physical Exam Alert and comfortable accompanied by his daughter HEENT normocephalic Lungs clear Cardiac regular and without murmur or gallop Abdomen soft nontender Extremities without edema or tenderness Neuro intact  Diagnostic Tests: Chest x-ray clear with postoperative changes from left lower lobectomy  Impression: Doing extremely well following pulmonary section-lobectomy for stage II squamous cell. He will complete postoperative adjunctive chemotherapy planned until may He return in 4-6 weeks for followup  6 weeks  for monitoring of progress  Plan:return with chest x-ray in 6 weeks

## 2014-09-01 ENCOUNTER — Telehealth: Payer: Self-pay | Admitting: *Deleted

## 2014-09-01 NOTE — Telephone Encounter (Signed)
Called and spoke with patient's daughter.  He is staying with her for now.  She stated he is doing well now.  I reminded her of his upcoming appt.  She stated she is aware.  No needs identified at this time.

## 2014-09-06 DIAGNOSIS — Z483 Aftercare following surgery for neoplasm: Secondary | ICD-10-CM

## 2014-09-06 DIAGNOSIS — C3432 Malignant neoplasm of lower lobe, left bronchus or lung: Secondary | ICD-10-CM

## 2014-09-11 ENCOUNTER — Telehealth: Payer: Self-pay | Admitting: Internal Medicine

## 2014-09-11 NOTE — Telephone Encounter (Signed)
returned call adn s.w pt and confirmed appts.Marland KitchenMarland KitchenMarland KitchenMarland Kitchenpt ok and aware

## 2014-09-13 ENCOUNTER — Encounter: Payer: Self-pay | Admitting: *Deleted

## 2014-09-13 ENCOUNTER — Other Ambulatory Visit: Payer: Medicare Other

## 2014-09-19 ENCOUNTER — Other Ambulatory Visit (HOSPITAL_BASED_OUTPATIENT_CLINIC_OR_DEPARTMENT_OTHER): Payer: Medicare Other

## 2014-09-19 ENCOUNTER — Encounter: Payer: Self-pay | Admitting: Internal Medicine

## 2014-09-19 ENCOUNTER — Ambulatory Visit (HOSPITAL_BASED_OUTPATIENT_CLINIC_OR_DEPARTMENT_OTHER): Payer: Medicare Other | Admitting: Internal Medicine

## 2014-09-19 ENCOUNTER — Ambulatory Visit (HOSPITAL_BASED_OUTPATIENT_CLINIC_OR_DEPARTMENT_OTHER): Payer: Medicare Other

## 2014-09-19 ENCOUNTER — Telehealth: Payer: Self-pay | Admitting: Cardiology

## 2014-09-19 VITALS — BP 129/64 | HR 79 | Temp 97.7°F | Resp 18 | Ht 74.0 in | Wt 196.0 lb

## 2014-09-19 DIAGNOSIS — C3432 Malignant neoplasm of lower lobe, left bronchus or lung: Secondary | ICD-10-CM

## 2014-09-19 DIAGNOSIS — C3492 Malignant neoplasm of unspecified part of left bronchus or lung: Secondary | ICD-10-CM

## 2014-09-19 DIAGNOSIS — Z5111 Encounter for antineoplastic chemotherapy: Secondary | ICD-10-CM

## 2014-09-19 LAB — COMPREHENSIVE METABOLIC PANEL (CC13)
ALBUMIN: 3.6 g/dL (ref 3.5–5.0)
ALK PHOS: 69 U/L (ref 40–150)
ALT: 9 U/L (ref 0–55)
AST: 17 U/L (ref 5–34)
Anion Gap: 9 mEq/L (ref 3–11)
BUN: 25.9 mg/dL (ref 7.0–26.0)
CO2: 27 mEq/L (ref 22–29)
Calcium: 9.3 mg/dL (ref 8.4–10.4)
Chloride: 108 mEq/L (ref 98–109)
Creatinine: 1.1 mg/dL (ref 0.7–1.3)
EGFR: 62 mL/min/{1.73_m2} — ABNORMAL LOW (ref >90)
Glucose: 88 mg/dl (ref 70–140)
POTASSIUM: 3.6 meq/L (ref 3.5–5.1)
Sodium: 143 mEq/L (ref 136–145)
Total Bilirubin: 0.46 mg/dL (ref 0.20–1.20)
Total Protein: 6.2 g/dL — ABNORMAL LOW (ref 6.4–8.3)

## 2014-09-19 LAB — CBC WITH DIFFERENTIAL/PLATELET
BASO%: 0.5 % (ref 0.0–2.0)
BASOS ABS: 0 10*3/uL (ref 0.0–0.1)
EOS%: 3.2 % (ref 0.0–7.0)
Eosinophils Absolute: 0.2 10*3/uL (ref 0.0–0.5)
HEMATOCRIT: 41.4 % (ref 38.4–49.9)
HEMOGLOBIN: 13.6 g/dL (ref 13.0–17.1)
LYMPH#: 1.8 10*3/uL (ref 0.9–3.3)
LYMPH%: 27.3 % (ref 14.0–49.0)
MCH: 29.8 pg (ref 27.2–33.4)
MCHC: 32.9 g/dL (ref 32.0–36.0)
MCV: 90.8 fL (ref 79.3–98.0)
MONO#: 0.6 10*3/uL (ref 0.1–0.9)
MONO%: 9.7 % (ref 0.0–14.0)
NEUT%: 59.3 % (ref 39.0–75.0)
NEUTROS ABS: 3.8 10*3/uL (ref 1.5–6.5)
PLATELETS: 188 10*3/uL (ref 140–400)
RBC: 4.56 10*6/uL (ref 4.20–5.82)
RDW: 14.7 % — ABNORMAL HIGH (ref 11.0–14.6)
WBC: 6.5 10*3/uL (ref 4.0–10.3)

## 2014-09-19 MED ORDER — SODIUM CHLORIDE 0.9 % IV SOLN
488.0000 mg | Freq: Once | INTRAVENOUS | Status: AC
  Administered 2014-09-19: 490 mg via INTRAVENOUS
  Filled 2014-09-19: qty 49

## 2014-09-19 MED ORDER — ONDANSETRON 16 MG/50ML IVPB (CHCC)
INTRAVENOUS | Status: AC
Start: 2014-09-19 — End: 2014-09-19
  Filled 2014-09-19: qty 16

## 2014-09-19 MED ORDER — SODIUM CHLORIDE 0.9 % IV SOLN
2000.0000 mg | Freq: Once | INTRAVENOUS | Status: AC
  Administered 2014-09-19: 2000 mg via INTRAVENOUS
  Filled 2014-09-19: qty 52.6

## 2014-09-19 MED ORDER — DEXAMETHASONE SODIUM PHOSPHATE 20 MG/5ML IJ SOLN
INTRAMUSCULAR | Status: AC
  Filled 2014-09-19: qty 5

## 2014-09-19 MED ORDER — ONDANSETRON 16 MG/50ML IVPB (CHCC)
16.0000 mg | Freq: Once | INTRAVENOUS | Status: AC
  Administered 2014-09-19: 16 mg via INTRAVENOUS

## 2014-09-19 MED ORDER — DEXAMETHASONE SODIUM PHOSPHATE 20 MG/5ML IJ SOLN
20.0000 mg | Freq: Once | INTRAMUSCULAR | Status: AC
  Administered 2014-09-19: 20 mg via INTRAVENOUS

## 2014-09-19 MED ORDER — SODIUM CHLORIDE 0.9 % IV SOLN
Freq: Once | INTRAVENOUS | Status: AC
  Administered 2014-09-19: 09:00:00 via INTRAVENOUS

## 2014-09-19 NOTE — Telephone Encounter (Signed)
Returned call to patient he stated his insurance no longer covers Diovan.Message sent to [facility] for a order on a different medication.

## 2014-09-19 NOTE — Telephone Encounter (Signed)
Well we need the insurance Company to tell us what alternatives they have to a Generic med.  I will be happy to change to an equivalent dose - but want the insurance Company's list of meds.  Leonie Man, MD

## 2014-09-19 NOTE — Progress Notes (Signed)
[facility] Telephone:([phone]) [phone]   Fax:([phone]) [phone]  OFFICE PROGRESS NOTE  [name], MD [place] [id number]  DIAGNOSIS: Stage IIA (T2b, N0, M0) non-small cell lung cancer consistent with squamous cell carcinoma diagnosed in January 2016  PRIOR THERAPY: Status post left VATS with left lower lobectomy and mediastinal lymph node dissection under the care of Dr. Prescott Gum on 08/10/2014.  CURRENT THERAPY: Adjuvant systemic chemotherapy with carboplatin for AUC of 5 on day 1 and gemcitabine 1000 MG/M2 on days 1 and 8 every 3 weeks. First dose 09/19/2014.  INTERVAL HISTORY: Jeremy Deleon 81 y.o. male returns to the clinic today for follow-up visit. The patient is feeling fine today was no specific complaints. He denied having any significant fever or chills, no nausea or vomiting. The patient denied having any significant weight loss or night sweats. He has no chest pain, shortness breath, cough or hemoptysis. He is here today to start the first cycle of his adjuvant chemotherapy with carboplatin and gemcitabine.   MEDICAL HISTORY: Past Medical History  Diagnosis Date  . CAD S/P percutaneous coronary angioplasty January 2006    Abnormal Myoview with inferolateral ischemia: --> 80% prox LAD lesion PCI: Taxus DES 3.0 mm x 16 mm-;; Myoview June 2009 no ischemia or infarction with attenuation artifact  . Hypertension   . Dyslipidemia, goal LDL below 70     on simvastatin and tricor  . Osteoarthritis   . BPH (benign prostatic hyperplasia)   . Squamous cell lung cancer : Left lower lobe  08/03/2014    Bronchoscopy showed extrinsic compression of the left lower lobe with endobronchial biopsies were negative. He subsequently had a needle biopsy which diagnosed the mass as squamous cell carcinoma   . GERD (gastroesophageal reflux disease)     ALLERGIES:  is allergic to niaspan.  MEDICATIONS:  Current Outpatient Prescriptions    Medication Sig Dispense Refill  . acetaminophen (TYLENOL) 325 MG tablet Take 650 mg by mouth every 6 (six) hours as needed (pain).    Marland Kitchen aspirin EC 325 MG tablet Take 325 mg by mouth daily.    . fenofibrate (TRICOR) 145 MG tablet TAKE 1 TABLET BY MOUTH EVERY DAY (Patient taking differently: Take 145 mg by mouth daily. ) 90 tablet 3  . isosorbide mononitrate (IMDUR) 30 MG 24 hr tablet Take 1 tablet (30 mg total) by mouth daily. 90 tablet 3  . lactulose (CHRONULAC) 10 GM/15ML solution Take 30 mLs (20 g total) by mouth daily as needed for mild constipation. 240 mL 1  . metoprolol succinate (TOPROL XL) 25 MG 24 hr tablet Take 1 tablet (25 mg total) by mouth daily. 90 tablet 3  . prochlorperazine (COMPAZINE) 10 MG tablet Take 1 tablet (10 mg total) by mouth every 6 (six) hours as needed for nausea or vomiting. (Patient not taking: Reported on 08/30/2014) 30 tablet 0  . simvastatin (ZOCOR) 40 MG tablet TAKE 1 TABLET EVERY DAY (Patient taking differently: Take 40 mg by mouth daily at 6 PM. TAKE 1 TABLET EVERY DAY) 90 tablet 3  . traMADol (ULTRAM) 50 MG tablet Take 50 mg by mouth every 6 (six) hours as needed for moderate pain.    . valsartan-hydrochlorothiazide (DIOVAN-HCT) 160-12.5 MG per tablet TAKE 1 TABLET BY MOUTH EVERY DAY (NEED REFILLS) 90 tablet 3   No current facility-administered medications for this visit.    SURGICAL HISTORY:  Past Surgical History  Procedure Laterality Date  .  Nm myoview ltd  07/2004    inferolatera wall ischemia  . Nm myocar perf wall motion  January 06, 2008     treadmill  myoview - tissue attenuation but no ischemia or infarction  . Doppler echocardiography  June 18 ,2009    mild concentric LVH, NORMAL ef, MILDLY  DILATED LEFT ATRIUM  AND MILD SCLEROTIC  AORTIC  VALVE AND OTHERWISE  NORMAL  . Cardiac catheterization  07/2004    80% proximal LAD lesion--PCI, EF 60%  . Percutaneous coronary stent intervention (pci-s)  2/27/ 2006    TAXUS DES 3.0 mm x 16 mm stent ->  PROX   LAD  . Renal doppler  05/03/2007    rgt and lft renal arteries normal patency; rgt and lft kidneys normal size  . Umbilical hernia repair    . Appendectomy    . Cataract extraction    . Video assisted thoracoscopy (vats)/ lobectomy Left 08/10/2014    Procedure: VIDEO ASSISTED THORACOSCOPY (VATS)/ LOBECTOMY;  Surgeon: Ivin Poot, MD;  Location: [facility];  Service: Thoracic;  Laterality: Left;  . Video bronchoscopy N/A 08/10/2014    Procedure: VIDEO BRONCHOSCOPY;  Surgeon: Ivin Poot, MD;  Location: [facility] OR;  Service: Thoracic;  Laterality: N/A;    REVIEW OF SYSTEMS:  A comprehensive review of systems was negative.   PHYSICAL EXAMINATION: General appearance: alert, cooperative and no distress Head: Normocephalic, without obvious abnormality, atraumatic Neck: no adenopathy, no JVD, supple, symmetrical, trachea midline and thyroid not enlarged, symmetric, no tenderness/mass/nodules Lymph nodes: Cervical, supraclavicular, and axillary nodes normal. Resp: clear to auscultation bilaterally Back: symmetric, no curvature. ROM normal. No CVA tenderness. Cardio: normal apical impulse GI: soft, non-tender; bowel sounds normal; no masses,  no organomegaly Extremities: extremities normal, atraumatic, no cyanosis or edema  ECOG PERFORMANCE STATUS: 1 - Symptomatic but completely ambulatory  Blood pressure 129/64, pulse 79, temperature 97.7 F (36.5 C), temperature source Oral, resp. rate 18, height 6\' 2"  (1.88 m), weight 196 lb (88.905 kg).  LABORATORY DATA: Lab Results  Component Value Date   WBC 6.5 09/19/2014   HGB 13.6 09/19/2014   HCT 41.4 09/19/2014   MCV 90.8 09/19/2014   PLT 188 09/19/2014      Chemistry      Component Value Date/Time   NA 140 08/14/2014 0255   K 3.6 08/14/2014 0255   CL 105 08/14/2014 0255   CO2 29 08/14/2014 0255   BUN 19 08/14/2014 0255   CREATININE 0.96 08/14/2014 0255      Component Value Date/Time   CALCIUM 8.6 08/14/2014 0255   ALKPHOS 49  08/14/2014 0255   AST 20 08/14/2014 0255   ALT 11 08/14/2014 0255   BILITOT 0.9 08/14/2014 0255       RADIOGRAPHIC STUDIES: Dg Chest 2 View  08/30/2014   CLINICAL DATA:  Lung cancer.  Left lung surgery.  EXAM: CHEST  2 VIEW  COMPARISON:  08/15/2014.  FINDINGS: Mediastinum hilar structures normal. Left lower lobectomy. Small left pleural effusion. Calcified pulmonary nodules right lung consistent granulomas. Very tiny residual left apical pneumothorax, decreased from prior exam. Heart size stable. Pulmonary vascularity normal. Degenerative changes thoracic spine.  IMPRESSION: 1. Left lower lobectomy. Small left pleural effusion. Tiny residual left apical pneumothorax, diminished from prior exam. 2. Calcified pulmonary granulomas right lung.   Electronically Signed   By: Marcello Moores  Register   On: 08/30/2014 10:57    ASSESSMENT AND PLAN: This is a very pleasant 81 years old white male recently diagnosed with  a stage IIa non-small cell lung cancer, squamous cell carcinoma status post left lower lobectomy with lymph node dissection. The patient is here today to start the first cycle of adjuvant chemotherapy with carboplatin and gemcitabine. He is feeling fine with no significant complaints. We'll proceed with the first cycle today as scheduled. The patient would come back for follow-up visit in 3 weeks with the start of cycle #2. He was advised to call immediately if he has any concerning symptoms in the interval. The patient voices understanding of current disease status and treatment options and is in agreement with the current care plan.  All questions were answered. The patient knows to call the clinic with any problems, questions or concerns. We can certainly see the patient much sooner if necessary.  Disclaimer: This note was dictated with voice recognition software. Similar sounding words can inadvertently be transcribed and may not be corrected upon review.

## 2014-09-19 NOTE — Telephone Encounter (Signed)
Pt called in stating that his insurance will no longer covered his Diovan and would like to find another option. Please f/u  Thanks

## 2014-09-19 NOTE — Patient Instructions (Signed)
[facility] Discharge Instructions for Patients Receiving Chemotherapy  Today you received the following chemotherapy agents: Gemzar, Carboplatin  To help prevent nausea and vomiting after your treatment, we encourage you to take your nausea medication as prescribed by your physician: Compazine 10 mg every 6 hrs as needed for nausea. You may take one dose tonight prior to bedtime regardless if nausea is present or not.   If you develop nausea and vomiting that is not controlled by your nausea medication, call the clinic.   BELOW ARE SYMPTOMS THAT SHOULD BE REPORTED IMMEDIATELY:  *FEVER GREATER THAN 100.5 F  *CHILLS WITH OR WITHOUT FEVER  NAUSEA AND VOMITING THAT IS NOT CONTROLLED WITH YOUR NAUSEA MEDICATION  *UNUSUAL SHORTNESS OF BREATH  *UNUSUAL BRUISING OR BLEEDING  TENDERNESS IN MOUTH AND THROAT WITH OR WITHOUT PRESENCE OF ULCERS  *URINARY PROBLEMS  *BOWEL PROBLEMS  UNUSUAL RASH Items with * indicate a potential emergency and should be followed up as soon as possible.  Feel free to call the clinic you have any questions or concerns. The clinic phone number is [phone].  Gemcitabine injection (Gemzar) What is this medicine? GEMCITABINE (jem SIT a been) is a chemotherapy drug. This medicine is used to treat many types of cancer like breast cancer, lung cancer, pancreatic cancer, and ovarian cancer. This medicine may be used for other purposes; ask your health care provider or pharmacist if you have questions. COMMON BRAND NAME(S): Gemzar What should I tell my health care provider before I take this medicine? They need to know if you have any of these conditions: -blood disorders -infection -kidney disease -liver disease -recent or ongoing radiation therapy -an unusual or allergic reaction to gemcitabine, other chemotherapy, other medicines, foods, dyes, or preservatives -pregnant or trying to get pregnant -breast-feeding How should I use this  medicine? This drug is given as an infusion into a vein. It is administered in a hospital or clinic by a specially trained health care professional. Talk to your pediatrician regarding the use of this medicine in children. Special care may be needed. Overdosage: If you think you have taken too much of this medicine contact a poison control center or emergency room at once. NOTE: This medicine is only for you. Do not share this medicine with others. What if I miss a dose? It is important not to miss your dose. Call your doctor or health care professional if you are unable to keep an appointment. What may interact with this medicine? -medicines to increase blood counts like filgrastim, pegfilgrastim, sargramostim -some other chemotherapy drugs like cisplatin -vaccines Talk to your doctor or health care professional before taking any of these medicines: -acetaminophen -aspirin -ibuprofen -ketoprofen -naproxen This list may not describe all possible interactions. Give your health care provider a list of all the medicines, herbs, non-prescription drugs, or dietary supplements you use. Also tell them if you smoke, drink alcohol, or use illegal drugs. Some items may interact with your medicine. What should I watch for while using this medicine? Visit your doctor for checks on your progress. This drug may make you feel generally unwell. This is not uncommon, as chemotherapy can affect healthy cells as well as cancer cells. Report any side effects. Continue your course of treatment even though you feel ill unless your doctor tells you to stop. In some cases, you may be given additional medicines to help with side effects. Follow all directions for their use. Call your doctor or health care professional for advice if you  get a fever, chills or sore throat, or other symptoms of a cold or flu. Do not treat yourself. This drug decreases your body's ability to fight infections. Try to avoid being around  people who are sick. This medicine may increase your risk to bruise or bleed. Call your doctor or health care professional if you notice any unusual bleeding. Be careful brushing and flossing your teeth or using a toothpick because you may get an infection or bleed more easily. If you have any dental work done, tell your dentist you are receiving this medicine. Avoid taking products that contain aspirin, acetaminophen, ibuprofen, naproxen, or ketoprofen unless instructed by your doctor. These medicines may hide a fever. Women should inform their doctor if they wish to become pregnant or think they might be pregnant. There is a potential for serious side effects to an unborn child. Talk to your health care professional or pharmacist for more information. Do not breast-feed an infant while taking this medicine. What side effects may I notice from receiving this medicine? Side effects that you should report to your doctor or health care professional as soon as possible: -allergic reactions like skin rash, itching or hives, swelling of the face, lips, or tongue -low blood counts - this medicine may decrease the number of white blood cells, red blood cells and platelets. You may be at increased risk for infections and bleeding. -signs of infection - fever or chills, cough, sore throat, pain or difficulty passing urine -signs of decreased platelets or bleeding - bruising, pinpoint red spots on the skin, black, tarry stools, blood in the urine -signs of decreased red blood cells - unusually weak or tired, fainting spells, lightheadedness -breathing problems -chest pain -mouth sores -nausea and vomiting -pain, swelling, redness at site where injected -pain, tingling, numbness in the hands or feet -stomach pain -swelling of ankles, feet, hands -unusual bleeding Side effects that usually do not require medical attention (report to your doctor or health care professional if they continue or are  bothersome): -constipation -diarrhea -hair loss -loss of appetite -stomach upset This list may not describe all possible side effects. Call your doctor for medical advice about side effects. You may report side effects to FDA at 1-800-FDA-1088. Where should I keep my medicine? This drug is given in a hospital or clinic and will not be stored at home. NOTE: This sheet is a summary. It may not cover all possible information. If you have questions about this medicine, talk to your doctor, pharmacist, or health care provider.  2015, Elsevier/Gold Standard. (2007-11-16 18:45:54)  Carboplatin injection What is this medicine? CARBOPLATIN (KAR boe pla tin) is a chemotherapy drug. It targets fast dividing cells, like cancer cells, and causes these cells to die. This medicine is used to treat ovarian cancer and many other cancers. This medicine may be used for other purposes; ask your health care provider or pharmacist if you have questions. COMMON BRAND NAME(S): Paraplatin What should I tell my health care provider before I take this medicine? They need to know if you have any of these conditions: -blood disorders -hearing problems -kidney disease -recent or ongoing radiation therapy -an unusual or allergic reaction to carboplatin, cisplatin, other chemotherapy, other medicines, foods, dyes, or preservatives -pregnant or trying to get pregnant -breast-feeding How should I use this medicine? This drug is usually given as an infusion into a vein. It is administered in a hospital or clinic by a specially trained health care professional. Talk to your pediatrician regarding the  use of this medicine in children. Special care may be needed. Overdosage: If you think you have taken too much of this medicine contact a poison control center or emergency room at once. NOTE: This medicine is only for you. Do not share this medicine with others. What if I miss a dose? It is important not to miss a dose.  Call your doctor or health care professional if you are unable to keep an appointment. What may interact with this medicine? -medicines for seizures -medicines to increase blood counts like filgrastim, pegfilgrastim, sargramostim -some antibiotics like amikacin, gentamicin, neomycin, streptomycin, tobramycin -vaccines Talk to your doctor or health care professional before taking any of these medicines: -acetaminophen -aspirin -ibuprofen -ketoprofen -naproxen This list may not describe all possible interactions. Give your health care provider a list of all the medicines, herbs, non-prescription drugs, or dietary supplements you use. Also tell them if you smoke, drink alcohol, or use illegal drugs. Some items may interact with your medicine. What should I watch for while using this medicine? Your condition will be monitored carefully while you are receiving this medicine. You will need important blood work done while you are taking this medicine. This drug may make you feel generally unwell. This is not uncommon, as chemotherapy can affect healthy cells as well as cancer cells. Report any side effects. Continue your course of treatment even though you feel ill unless your doctor tells you to stop. In some cases, you may be given additional medicines to help with side effects. Follow all directions for their use. Call your doctor or health care professional for advice if you get a fever, chills or sore throat, or other symptoms of a cold or flu. Do not treat yourself. This drug decreases your body's ability to fight infections. Try to avoid being around people who are sick. This medicine may increase your risk to bruise or bleed. Call your doctor or health care professional if you notice any unusual bleeding. Be careful brushing and flossing your teeth or using a toothpick because you may get an infection or bleed more easily. If you have any dental work done, tell your dentist you are receiving this  medicine. Avoid taking products that contain aspirin, acetaminophen, ibuprofen, naproxen, or ketoprofen unless instructed by your doctor. These medicines may hide a fever. Do not become pregnant while taking this medicine. Women should inform their doctor if they wish to become pregnant or think they might be pregnant. There is a potential for serious side effects to an unborn child. Talk to your health care professional or pharmacist for more information. Do not breast-feed an infant while taking this medicine. What side effects may I notice from receiving this medicine? Side effects that you should report to your doctor or health care professional as soon as possible: -allergic reactions like skin rash, itching or hives, swelling of the face, lips, or tongue -signs of infection - fever or chills, cough, sore throat, pain or difficulty passing urine -signs of decreased platelets or bleeding - bruising, pinpoint red spots on the skin, black, tarry stools, nosebleeds -signs of decreased red blood cells - unusually weak or tired, fainting spells, lightheadedness -breathing problems -changes in hearing -changes in vision -chest pain -high blood pressure -low blood counts - This drug may decrease the number of white blood cells, red blood cells and platelets. You may be at increased risk for infections and bleeding. -nausea and vomiting -pain, swelling, redness or irritation at the injection site -pain, tingling, numbness  in the hands or feet -problems with balance, talking, walking -trouble passing urine or change in the amount of urine Side effects that usually do not require medical attention (report to your doctor or health care professional if they continue or are bothersome): -hair loss -loss of appetite -metallic taste in the mouth or changes in taste This list may not describe all possible side effects. Call your doctor for medical advice about side effects. You may report side effects to  FDA at 1-800-FDA-1088. Where should I keep my medicine? This drug is given in a hospital or clinic and will not be stored at home. NOTE: This sheet is a summary. It may not cover all possible information. If you have questions about this medicine, talk to your doctor, pharmacist, or health care provider.  2015, Elsevier/Gold Standard. (2007-10-12 14:38:05)

## 2014-09-20 ENCOUNTER — Telehealth: Payer: Self-pay | Admitting: Medical Oncology

## 2014-09-20 NOTE — Telephone Encounter (Signed)
Returned call to patient Dr.Harding advised to find out alternative medications instead of Diovan.Advised to call back then he will make a decision.

## 2014-09-20 NOTE — Telephone Encounter (Signed)
Left message for pt to call back for chemo f/u

## 2014-09-21 ENCOUNTER — Telehealth: Payer: Self-pay | Admitting: *Deleted

## 2014-09-21 NOTE — Telephone Encounter (Signed)
Patient came by office with a list of medication on formulary to replace diovan -hctz 160/12.5 mg  1- telmisartan-hctz .80/12.5 mg 2-irbesartan-htcz  300/12.5mg  3- losartan-hctz 100/12.5 mg   Will  Defer to Dr Ellyn Hack and contact patient once decision is made,

## 2014-09-25 NOTE — Telephone Encounter (Signed)
Lets actually start with: Telmisartan 40 /12.5 mg.    His last clinic visit BP was 112/78 --> not sure how the doses truly correlate & do not want to overshoot.  [place] [facility]

## 2014-09-26 ENCOUNTER — Ambulatory Visit (HOSPITAL_BASED_OUTPATIENT_CLINIC_OR_DEPARTMENT_OTHER): Payer: Medicare Other

## 2014-09-26 ENCOUNTER — Other Ambulatory Visit (HOSPITAL_BASED_OUTPATIENT_CLINIC_OR_DEPARTMENT_OTHER): Payer: Medicare Other

## 2014-09-26 DIAGNOSIS — C3492 Malignant neoplasm of unspecified part of left bronchus or lung: Secondary | ICD-10-CM

## 2014-09-26 DIAGNOSIS — Z5111 Encounter for antineoplastic chemotherapy: Secondary | ICD-10-CM

## 2014-09-26 DIAGNOSIS — C3432 Malignant neoplasm of lower lobe, left bronchus or lung: Secondary | ICD-10-CM

## 2014-09-26 LAB — CBC WITH DIFFERENTIAL/PLATELET
BASO%: 0.3 % (ref 0.0–2.0)
BASOS ABS: 0 10*3/uL (ref 0.0–0.1)
EOS ABS: 0.1 10*3/uL (ref 0.0–0.5)
EOS%: 3.2 % (ref 0.0–7.0)
HCT: 38.4 % (ref 38.4–49.9)
HGB: 12.6 g/dL — ABNORMAL LOW (ref 13.0–17.1)
LYMPH#: 1.9 10*3/uL (ref 0.9–3.3)
LYMPH%: 60.6 % — ABNORMAL HIGH (ref 14.0–49.0)
MCH: 29.8 pg (ref 27.2–33.4)
MCHC: 32.8 g/dL (ref 32.0–36.0)
MCV: 90.8 fL (ref 79.3–98.0)
MONO#: 0.1 10*3/uL (ref 0.1–0.9)
MONO%: 4.5 % (ref 0.0–14.0)
NEUT%: 31.4 % — ABNORMAL LOW (ref 39.0–75.0)
NEUTROS ABS: 1 10*3/uL — AB (ref 1.5–6.5)
Platelets: 139 10*3/uL — ABNORMAL LOW (ref 140–400)
RBC: 4.23 10*6/uL (ref 4.20–5.82)
RDW: 14.1 % (ref 11.0–14.6)
WBC: 3.1 10*3/uL — ABNORMAL LOW (ref 4.0–10.3)

## 2014-09-26 LAB — COMPREHENSIVE METABOLIC PANEL (CC13)
ALBUMIN: 3.5 g/dL (ref 3.5–5.0)
ALT: 20 U/L (ref 0–55)
AST: 34 U/L (ref 5–34)
Alkaline Phosphatase: 79 U/L (ref 40–150)
Anion Gap: 9 mEq/L (ref 3–11)
BUN: 22.7 mg/dL (ref 7.0–26.0)
CHLORIDE: 105 meq/L (ref 98–109)
CO2: 27 mEq/L (ref 22–29)
Calcium: 8.9 mg/dL (ref 8.4–10.4)
Creatinine: 0.9 mg/dL (ref 0.7–1.3)
EGFR: 76 mL/min/{1.73_m2} — AB (ref >90)
GLUCOSE: 75 mg/dL (ref 70–140)
Potassium: 3.7 mEq/L (ref 3.5–5.1)
Sodium: 141 mEq/L (ref 136–145)
TOTAL PROTEIN: 6.2 g/dL — AB (ref 6.4–8.3)
Total Bilirubin: 0.38 mg/dL (ref 0.20–1.20)

## 2014-09-26 MED ORDER — SODIUM CHLORIDE 0.9 % IV SOLN
2000.0000 mg | Freq: Once | INTRAVENOUS | Status: AC
  Administered 2014-09-26: 2000 mg via INTRAVENOUS
  Filled 2014-09-26: qty 52.6

## 2014-09-26 MED ORDER — TELMISARTAN-HCTZ 40-12.5 MG PO TABS: 1.0000 | ORAL_TABLET | Freq: Every day | ORAL | Status: DC

## 2014-09-26 MED ORDER — PROCHLORPERAZINE MALEATE 10 MG PO TABS
10.0000 mg | ORAL_TABLET | Freq: Once | ORAL | Status: AC
  Administered 2014-09-26: 10 mg via ORAL

## 2014-09-26 MED ORDER — PROCHLORPERAZINE MALEATE 10 MG PO TABS
ORAL_TABLET | ORAL | Status: AC
  Filled 2014-09-26: qty 1

## 2014-09-26 MED ORDER — SODIUM CHLORIDE 0.9 % IV SOLN
Freq: Once | INTRAVENOUS | Status: AC
  Administered 2014-09-26: 11:00:00 via INTRAVENOUS

## 2014-09-26 NOTE — Progress Notes (Signed)
Ok to treat with Gemzar day 8 cycle 1 with ANC of 1.0 per Dr Julien Nordmann.

## 2014-09-26 NOTE — Telephone Encounter (Signed)
Spoke to patient . Aware e-sent medication- #90 day supply x 3 refill

## 2014-09-26 NOTE — Patient Instructions (Signed)
[facility] Discharge Instructions for Patients Receiving Chemotherapy  Today you received the following chemotherapy agents Gemzar.  To help prevent nausea and vomiting after your treatment, we encourage you to take your nausea medication.   If you develop nausea and vomiting that is not controlled by your nausea medication, call the clinic.   BELOW ARE SYMPTOMS THAT SHOULD BE REPORTED IMMEDIATELY:  *FEVER GREATER THAN 100.5 F  *CHILLS WITH OR WITHOUT FEVER  NAUSEA AND VOMITING THAT IS NOT CONTROLLED WITH YOUR NAUSEA MEDICATION  *UNUSUAL SHORTNESS OF BREATH  *UNUSUAL BRUISING OR BLEEDING  TENDERNESS IN MOUTH AND THROAT WITH OR WITHOUT PRESENCE OF ULCERS  *URINARY PROBLEMS  *BOWEL PROBLEMS  UNUSUAL RASH Items with * indicate a potential emergency and should be followed up as soon as possible.  Feel free to call the clinic you have any questions or concerns. The clinic phone number is [phone].

## 2014-10-09 ENCOUNTER — Other Ambulatory Visit: Payer: Self-pay | Admitting: Cardiothoracic Surgery

## 2014-10-09 DIAGNOSIS — Z902 Acquired absence of lung [part of]: Secondary | ICD-10-CM

## 2014-10-10 ENCOUNTER — Ambulatory Visit (HOSPITAL_BASED_OUTPATIENT_CLINIC_OR_DEPARTMENT_OTHER): Payer: Medicare Other

## 2014-10-10 ENCOUNTER — Ambulatory Visit (HOSPITAL_BASED_OUTPATIENT_CLINIC_OR_DEPARTMENT_OTHER): Payer: Medicare Other | Admitting: Internal Medicine

## 2014-10-10 ENCOUNTER — Other Ambulatory Visit (HOSPITAL_BASED_OUTPATIENT_CLINIC_OR_DEPARTMENT_OTHER): Payer: Medicare Other

## 2014-10-10 ENCOUNTER — Encounter: Payer: Self-pay | Admitting: Internal Medicine

## 2014-10-10 ENCOUNTER — Telehealth: Payer: Self-pay | Admitting: Internal Medicine

## 2014-10-10 VITALS — BP 113/58 | HR 66 | Temp 97.5°F | Resp 18 | Ht 74.0 in | Wt 197.1 lb

## 2014-10-10 DIAGNOSIS — C3492 Malignant neoplasm of unspecified part of left bronchus or lung: Secondary | ICD-10-CM

## 2014-10-10 DIAGNOSIS — Z5111 Encounter for antineoplastic chemotherapy: Secondary | ICD-10-CM | POA: Diagnosis not present

## 2014-10-10 DIAGNOSIS — C3432 Malignant neoplasm of lower lobe, left bronchus or lung: Secondary | ICD-10-CM | POA: Diagnosis not present

## 2014-10-10 LAB — CBC WITH DIFFERENTIAL/PLATELET
BASO%: 0.6 % (ref 0.0–2.0)
BASOS ABS: 0 10*3/uL (ref 0.0–0.1)
EOS ABS: 0.1 10*3/uL (ref 0.0–0.5)
EOS%: 1.8 % (ref 0.0–7.0)
HCT: 35.6 % — ABNORMAL LOW (ref 38.4–49.9)
HEMOGLOBIN: 11.5 g/dL — AB (ref 13.0–17.1)
LYMPH#: 1.5 10*3/uL (ref 0.9–3.3)
LYMPH%: 50.8 % — ABNORMAL HIGH (ref 14.0–49.0)
MCH: 29.3 pg (ref 27.2–33.4)
MCHC: 32.4 g/dL (ref 32.0–36.0)
MCV: 90.6 fL (ref 79.3–98.0)
MONO#: 0.5 10*3/uL (ref 0.1–0.9)
MONO%: 15 % — ABNORMAL HIGH (ref 0.0–14.0)
NEUT#: 1 10*3/uL — ABNORMAL LOW (ref 1.5–6.5)
NEUT%: 31.8 % — ABNORMAL LOW (ref 39.0–75.0)
Platelets: 356 10*3/uL (ref 140–400)
RBC: 3.93 10*6/uL — ABNORMAL LOW (ref 4.20–5.82)
RDW: 13.6 % (ref 11.0–14.6)
WBC: 3.1 10*3/uL — ABNORMAL LOW (ref 4.0–10.3)

## 2014-10-10 LAB — COMPREHENSIVE METABOLIC PANEL (CC13)
ALT: 33 U/L (ref 0–55)
ANION GAP: 11 meq/L (ref 3–11)
AST: 32 U/L (ref 5–34)
Albumin: 3.7 g/dL (ref 3.5–5.0)
Alkaline Phosphatase: 73 U/L (ref 40–150)
BILIRUBIN TOTAL: 0.28 mg/dL (ref 0.20–1.20)
BUN: 14.6 mg/dL (ref 7.0–26.0)
CO2: 27 meq/L (ref 22–29)
Calcium: 9 mg/dL (ref 8.4–10.4)
Chloride: 104 mEq/L (ref 98–109)
Creatinine: 1.2 mg/dL (ref 0.7–1.3)
EGFR: 60 mL/min/{1.73_m2} — AB (ref >90)
GLUCOSE: 90 mg/dL (ref 70–140)
Potassium: 3.6 mEq/L (ref 3.5–5.1)
Sodium: 142 mEq/L (ref 136–145)
Total Protein: 6.4 g/dL (ref 6.4–8.3)

## 2014-10-10 MED ORDER — SODIUM CHLORIDE 0.9 % IV SOLN
800.0000 mg/m2 | Freq: Once | INTRAVENOUS | Status: AC
  Administered 2014-10-10: 1710 mg via INTRAVENOUS
  Filled 2014-10-10: qty 44.97

## 2014-10-10 MED ORDER — SODIUM CHLORIDE 0.9 % IV SOLN
Freq: Once | INTRAVENOUS | Status: AC
  Administered 2014-10-10: 11:00:00 via INTRAVENOUS
  Filled 2014-10-10: qty 8

## 2014-10-10 MED ORDER — CARBOPLATIN CHEMO INJECTION 450 MG/45ML
342.0000 mg | Freq: Once | INTRAVENOUS | Status: AC
  Administered 2014-10-10: 340 mg via INTRAVENOUS
  Filled 2014-10-10: qty 34

## 2014-10-10 MED ORDER — SODIUM CHLORIDE 0.9 % IV SOLN
Freq: Once | INTRAVENOUS | Status: AC
  Administered 2014-10-10: 11:00:00 via INTRAVENOUS

## 2014-10-10 NOTE — Telephone Encounter (Signed)
added appt pt will get sched in tx. °

## 2014-10-10 NOTE — Progress Notes (Signed)
OK to treat despite ANC of 1.0 per Dr Earlie Server.  Repeated order & verified by Jesse Fall RN

## 2014-10-10 NOTE — Patient Instructions (Signed)
[facility] Discharge Instructions for Patients Receiving Chemotherapy  Today you received the following chemotherapy agents :  Gemzar/Carboplatin  To help prevent nausea and vomiting after your treatment, we encourage you to take your nausea medicationIf you develop nausea and vomiting that is not controlled by your nausea medication, call the clinic.   BELOW ARE SYMPTOMS THAT SHOULD BE REPORTED IMMEDIATELY:  *FEVER GREATER THAN 100.5 F  *CHILLS WITH OR WITHOUT FEVER  NAUSEA AND VOMITING THAT IS NOT CONTROLLED WITH YOUR NAUSEA MEDICATION  *UNUSUAL SHORTNESS OF BREATH  *UNUSUAL BRUISING OR BLEEDING  TENDERNESS IN MOUTH AND THROAT WITH OR WITHOUT PRESENCE OF ULCERS  *URINARY PROBLEMS  *BOWEL PROBLEMS  UNUSUAL RASH Items with * indicate a potential emergency and should be followed up as soon as possible.  Feel free to call the clinic you have any questions or concerns. The clinic phone number is [phone].  Please show the [facility] at check-in to the Emergency Department and triage nurse.

## 2014-10-10 NOTE — Progress Notes (Signed)
[facility] [phone]   [phone]  OFFICE PROGRESS NOTE  [name], MD [place] [address]  DIAGNOSIS: Stage IIA (T2b, N0, M0) non-small cell lung cancer consistent with squamous cell carcinoma diagnosed in January 2016  PRIOR THERAPY: Status post left VATS with left lower lobectomy and mediastinal lymph node dissection under the care of Dr. Prescott Gum on 08/10/2014.  CURRENT THERAPY: Adjuvant systemic chemotherapy with carboplatin for AUC of 5 on day 1 and gemcitabine 1000 MG/M2 on days 1 and 8 every 3 weeks. First dose 09/19/2014.  INTERVAL HISTORY: Jeremy Deleon 81 y.o. male returns to the clinic today for follow-up visit. The patient is feeling fine today with no specific complaints. He tolerated the first cycle of his treatment fairly well. He denied having any significant fever or chills, no nausea or vomiting. The patient denied having any significant weight loss or night sweats. He has no chest pain, shortness of breath, cough or hemoptysis. He is today to start cycle #2 of his chemotherapy.  MEDICAL HISTORY: Past Medical History  Diagnosis Date  . CAD S/P percutaneous coronary angioplasty January 2006    Abnormal Myoview with inferolateral ischemia: --> 80% prox LAD lesion PCI: Taxus DES 3.0 mm x 16 mm-;; Myoview June 2009 no ischemia or infarction with attenuation artifact  . Hypertension   . Dyslipidemia, goal LDL below 70     on simvastatin and tricor  . Osteoarthritis   . BPH (benign prostatic hyperplasia)   . Squamous cell lung cancer : Left lower lobe  08/03/2014    Bronchoscopy showed extrinsic compression of the left lower lobe with endobronchial biopsies were negative. He subsequently had a needle biopsy which diagnosed the mass as squamous cell carcinoma   . GERD (gastroesophageal reflux disease)     ALLERGIES:  is allergic to niaspan.  MEDICATIONS:  Current Outpatient  Prescriptions  Medication Sig Dispense Refill  . acetaminophen (TYLENOL) 325 MG tablet Take 650 mg by mouth every 6 (six) hours as needed (pain).    Marland Kitchen aspirin EC 325 MG tablet Take 325 mg by mouth daily.    . fenofibrate (TRICOR) 145 MG tablet TAKE 1 TABLET BY MOUTH EVERY DAY (Patient taking differently: Take 145 mg by mouth daily. ) 90 tablet 3  . isosorbide mononitrate (IMDUR) 30 MG 24 hr tablet Take 1 tablet (30 mg total) by mouth daily. 90 tablet 3  . metoprolol succinate (TOPROL XL) 25 MG 24 hr tablet Take 1 tablet (25 mg total) by mouth daily. 90 tablet 3  . simvastatin (ZOCOR) 40 MG tablet TAKE 1 TABLET EVERY DAY (Patient taking differently: Take 40 mg by mouth daily at 6 PM. TAKE 1 TABLET EVERY DAY) 90 tablet 3  . telmisartan-hydrochlorothiazide (MICARDIS HCT) 40-12.5 MG per tablet Take 1 tablet by mouth daily. 90 tablet 3  . traMADol (ULTRAM) 50 MG tablet Take 50 mg by mouth every 6 (six) hours as needed for moderate pain.    Marland Kitchen lactulose (CHRONULAC) 10 GM/15ML solution Take 30 mLs (20 g total) by mouth daily as needed for mild constipation. (Patient not taking: Reported on 09/19/2014) 240 mL 1  . prochlorperazine (COMPAZINE) 10 MG tablet Take 1 tablet (10 mg total) by mouth every 6 (six) hours as needed for nausea or vomiting. (Patient not taking: Reported on 08/30/2014) 30 tablet 0   No current facility-administered medications for this visit.    SURGICAL HISTORY:  Past Surgical History  Procedure Laterality Date  . Nm myoview ltd  07/2004    inferolatera wall ischemia  . Nm myocar perf wall motion  January 06, 2008     treadmill  myoview - tissue attenuation but no ischemia or infarction  . Doppler echocardiography  June 18 ,2009    mild concentric LVH, NORMAL ef, MILDLY  DILATED LEFT ATRIUM  AND MILD SCLEROTIC  AORTIC  VALVE AND OTHERWISE  NORMAL  . Cardiac catheterization  07/2004    80% proximal LAD lesion--PCI, EF 60%  . Percutaneous coronary stent intervention (pci-s)  2/27/ 2006      TAXUS DES 3.0 mm x 16 mm stent ->  PROX  LAD  . Renal doppler  05/03/2007    rgt and lft renal arteries normal patency; rgt and lft kidneys normal size  . Umbilical hernia repair    . Appendectomy    . Cataract extraction    . Video assisted thoracoscopy (vats)/ lobectomy Left 08/10/2014    Procedure: VIDEO ASSISTED THORACOSCOPY (VATS)/ LOBECTOMY;  Surgeon: Ivin Poot, MD;  Location: [facility];  Service: Thoracic;  Laterality: Left;  . Video bronchoscopy N/A 08/10/2014    Procedure: VIDEO BRONCHOSCOPY;  Surgeon: Ivin Poot, MD;  Location: [facility] OR;  Service: Thoracic;  Laterality: N/A;    REVIEW OF SYSTEMS:  A comprehensive review of systems was negative.   PHYSICAL EXAMINATION: General appearance: alert, cooperative and no distress Head: Normocephalic, without obvious abnormality, atraumatic Neck: no adenopathy, no JVD, supple, symmetrical, trachea midline and thyroid not enlarged, symmetric, no tenderness/mass/nodules Lymph nodes: Cervical, supraclavicular, and axillary nodes normal. Resp: clear to auscultation bilaterally Back: symmetric, no curvature. ROM normal. No CVA tenderness. Cardio: normal apical impulse GI: soft, non-tender; bowel sounds normal; no masses,  no organomegaly Extremities: extremities normal, atraumatic, no cyanosis or edema  ECOG PERFORMANCE STATUS: 1 - Symptomatic but completely ambulatory  Blood pressure 113/58, pulse 66, temperature 97.5 F (36.4 C), temperature source Oral, resp. rate 18, height 6\' 2"  (1.88 m), weight 197 lb 1.6 oz (89.404 kg), SpO2 100 %.  LABORATORY DATA: Lab Results  Component Value Date   WBC 3.1* 10/10/2014   HGB 11.5* 10/10/2014   HCT 35.6* 10/10/2014   MCV 90.6 10/10/2014   PLT 356 10/10/2014      Chemistry      Component Value Date/Time   NA 142 10/10/2014 0951   NA 140 08/14/2014 0255   K 3.6 10/10/2014 0951   K 3.6 08/14/2014 0255   CL 105 08/14/2014 0255   CO2 27 10/10/2014 0951   CO2 29 08/14/2014 0255    BUN 14.6 10/10/2014 0951   BUN 19 08/14/2014 0255   CREATININE 1.2 10/10/2014 0951   CREATININE 0.96 08/14/2014 0255      Component Value Date/Time   CALCIUM 9.0 10/10/2014 0951   CALCIUM 8.6 08/14/2014 0255   ALKPHOS 73 10/10/2014 0951   ALKPHOS 49 08/14/2014 0255   AST 32 10/10/2014 0951   AST 20 08/14/2014 0255   ALT 33 10/10/2014 0951   ALT 11 08/14/2014 0255   BILITOT 0.28 10/10/2014 0951   BILITOT 0.9 08/14/2014 0255       RADIOGRAPHIC STUDIES: No results found.  ASSESSMENT AND PLAN: This is a very pleasant 81 years old white male recently diagnosed with a stage IIa non-small cell lung cancer, squamous cell carcinoma status post left lower lobectomy with lymph node dissection. He is currently undergoing adjuvant chemotherapy with carboplatin and gemcitabine status post 1 cycle. He tolerated  the first cycle of his treatment fairly well. We will proceed with cycle #2 today as a scheduled but I will reduce the dose of carboplatin to AUC of 4 on day 1 and gemcitabine to 800 MG/M2 on days 1 and 8 every 3 week secondary to neutropenia after the first cycle of his treatment. The patient would come back for follow-up visit in 3 weeks for reevaluation and management of any adverse effect of his treatment. He was advised to call immediately if he has any concerning symptoms in the interval. The patient voices understanding of current disease status and treatment options and is in agreement with the current care plan.  All questions were answered. The patient knows to call the clinic with any problems, questions or concerns. We can certainly see the patient much sooner if necessary.  Disclaimer: This note was dictated with voice recognition software. Similar sounding words can inadvertently be transcribed and may not be corrected upon review.

## 2014-10-10 NOTE — Progress Notes (Signed)
Per Dr Julien Nordmann it is okay tot treat pt today with chemo and todays labs.

## 2014-10-11 ENCOUNTER — Encounter: Payer: Self-pay | Admitting: Cardiothoracic Surgery

## 2014-10-11 ENCOUNTER — Ambulatory Visit
Admission: RE | Admit: 2014-10-11 | Discharge: 2014-10-11 | Disposition: A | Discharge status: Home / Self Care | Payer: Medicare Other | Source: Ambulatory Visit | Attending: Cardiothoracic Surgery | Admitting: Cardiothoracic Surgery

## 2014-10-11 ENCOUNTER — Ambulatory Visit (INDEPENDENT_AMBULATORY_CARE_PROVIDER_SITE_OTHER): Payer: Self-pay | Admitting: Cardiothoracic Surgery

## 2014-10-11 VITALS — BP 92/54 | HR 84 | Resp 20 | Ht 74.0 in | Wt 198.0 lb

## 2014-10-11 DIAGNOSIS — Z902 Acquired absence of lung [part of]: Secondary | ICD-10-CM

## 2014-10-11 DIAGNOSIS — Z9889 Other specified postprocedural states: Secondary | ICD-10-CM

## 2014-10-11 DIAGNOSIS — C3432 Malignant neoplasm of lower lobe, left bronchus or lung: Secondary | ICD-10-CM

## 2014-10-11 NOTE — Progress Notes (Signed)
PCP is [name], MD Referring Provider is Jacki Cones, MD  Chief Complaint  Patient presents with  . 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 He is currently in the process of receiving adjunctive chemotherapy by Dr. Julien Nordmann. He is tolerating therapy well. He denies shortness of breath. He has no postthoracotomy pain. Chest x-ray today shows no evidence recurrent disease, clear lung fields bilaterally His blood pressure medication was changed because of insurance coverage and today his blood pressure is low 76-28 systolic so we will have him reduce his new medication to one half tablet daily-Micardis HCT  Past Medical History  Diagnosis Date  . CAD S/P percutaneous coronary angioplasty January 2006    Abnormal Myoview with inferolateral ischemia: --> 80% prox LAD lesion PCI: Taxus DES 3.0 mm x 16 mm-;; Myoview June 2009 no ischemia or infarction with attenuation artifact  . Hypertension   . Dyslipidemia, goal LDL below 70     on simvastatin and tricor  . Osteoarthritis   . BPH (benign prostatic hyperplasia)   . Squamous cell lung cancer : Left lower lobe  08/03/2014    Bronchoscopy showed extrinsic compression of the left lower lobe with endobronchial biopsies were negative. He subsequently had a needle biopsy which diagnosed the mass as squamous cell carcinoma   . GERD (gastroesophageal reflux disease)     Past Surgical History  Procedure Laterality Date  . Nm myoview ltd  07/2004    inferolatera wall ischemia  . Nm myocar perf wall motion  January 06, 2008     treadmill  myoview - tissue attenuation but no ischemia or infarction  . Doppler echocardiography  June 18 ,2009    mild concentric LVH, NORMAL ef, MILDLY  DILATED LEFT ATRIUM  AND MILD SCLEROTIC  AORTIC  VALVE AND OTHERWISE  NORMAL  . Cardiac catheterization  07/2004    80% proximal LAD lesion--PCI, EF 60%  .  Percutaneous coronary stent intervention (pci-s)  2/27/ 2006    TAXUS DES 3.0 mm x 16 mm stent ->  PROX  LAD  . Renal doppler  05/03/2007    rgt and lft renal arteries normal patency; rgt and lft kidneys normal size  . Umbilical hernia repair    . Appendectomy    . Cataract extraction    . Video assisted thoracoscopy (vats)/ lobectomy Left 08/10/2014    Procedure: VIDEO ASSISTED THORACOSCOPY (VATS)/ LOBECTOMY;  Surgeon: Ivin Poot, MD;  Location: [place];  Service: Thoracic;  Laterality: Left;  . Video bronchoscopy N/A 08/10/2014    Procedure: VIDEO BRONCHOSCOPY;  Surgeon: Ivin Poot, MD;  Location: [facility] OR;  Service: Thoracic;  Laterality: N/A;    Family History  Problem Relation Age of Onset  . Cancer Mother   . Heart disease Mother   . Heart disease Father   . Parkinsonism Brother     Social History History  Substance Use Topics  . Smoking status: Former Smoker    Types: Cigarettes    Quit date: 10/25/1982  . Smokeless tobacco: Not on file  . Alcohol Use: No    Current Outpatient Prescriptions  Medication Sig Dispense Refill  . acetaminophen (TYLENOL) 325 MG tablet Take 650 mg by mouth every 6 (six) hours as needed (pain).    Marland Kitchen aspirin EC 325 MG tablet Take 325 mg by mouth daily.    Marland Kitchen  fenofibrate (TRICOR) 145 MG tablet TAKE 1 TABLET BY MOUTH EVERY DAY (Patient taking differently: Take 145 mg by mouth daily. ) 90 tablet 3  . isosorbide mononitrate (IMDUR) 30 MG 24 hr tablet Take 1 tablet (30 mg total) by mouth daily. 90 tablet 3  . metoprolol succinate (TOPROL XL) 25 MG 24 hr tablet Take 1 tablet (25 mg total) by mouth daily. 90 tablet 3  . simvastatin (ZOCOR) 40 MG tablet TAKE 1 TABLET EVERY DAY (Patient taking differently: Take 40 mg by mouth daily at 6 PM. TAKE 1 TABLET EVERY DAY) 90 tablet 3  . telmisartan-hydrochlorothiazide (MICARDIS HCT) 40-12.5 MG per tablet Take 1 tablet by mouth daily. 90 tablet 3  . traMADol (ULTRAM) 50 MG tablet Take 50 mg by mouth every 6  (six) hours as needed for moderate pain.    Marland Kitchen lactulose (CHRONULAC) 10 GM/15ML solution Take 30 mLs (20 g total) by mouth daily as needed for mild constipation. (Patient not taking: Reported on 09/19/2014) 240 mL 1  . prochlorperazine (COMPAZINE) 10 MG tablet Take 1 tablet (10 mg total) by mouth every 6 (six) hours as needed for nausea or vomiting. (Patient not taking: Reported on 10/11/2014) 30 tablet 0   No current facility-administered medications for this visit.    Allergies  Allergen Reactions  . Niaspan [Niacin Er] Other (See Comments)    flushing    Review of Systems  Doing well living alone Has not returned to his previous job yet  BP 92/54 mmHg  Pulse 84  Resp 20  Ht 6\' 2"  (1.88 m)  Wt 198 lb (89.812 kg)  BMI 25.41 kg/m2  SpO2 98% Physical Exam Alert and comfortable Breath sounds clear and equal No adenopathy Heart rhythm regular  Diagnostic Tests: Chest x-ray status post left lower lobectomy with clear lung fields, no postop pleural effusion, no evidence recurrent or new disease.  Impression: Doing well 3 months postop He'll return with chest CT scan at 6 months postop for review of progress  Plan:return in 6 months with CT scan of chest  Len Childs, MD [facility] [phone]

## 2014-10-17 ENCOUNTER — Encounter: Payer: Self-pay | Admitting: Nurse Practitioner

## 2014-10-17 ENCOUNTER — Other Ambulatory Visit (HOSPITAL_BASED_OUTPATIENT_CLINIC_OR_DEPARTMENT_OTHER): Payer: Medicare Other

## 2014-10-17 ENCOUNTER — Ambulatory Visit (HOSPITAL_BASED_OUTPATIENT_CLINIC_OR_DEPARTMENT_OTHER): Payer: Medicare Other

## 2014-10-17 ENCOUNTER — Other Ambulatory Visit: Payer: Self-pay | Admitting: Nurse Practitioner

## 2014-10-17 ENCOUNTER — Ambulatory Visit (HOSPITAL_BASED_OUTPATIENT_CLINIC_OR_DEPARTMENT_OTHER): Payer: Medicare Other | Admitting: Nurse Practitioner

## 2014-10-17 ENCOUNTER — Telehealth: Payer: Self-pay | Admitting: Internal Medicine

## 2014-10-17 DIAGNOSIS — C3490 Malignant neoplasm of unspecified part of unspecified bronchus or lung: Secondary | ICD-10-CM

## 2014-10-17 DIAGNOSIS — C3432 Malignant neoplasm of lower lobe, left bronchus or lung: Secondary | ICD-10-CM

## 2014-10-17 DIAGNOSIS — L03113 Cellulitis of right upper limb: Secondary | ICD-10-CM | POA: Diagnosis not present

## 2014-10-17 DIAGNOSIS — T451X5A Adverse effect of antineoplastic and immunosuppressive drugs, initial encounter: Secondary | ICD-10-CM

## 2014-10-17 DIAGNOSIS — D701 Agranulocytosis secondary to cancer chemotherapy: Secondary | ICD-10-CM

## 2014-10-17 DIAGNOSIS — L0390 Cellulitis, unspecified: Secondary | ICD-10-CM | POA: Insufficient documentation

## 2014-10-17 DIAGNOSIS — C3492 Malignant neoplasm of unspecified part of left bronchus or lung: Secondary | ICD-10-CM

## 2014-10-17 LAB — TECHNOLOGIST REVIEW

## 2014-10-17 LAB — COMPREHENSIVE METABOLIC PANEL (CC13)
ALK PHOS: 77 U/L (ref 40–150)
ALT: 53 U/L (ref 0–55)
AST: 73 U/L — AB (ref 5–34)
Albumin: 3.6 g/dL (ref 3.5–5.0)
Anion Gap: 8 mEq/L (ref 3–11)
BILIRUBIN TOTAL: 0.36 mg/dL (ref 0.20–1.20)
BUN: 18 mg/dL (ref 7.0–26.0)
CO2: 28 mEq/L (ref 22–29)
Calcium: 9.4 mg/dL (ref 8.4–10.4)
Chloride: 105 mEq/L (ref 98–109)
Creatinine: 1 mg/dL (ref 0.7–1.3)
EGFR: 69 mL/min/{1.73_m2} — AB (ref >90)
Glucose: 82 mg/dl (ref 70–140)
Potassium: 3.7 mEq/L (ref 3.5–5.1)
SODIUM: 142 meq/L (ref 136–145)
TOTAL PROTEIN: 6.5 g/dL (ref 6.4–8.3)

## 2014-10-17 LAB — CBC WITH DIFFERENTIAL/PLATELET
BASO%: 1.4 % (ref 0.0–2.0)
BASOS ABS: 0 10*3/uL (ref 0.0–0.1)
EOS%: 0.6 % (ref 0.0–7.0)
Eosinophils Absolute: 0 10*3/uL (ref 0.0–0.5)
HCT: 33.7 % — ABNORMAL LOW (ref 38.4–49.9)
HGB: 10.9 g/dL — ABNORMAL LOW (ref 13.0–17.1)
LYMPH%: 57.9 % — ABNORMAL HIGH (ref 14.0–49.0)
MCH: 28.8 pg (ref 27.2–33.4)
MCHC: 32.5 g/dL (ref 32.0–36.0)
MCV: 88.8 fL (ref 79.3–98.0)
MONO#: 0.3 10*3/uL (ref 0.1–0.9)
MONO%: 12.1 % (ref 0.0–14.0)
NEUT#: 0.6 10*3/uL — ABNORMAL LOW (ref 1.5–6.5)
NEUT%: 28 % — AB (ref 39.0–75.0)
Platelets: 313 10*3/uL (ref 140–400)
RBC: 3.79 10*6/uL — AB (ref 4.20–5.82)
RDW: 14.1 % (ref 11.0–14.6)
WBC: 2.3 10*3/uL — ABNORMAL LOW (ref 4.0–10.3)
lymph#: 1.3 10*3/uL (ref 0.9–3.3)

## 2014-10-17 MED ORDER — TBO-FILGRASTIM 480 MCG/0.8ML ~~LOC~~ SOSY: 480.0000 ug | PREFILLED_SYRINGE | Freq: Once | SUBCUTANEOUS | Status: DC

## 2014-10-17 MED ORDER — TBO-FILGRASTIM 480 MCG/0.8ML ~~LOC~~ SOSY
480.0000 ug | PREFILLED_SYRINGE | Freq: Once | SUBCUTANEOUS | Status: AC
  Administered 2014-10-17: 480 ug via SUBCUTANEOUS
  Filled 2014-10-17: qty 0.8

## 2014-10-17 MED ORDER — CEPHALEXIN 500 MG PO CAPS: 500.0000 mg | ORAL_CAPSULE | Freq: Four times a day (QID) | ORAL | Status: DC

## 2014-10-17 NOTE — Assessment & Plan Note (Addendum)
Patient states he received chemotherapy via peripheral IV to the right arm on 10/10/2014.  He later developed erythema and very mild edema to right forearm.  He also developed some mild tenderness to the site.  He denies any recent fevers or chills.  On exam patient with some erythema and trace edema from his right wrist to area just above the right elbow.  There is no warmth, tenderness, or red streaks on exam.  Given patient's neutropenic status-we'll hold chemotherapy today and prescribed Keflex for apparently resolving cellulitis.  Patient was advised to call/return if his symptoms persist or worsen whatsoever.

## 2014-10-17 NOTE — Patient Instructions (Signed)
Neutropenia Neutropenia is a condition that occurs when the level of a certain type of white blood cell (neutrophil) in your body becomes lower than normal. Neutrophils are made in the bone marrow and fight infections. These cells protect against bacteria and viruses. The fewer neutrophils you have, and the longer your body remains without them, the greater your risk of getting a severe infection becomes. CAUSES  The cause of neutropenia may be hard to determine. However, it is usually due to 3 main problems:   Decreased production of neutrophils. This may be due to:  Certain medicines such as chemotherapy.  Genetic problems.  Cancer.  Radiation treatments.  Vitamin deficiency.  Some pesticides.  Increased destruction of neutrophils. This may be due to:  Overwhelming infections.  Hemolytic anemia. This is when the body destroys its own blood cells.  Chemotherapy.  Neutrophils moving to areas of the body where they cannot fight infections. This may be due to:  Dialysis procedures.  Conditions where the spleen becomes enlarged. Neutrophils are held in the spleen and are not available to the rest of the body.  Overwhelming infections. The neutrophils are held in the area of the infection and are not available to the rest of the body. SYMPTOMS  There are no specific symptoms of neutropenia. The lack of neutrophils can result in an infection, and an infection can cause various problems. DIAGNOSIS  Diagnosis is made by a blood test. A complete blood count is performed. The normal level of neutrophils in human blood differs with age and race. Infants have lower counts than older children and adults. African Americans have lower counts than Caucasians or Asians. The average adult level is 1500 cells/mm3 of blood. Neutrophil counts are interpreted as follows:  Greater than 1000 cells/mm3 gives normal protection against infection.  500 to 1000 cells/mm3 gives an increased risk for  infection.  200 to 500 cells/mm3 is a greater risk for severe infection.  Lower than 200 cells/mm3 is a marked risk of infection. This may require hospitalization and treatment with antibiotic medicines. TREATMENT  Treatment depends on the underlying cause, severity, and presence of infections or symptoms. It also depends on your health. Your caregiver will discuss the treatment plan with you. Mild cases are often easily treated and have a good outcome. Preventative measures may also be started to limit your risk of infections. Treatment can include:  Taking antibiotics.  Stopping medicines that are known to cause neutropenia.  Correcting nutritional deficiencies by eating green vegetables to supply folic acid and taking vitamin B supplements.  Stopping exposure to pesticides if your neutropenia is related to pesticide exposure.  Taking a blood growth factor called sargramostim, pegfilgrastim, or filgrastim if you are undergoing chemotherapy for cancer. This stimulates white blood cell production.  Removal of the spleen if you have Felty's syndrome and have repeated infections. HOME CARE INSTRUCTIONS   Follow your caregiver's instructions about when you need to have blood work done.  Wash your hands often. Make sure others who come in contact with you also wash their hands.  Wash raw fruits and vegetables before eating them. They can carry bacteria and fungi.  Avoid people with colds or spreadable (contagious) diseases (chickenpox, herpes zoster, influenza).  Avoid large crowds.  Avoid construction areas. The dust can release fungus into the air.  Be cautious around children in daycare or school environments.  Take care of your respiratory system by coughing and deep breathing.  Bathe daily.  Protect your skin from cuts and   burns.  Do not work in the garden or with flowers and plants.  Care for the mouth before and after meals by brushing with a soft toothbrush. If you have  mucositis, do not use mouthwash. Mouthwash contains alcohol and can dry out the mouth even more.  Clean the area between the genitals and the anus (perineal area) after urination and bowel movements. Women need to wipe from front to back.  Use a water soluble lubricant during sexual intercourse and practice good hygiene after. Do not have intercourse if you are severely neutropenic. Check with your caregiver for guidelines.  Exercise daily as tolerated.  Avoid people who were vaccinated with a live vaccine in the past 30 days. You should not receive live vaccines (polio, typhoid).  Do not provide direct care for pets. Avoid animal droppings. Do not clean litter boxes and bird cages.  Do not share food utensils.  Do not use tampons, enemas, or rectal suppositories unless directed by your caregiver.  Use an electric razor to remove hair.  Wash your hands after handling magazines, letters, and newspapers. SEEK IMMEDIATE MEDICAL CARE IF:   You have a fever.  You have chills or start to shake.  You feel nauseous or vomit.  You develop mouth sores.  You develop aches and pains.  You have redness and swelling around open wounds.  Your skin is warm to the touch.  You have pus coming from your wounds.  You develop swollen lymph nodes.  You feel weak or fatigued.  You develop red streaks on the skin. MAKE SURE YOU:  Understand these instructions.  Will watch your condition.  Will get help right away if you are not doing well or get worse. Document Released: 12/27/2001 Document Revised: 09/29/2011 Document Reviewed: 01/24/2011 ExitCare Patient Information 2015 [employer]. This information is not intended to replace advice given to you by your health care provider. Make sure you discuss any questions you have with your health care provider.  

## 2014-10-17 NOTE — Assessment & Plan Note (Signed)
Chemotherapy induced neutropenia today with an ANC of 0.6.  Patient denies any recent fevers or chills.  Will give patient Granix 480 g subcutaneously today and tomorrow for growth factor support.  Briefly reviewed all neutropenia guidelines with the patient again today.

## 2014-10-17 NOTE — Progress Notes (Signed)
1020 Noted dark reddish area starting above the ac of the right arm and extends down into the forearm. Area is warm to touch and slightly tender. Pt noticed this on 10/14/14.  [place] NP assessed pt and ordered Granix 449mcg for ANC 0.6, antibiotics ordered and chemotherapy held today. Pt verbalized understanding of plan of care.

## 2014-10-17 NOTE — Progress Notes (Signed)
Patient given information on neutropenia and several face masks at discharge. Patient agreeable to pickup antibiotic prescribed by Selena Lesser, NP today and to return tomorrow as scheduled for another granix injection.

## 2014-10-17 NOTE — Telephone Encounter (Signed)
lvm for pt regarding to 3.30 and 4.5 appts

## 2014-10-17 NOTE — Assessment & Plan Note (Signed)
Patient is status post left lobectomy on 08/10/2014.  He was scheduled to receive cycle 2, day 8 of the carboplatin/gemcitabine chemotherapy regimen; but was found to be neutropenic.  Chemotherapy will be held today; and patient will receive Granix 480 g injection both today and tomorrow for growth factor support.  Will reschedule patient for labs, follow up visit, and his next cycle of chemotherapy for this coming Tuesday, 10/24/2014.

## 2014-10-17 NOTE — Progress Notes (Signed)
[facility]EMENT [facility]   HPI: Jeremy Deleon 81 y.o. male diagnosed with lung cancer.  Patient is status post lobectomy.  Currently undergoing carboplatin/gemcitabine chemotherapy regimen.  Patient presents to the [facility] today to receive cycle 2, day 8 of his carboplatin/gemcitabine chemotherapy regimen.  However, patient was found to be neutropenic with today's labs with an ANC of 0.6.  Patient also reports that he experienced erythema, warmth, and tenderness to his right forearm following his last chemotherapy via peripheral IV.  He denies any recent fevers or chills.   HPI  ROS  Past Medical History  Diagnosis Date  . CAD S/P percutaneous coronary angioplasty January 2006    Abnormal Myoview with inferolateral ischemia: --> 80% prox LAD lesion PCI: Taxus DES 3.0 mm x 16 mm-;; Myoview June 2009 no ischemia or infarction with attenuation artifact  . Hypertension   . Dyslipidemia, goal LDL below 70     on simvastatin and tricor  . Osteoarthritis   . BPH (benign prostatic hyperplasia)   . Squamous cell lung cancer : Left lower lobe  08/03/2014    Bronchoscopy showed extrinsic compression of the left lower lobe with endobronchial biopsies were negative. He subsequently had a needle biopsy which diagnosed the mass as squamous cell carcinoma   . GERD (gastroesophageal reflux disease)     Past Surgical History  Procedure Laterality Date  . Nm myoview ltd  07/2004    inferolatera wall ischemia  . Nm myocar perf wall motion  January 06, 2008     treadmill  myoview - tissue attenuation but no ischemia or infarction  . Doppler echocardiography  June 18 ,2009    mild concentric LVH, NORMAL ef, MILDLY  DILATED LEFT ATRIUM  AND MILD SCLEROTIC  AORTIC  VALVE AND OTHERWISE  NORMAL  . Cardiac catheterization  07/2004    80% proximal LAD lesion--PCI, EF 60%  . Percutaneous coronary stent intervention (pci-s)  2/27/ 2006    TAXUS DES 3.0 mm x 16 mm stent ->  PROX  LAD  . Renal doppler   05/03/2007    rgt and lft renal arteries normal patency; rgt and lft kidneys normal size  . Umbilical hernia repair    . Appendectomy    . Cataract extraction    . Video assisted thoracoscopy (vats)/ lobectomy Left 08/10/2014    Procedure: VIDEO ASSISTED THORACOSCOPY (VATS)/ LOBECTOMY;  Surgeon: Ivin Poot, MD;  Location: [facility];  Service: Thoracic;  Laterality: Left;  . Video bronchoscopy N/A 08/10/2014    Procedure: VIDEO BRONCHOSCOPY;  Surgeon: Ivin Poot, MD;  Location: [facility] OR;  Service: Thoracic;  Laterality: N/A;    has Dyslipidemia, goal LDL below 70; CAD S/P percutaneous coronary angioplasty -- Taxus DES in Prox LAD; Essential hypertension; Squamous cell lung cancer : Left lower lobe ; Preoperative cardiovascular examination; S/P lobectomy of lung; Cellulitis; and Chemotherapy induced neutropenia on his problem list.    is allergic to niaspan.    Medication List       This list is accurate as of: 10/17/14  1:27 PM.  Always use your most recent med list.               acetaminophen 325 MG tablet  Commonly known as:  TYLENOL  Take 650 mg by mouth every 6 (six) hours as needed (pain).     aspirin EC 325 MG tablet  Take 325 mg by mouth daily.     cephALEXin 500 MG capsule  Commonly known  as:  KEFLEX  Take 1 capsule (500 mg total) by mouth 4 (four) times daily.     fenofibrate 145 MG tablet  Commonly known as:  TRICOR  TAKE 1 TABLET BY MOUTH EVERY DAY     isosorbide mononitrate 30 MG 24 hr tablet  Commonly known as:  IMDUR  Take 1 tablet (30 mg total) by mouth daily.     lactulose 10 GM/15ML solution  Commonly known as:  CHRONULAC  Take 30 mLs (20 g total) by mouth daily as needed for mild constipation.     metoprolol succinate 25 MG 24 hr tablet  Commonly known as:  TOPROL XL  Take 1 tablet (25 mg total) by mouth daily.     prochlorperazine 10 MG tablet  Commonly known as:  COMPAZINE  Take 1 tablet (10 mg total) by mouth every 6 (six) hours as needed for  nausea or vomiting.     simvastatin 40 MG tablet  Commonly known as:  ZOCOR  TAKE 1 TABLET EVERY DAY     telmisartan-hydrochlorothiazide 40-12.5 MG per tablet  Commonly known as:  MICARDIS HCT  Take 1 tablet by mouth daily.     traMADol 50 MG tablet  Commonly known as:  ULTRAM  Take 50 mg by mouth every 6 (six) hours as needed for moderate pain.         PHYSICAL EXAMINATION  Oncology Vitals 10/17/2014 10/11/2014 10/11/2014 10/10/2014 09/26/2014 09/19/2014 09/19/2014  Height - - 188 cm 188 cm - - 188 cm  Weight - - 89.812 kg 89.404 kg - - 88.905 kg  Weight (lbs) - - 198 lbs 197 lbs 2 oz - - 196 lbs  BMI (kg/m2) - - 25.42 kg/m2 25.31 kg/m2 - - 25.16 kg/m2  Temp 97 - - 97.5 97.6 97.8 97.7  Pulse 56 84 92 66 60 66 79  Resp 20 - $Re'20 18 18 17 18  'idX$ SpO2 100 - 98 100 - 99 -  BSA (m2) - - 2.17 m2 2.16 m2 - - 2.15 m2   BP Readings from Last 3 Encounters:  10/17/14 118/57  10/11/14 92/54  10/10/14 113/58    Physical Exam  Constitutional: He is oriented to person, place, and time. Vital signs are normal. He appears unhealthy.  HENT:  Head: Normocephalic and atraumatic.  Mouth/Throat: Oropharynx is clear and moist.  Eyes: Conjunctivae and EOM are normal. Pupils are equal, round, and reactive to light. Right eye exhibits no discharge. Left eye exhibits no discharge. No scleral icterus.  Neck: Normal range of motion.  Pulmonary/Chest: Effort normal. No respiratory distress.  Musculoskeletal: Normal range of motion. He exhibits edema. He exhibits no tenderness.  Trace edema to the right forearm at area of cellulitis.  Neurological: He is alert and oriented to person, place, and time.  Skin: Skin is warm and dry. No rash noted. There is erythema. No pallor.  Patient has area of apparent resolving cellulitis from his right wrist to area just above his right elbow with mild erythema.  There is also some trace edema to the site.  There is no tenderness, warmth, or red streaks on exam.  Psychiatric:  Affect normal.  Nursing note and vitals reviewed.   LABORATORY DATA:. Appointment on 10/17/2014  Component Date Value Ref Range Status  . WBC 10/17/2014 2.3* 4.0 - 10.3 10e3/uL Final  . NEUT# 10/17/2014 0.6* 1.5 - 6.5 10e3/uL Final  . HGB 10/17/2014 10.9* 13.0 - 17.1 g/dL Final  . HCT 10/17/2014 33.7* 38.4 - 49.9 %  Final  . Platelets 10/17/2014 313  140 - 400 10e3/uL Final  . MCV 10/17/2014 88.8  79.3 - 98.0 fL Final  . MCH 10/17/2014 28.8  27.2 - 33.4 pg Final  . MCHC 10/17/2014 32.5  32.0 - 36.0 g/dL Final  . RBC 10/17/2014 3.79* 4.20 - 5.82 10e6/uL Final  . RDW 10/17/2014 14.1  11.0 - 14.6 % Final  . lymph# 10/17/2014 1.3  0.9 - 3.3 10e3/uL Final  . MONO# 10/17/2014 0.3  0.1 - 0.9 10e3/uL Final  . Eosinophils Absolute 10/17/2014 0.0  0.0 - 0.5 10e3/uL Final  . Basophils Absolute 10/17/2014 0.0  0.0 - 0.1 10e3/uL Final  . NEUT% 10/17/2014 28.0* 39.0 - 75.0 % Final  . LYMPH% 10/17/2014 57.9* 14.0 - 49.0 % Final  . MONO% 10/17/2014 12.1  0.0 - 14.0 % Final  . EOS% 10/17/2014 0.6  0.0 - 7.0 % Final  . BASO% 10/17/2014 1.4  0.0 - 2.0 % Final  . Sodium 10/17/2014 142  136 - 145 mEq/L Final  . Potassium 10/17/2014 3.7  3.5 - 5.1 mEq/L Final  . Chloride 10/17/2014 105  98 - 109 mEq/L Final  . CO2 10/17/2014 28  22 - 29 mEq/L Final  . Glucose 10/17/2014 82  70 - 140 mg/dl Final  . BUN 10/17/2014 18.0  7.0 - 26.0 mg/dL Final  . Creatinine 10/17/2014 1.0  0.7 - 1.3 mg/dL Final  . Total Bilirubin 10/17/2014 0.36  0.20 - 1.20 mg/dL Final  . Alkaline Phosphatase 10/17/2014 77  40 - 150 U/L Final  . AST 10/17/2014 73* 5 - 34 U/L Final  . ALT 10/17/2014 53  0 - 55 U/L Final  . Total Protein 10/17/2014 6.5  6.4 - 8.3 g/dL Final  . Albumin 10/17/2014 3.6  3.5 - 5.0 g/dL Final  . Calcium 10/17/2014 9.4  8.4 - 10.4 mg/dL Final  . Anion Gap 10/17/2014 8  3 - 11 mEq/L Final  . EGFR 10/17/2014 69* >90 ml/min/1.73 m2 Final   eGFR is calculated using the CKD-EPI Creatinine Equation (2009)  .  Technologist Review 10/17/2014 Metas and Myelocytes present, Occ Variant lymph   Final     RADIOGRAPHIC STUDIES: No results found.  ASSESSMENT/PLAN:    Cellulitis Patient states he received chemotherapy via peripheral IV to the right arm on 10/10/2014.  He later developed erythema and very mild edema to right forearm.  He also developed some mild tenderness to the site.  He denies any recent fevers or chills.  On exam patient with some erythema and trace edema from his right wrist to area just above the right elbow.  There is no warmth, tenderness, or red streaks on exam.  Given patient's neutropenic status-we'll hold chemotherapy today and prescribed Keflex for apparently resolving cellulitis.  Patient was advised to call/return if his symptoms persist or worsen whatsoever.   Chemotherapy induced neutropenia Chemotherapy induced neutropenia today with an ANC of 0.6.  Patient denies any recent fevers or chills.  Will give patient Granix 480 g subcutaneously today and tomorrow for growth factor support.  Briefly reviewed all neutropenia guidelines with the patient again today.   Squamous cell lung cancer : Left lower lobe  Patient is status post left lobectomy on 08/10/2014.  He was scheduled to receive cycle 2, day 8 of the carboplatin/gemcitabine chemotherapy regimen; but was found to be neutropenic.  Chemotherapy will be held today; and patient will receive Granix 480 g injection both today and tomorrow for growth factor support.  Will  reschedule patient for labs, follow up visit, and his next cycle of chemotherapy for this coming Tuesday, 10/24/2014.   Patient stated understanding of all instructions; and was in agreement with this plan of care. The patient knows to call the clinic with any problems, questions or concerns.   Review/collaboration with Dr. Julien Nordmann regarding all aspects of patient's visit today.   Total time spent with patient was 25 minutes;  with greater than 75  percent of that time spent in face to face counseling regarding patient's symptoms,  and coordination of care and follow up.  Disclaimer: This note was dictated with voice recognition software. Similar sounding words can inadvertently be transcribed and may not be corrected upon review.   Drue Second, NP 10/17/2014

## 2014-10-18 ENCOUNTER — Ambulatory Visit (HOSPITAL_BASED_OUTPATIENT_CLINIC_OR_DEPARTMENT_OTHER): Payer: Medicare Other

## 2014-10-18 ENCOUNTER — Telehealth: Payer: Self-pay | Admitting: Nurse Practitioner

## 2014-10-18 VITALS — BP 123/62 | HR 70 | Temp 97.7°F

## 2014-10-18 DIAGNOSIS — C3432 Malignant neoplasm of lower lobe, left bronchus or lung: Secondary | ICD-10-CM

## 2014-10-18 DIAGNOSIS — D701 Agranulocytosis secondary to cancer chemotherapy: Secondary | ICD-10-CM

## 2014-10-18 DIAGNOSIS — C3490 Malignant neoplasm of unspecified part of unspecified bronchus or lung: Secondary | ICD-10-CM

## 2014-10-18 MED ORDER — TBO-FILGRASTIM 480 MCG/0.8ML ~~LOC~~ SOSY
480.0000 ug | PREFILLED_SYRINGE | Freq: Once | SUBCUTANEOUS | Status: AC
  Administered 2014-10-18: 480 ug via SUBCUTANEOUS
  Filled 2014-10-18: qty 0.8

## 2014-10-18 NOTE — Telephone Encounter (Signed)
Pt states he is still taking his Keflex c/o Cellulitis to right arm and doing fine.

## 2014-10-24 ENCOUNTER — Telehealth: Payer: Self-pay | Admitting: Internal Medicine

## 2014-10-24 ENCOUNTER — Ambulatory Visit (HOSPITAL_BASED_OUTPATIENT_CLINIC_OR_DEPARTMENT_OTHER): Payer: Medicare Other | Admitting: Physician Assistant

## 2014-10-24 ENCOUNTER — Encounter: Payer: Self-pay | Admitting: Physician Assistant

## 2014-10-24 ENCOUNTER — Ambulatory Visit: Payer: Medicare Other

## 2014-10-24 ENCOUNTER — Other Ambulatory Visit (HOSPITAL_BASED_OUTPATIENT_CLINIC_OR_DEPARTMENT_OTHER): Payer: Medicare Other

## 2014-10-24 ENCOUNTER — Other Ambulatory Visit: Payer: Medicare Other

## 2014-10-24 VITALS — BP 136/65 | HR 70 | Temp 97.5°F | Resp 18 | Ht 74.0 in | Wt 204.0 lb

## 2014-10-24 DIAGNOSIS — C3432 Malignant neoplasm of lower lobe, left bronchus or lung: Secondary | ICD-10-CM | POA: Diagnosis not present

## 2014-10-24 DIAGNOSIS — L03113 Cellulitis of right upper limb: Secondary | ICD-10-CM

## 2014-10-24 DIAGNOSIS — C3490 Malignant neoplasm of unspecified part of unspecified bronchus or lung: Secondary | ICD-10-CM

## 2014-10-24 DIAGNOSIS — C3492 Malignant neoplasm of unspecified part of left bronchus or lung: Secondary | ICD-10-CM

## 2014-10-24 LAB — CBC WITH DIFFERENTIAL/PLATELET
BASO%: 0.6 % (ref 0.0–2.0)
BASOS ABS: 0.1 10*3/uL (ref 0.0–0.1)
EOS%: 0.2 % (ref 0.0–7.0)
Eosinophils Absolute: 0 10*3/uL (ref 0.0–0.5)
HEMATOCRIT: 33.1 % — AB (ref 38.4–49.9)
HEMOGLOBIN: 11 g/dL — AB (ref 13.0–17.1)
LYMPH%: 18.6 % (ref 14.0–49.0)
MCH: 30.6 pg (ref 27.2–33.4)
MCHC: 33.2 g/dL (ref 32.0–36.0)
MCV: 91.9 fL (ref 79.3–98.0)
MONO#: 2 10*3/uL — AB (ref 0.1–0.9)
MONO%: 21.9 % — ABNORMAL HIGH (ref 0.0–14.0)
NEUT%: 58.7 % (ref 39.0–75.0)
NEUTROS ABS: 5.3 10*3/uL (ref 1.5–6.5)
Platelets: 109 10*3/uL — ABNORMAL LOW (ref 140–400)
RBC: 3.6 10*6/uL — ABNORMAL LOW (ref 4.20–5.82)
RDW: 16.4 % — AB (ref 11.0–14.6)
WBC: 9.1 10*3/uL (ref 4.0–10.3)
lymph#: 1.7 10*3/uL (ref 0.9–3.3)

## 2014-10-24 LAB — COMPREHENSIVE METABOLIC PANEL (CC13)
ALT: 19 U/L (ref 0–55)
AST: 32 U/L (ref 5–34)
Albumin: 3.4 g/dL — ABNORMAL LOW (ref 3.5–5.0)
Alkaline Phosphatase: 98 U/L (ref 40–150)
Anion Gap: 11 mEq/L (ref 3–11)
BUN: 15.8 mg/dL (ref 7.0–26.0)
CALCIUM: 8.9 mg/dL (ref 8.4–10.4)
CHLORIDE: 103 meq/L (ref 98–109)
CO2: 27 meq/L (ref 22–29)
CREATININE: 1.2 mg/dL (ref 0.7–1.3)
EGFR: 58 mL/min/{1.73_m2} — ABNORMAL LOW (ref >90)
Glucose: 88 mg/dl (ref 70–140)
POTASSIUM: 3.7 meq/L (ref 3.5–5.1)
Sodium: 141 mEq/L (ref 136–145)
Total Bilirubin: 0.36 mg/dL (ref 0.20–1.20)
Total Protein: 6.3 g/dL — ABNORMAL LOW (ref 6.4–8.3)

## 2014-10-24 NOTE — Progress Notes (Addendum)
[place] [phone]   [phone]) [phone]  OFFICE PROGRESS NOTE  [name], MD [place] [address]  DIAGNOSIS: Stage IIA (T2b, N0, M0) non-small cell lung cancer consistent with squamous cell carcinoma diagnosed in January 2016  PRIOR THERAPY: Status post left VATS with left lower lobectomy and mediastinal lymph node dissection under the care of Dr. Prescott Gum on 08/10/2014.  CURRENT THERAPY: Adjuvant systemic chemotherapy with carboplatin for AUC of 5 on day 1 and gemcitabine 1000 MG/M2 on days 1 and 8 every 3 weeks. First dose 09/19/2014. Status post 1 cycle and day 1 of cycle #2  INTERVAL HISTORY: Jeremy Deleon 81 y.o. male returns to the clinic today for follow-up visit. Day 8 of cycle #2 was rescheduled to today secondary to "cellulitis" affecting his right arm. He was placed on a 10 day course of Keflex and  Has about 3 days remaining. He continues to have a fair amount of redness and warmth in the arm. This was the arm he last received chemotherapy in. He voiced no other specific complaints. He tolerated the first cycle of his treatment fairly well. He denied having any significant fever or chills, no nausea or vomiting. The patient denied having any significant weight loss or night sweats. He has no chest pain, shortness of breath, cough or hemoptysis.  MEDICAL HISTORY: Past Medical History  Diagnosis Date  . CAD S/P percutaneous coronary angioplasty January 2006    Abnormal Myoview with inferolateral ischemia: --> 80% prox LAD lesion PCI: Taxus DES 3.0 mm x 16 mm-;; Myoview June 2009 no ischemia or infarction with attenuation artifact  . Hypertension   . Dyslipidemia, goal LDL below 70     on simvastatin and tricor  . Osteoarthritis   . BPH (benign prostatic hyperplasia)   . Squamous cell lung cancer : Left lower lobe  08/03/2014    Bronchoscopy showed extrinsic compression of the left lower lobe with  endobronchial biopsies were negative. He subsequently had a needle biopsy which diagnosed the mass as squamous cell carcinoma   . GERD (gastroesophageal reflux disease)     ALLERGIES:  is allergic to niaspan.  MEDICATIONS:  Current Outpatient Prescriptions  Medication Sig Dispense Refill  . acetaminophen (TYLENOL) 325 MG tablet Take 650 mg by mouth every 6 (six) hours as needed (pain).    Marland Kitchen aspirin EC 325 MG tablet Take 325 mg by mouth daily.    . cephALEXin (KEFLEX) 500 MG capsule Take 1 capsule (500 mg total) by mouth 4 (four) times daily. 40 capsule 0  . fenofibrate (TRICOR) 145 MG tablet TAKE 1 TABLET BY MOUTH EVERY DAY (Patient taking differently: Take 145 mg by mouth daily. ) 90 tablet 3  . isosorbide mononitrate (IMDUR) 30 MG 24 hr tablet Take 1 tablet (30 mg total) by mouth daily. 90 tablet 3  . lactulose (CHRONULAC) 10 GM/15ML solution Take 30 mLs (20 g total) by mouth daily as needed for mild constipation. 240 mL 1  . metoprolol succinate (TOPROL XL) 25 MG 24 hr tablet Take 1 tablet (25 mg total) by mouth daily. 90 tablet 3  . prochlorperazine (COMPAZINE) 10 MG tablet Take 1 tablet (10 mg total) by mouth every 6 (six) hours as needed for nausea or vomiting. 30 tablet 0  . simvastatin (ZOCOR) 40 MG tablet TAKE 1 TABLET EVERY DAY (Patient taking differently: Take 40 mg by mouth daily at 6 PM. TAKE 1 TABLET EVERY DAY)  90 tablet 3  . telmisartan-hydrochlorothiazide (MICARDIS HCT) 40-12.5 MG per tablet Take 1 tablet by mouth daily. 90 tablet 3  . traMADol (ULTRAM) 50 MG tablet Take 50 mg by mouth every 6 (six) hours as needed for moderate pain.     No current facility-administered medications for this visit.    SURGICAL HISTORY:  Past Surgical History  Procedure Laterality Date  . Nm myoview ltd  07/2004    inferolatera wall ischemia  . Nm myocar perf wall motion  January 06, 2008     treadmill  myoview - tissue attenuation but no ischemia or infarction  . Doppler echocardiography   June 18 ,2009    mild concentric LVH, NORMAL ef, MILDLY  DILATED LEFT ATRIUM  AND MILD SCLEROTIC  AORTIC  VALVE AND OTHERWISE  NORMAL  . Cardiac catheterization  07/2004    80% proximal LAD lesion--PCI, EF 60%  . Percutaneous coronary stent intervention (pci-s)  2/27/ 2006    TAXUS DES 3.0 mm x 16 mm stent ->  PROX  LAD  . Renal doppler  05/03/2007    rgt and lft renal arteries normal patency; rgt and lft kidneys normal size  . Umbilical hernia repair    . Appendectomy    . Cataract extraction    . Video assisted thoracoscopy (vats)/ lobectomy Left 08/10/2014    Procedure: VIDEO ASSISTED THORACOSCOPY (VATS)/ LOBECTOMY;  Surgeon: Ivin Poot, MD;  Location: [facility];  Service: Thoracic;  Laterality: Left;  . Video bronchoscopy N/A 08/10/2014    Procedure: VIDEO BRONCHOSCOPY;  Surgeon: Ivin Poot, MD;  Location: [facility] OR;  Service: Thoracic;  Laterality: N/A;    REVIEW OF SYSTEMS:  A comprehensive review of systems was negative.   PHYSICAL EXAMINATION: General appearance: alert, cooperative and no distress Head: Normocephalic, without obvious abnormality, atraumatic Neck: no adenopathy, no JVD, supple, symmetrical, trachea midline and thyroid not enlarged, symmetric, no tenderness/mass/nodules Lymph nodes: Cervical, supraclavicular, and axillary nodes normal. Resp: clear to auscultation bilaterally Back: symmetric, no curvature. ROM normal. No CVA tenderness. Cardio: normal apical impulse GI: soft, non-tender; bowel sounds normal; no masses,  no organomegaly Extremities: right upper extremity with warmth and erythema medial aspect of the bicep/tricep region  ECOG PERFORMANCE STATUS: 1 - Symptomatic but completely ambulatory  Blood pressure 136/65, pulse 70, temperature 97.5 F (36.4 C), temperature source Oral, resp. rate 18, height 6\' 2"  (1.88 m), weight 204 lb (92.534 kg), SpO2 100 %.  LABORATORY DATA: Lab Results  Component Value Date   WBC 9.1 10/24/2014   HGB 11.0* 10/24/2014    HCT 33.1* 10/24/2014   MCV 91.9 10/24/2014   PLT 109* 10/24/2014      Chemistry      Component Value Date/Time   NA 141 10/24/2014 1450   NA 140 08/14/2014 0255   K 3.7 10/24/2014 1450   K 3.6 08/14/2014 0255   CL 105 08/14/2014 0255   CO2 27 10/24/2014 1450   CO2 29 08/14/2014 0255   BUN 15.8 10/24/2014 1450   BUN 19 08/14/2014 0255   CREATININE 1.2 10/24/2014 1450   CREATININE 0.96 08/14/2014 0255      Component Value Date/Time   CALCIUM 8.9 10/24/2014 1450   CALCIUM 8.6 08/14/2014 0255   ALKPHOS 98 10/24/2014 1450   ALKPHOS 49 08/14/2014 0255   AST 32 10/24/2014 1450   AST 20 08/14/2014 0255   ALT 19 10/24/2014 1450   ALT 11 08/14/2014 0255   BILITOT 0.36 10/24/2014 1450   BILITOT  0.9 08/14/2014 0255       RADIOGRAPHIC STUDIES: Dg Chest 2 View  10/11/2014   CLINICAL DATA:  Status post left lower lobectomy on August 10, 2014 for squamous cell malignancy; no current complaints  EXAM: CHEST  2 VIEW  COMPARISON:  PA and lateral chest of August 30, 2014  FINDINGS: There is persistent volume loss at the left lung base. A small amount of pleural fluid continues to blunt the costophrenic angles. No pulmonary parenchymal mass on the left is demonstrated. On the right there is a stable calcified nodule peripherally in the right upper lobe. No suspicious soft tissue masses are evident. The cardiac silhouette and pulmonary vascularity are within the limits of normal. There surgical clips in the left hilar region. The bony thorax exhibits no acute abnormality.  IMPRESSION: There are chronic postsurgical changes of the left lung base. No active cardiopulmonary disease is demonstrated.   Electronically Signed   By: David  Martinique   On: 10/11/2014 13:49    ASSESSMENT AND PLAN: This is a very pleasant 81 years old white male recently diagnosed with a stage IIa non-small cell lung cancer, squamous cell carcinoma status post left lower lobectomy with lymph node dissection. He is  currently undergoing adjuvant chemotherapy with carboplatin and gemcitabine status post 1 cycle. He tolerated the first cycle of his treatment fairly well. He is also status post day 1 of cycle #2.  The patient was discussed with and also seen by Dr. Julien Nordmann. His right arm should be significantly improved if it were solely cellulitis, however, I believe there may be an element of extravasation. The patient is advised to complete his course of Keflex and to apply warm compresses to his arm. We will cancel day 8 of cycle #2. He will return in one week for the start of cycle #3.   He was advised to call immediately if he has any concerning symptoms in the interval. The patient voices understanding of current disease status and treatment options and is in agreement with the current care plan.  All questions were answered. The patient knows to call the clinic with any problems, questions or concerns. We can certainly see the patient much sooner if necessary.  ZIAH, LEANDRO, PA-C 10/24/2014  ADDENDUM: Hematology/Oncology Attending:  I had a face to face encounter with the patient. I recommended his care plan. This is a very pleasant 81 years old white male with history of stage II a non-small cell lung cancer status post left lower lobectomy with mediastinal lymph node dissection. He is currently undergoing adjuvant systemic chemotherapy with carboplatin and gemcitabine status post 1 cycle as well as the first day of the second cycle. He is tolerating his treatment fairly well with no significant adverse effects. He has some cellulitis in the right arm questionable to be secondary to IV extravasation. The patient was started on a course of Keflex. We will also ask him to apply warm compresses to this area. I recommended for the patient to skip day #8 of his treatment until improvement of his cellulitis. He would come back for follow-up visit in one week for evaluation before starting cycle #3. He was  advised to call immediately if he has any concerning symptoms in the interval.  Disclaimer: This note was dictated with voice recognition software. Similar sounding words can inadvertently be transcribed and may not be corrected upon review. Eilleen Kempf., MD 10/29/2014

## 2014-10-24 NOTE — Telephone Encounter (Signed)
gave and printed appt sched and avs fo rpt for April...sed added tx.

## 2014-10-26 ENCOUNTER — Telehealth: Payer: Self-pay | Admitting: *Deleted

## 2014-10-26 ENCOUNTER — Other Ambulatory Visit: Payer: Self-pay | Admitting: Cardiology

## 2014-10-26 NOTE — Telephone Encounter (Signed)
Pt's daughter Rosann Auerbach called with concern regarding knot on pt's arm. Pt recently seen by Burnetta Sabin, [place] on 4/5, treatment was held, affected area was looked at by MD and PA. Per PA,  Advised marsha to continue to put heat on "knot", this is likely not an infection as pt is currently on Keflex. If symptoms worsen notify office.

## 2014-10-28 NOTE — Patient Instructions (Signed)
Complete your course of antibiotics as prescribed Apply warm compresses to your arm Follow up in 1 week, prior to the start of your next scheduled cycle of chemotherapy

## 2014-10-31 ENCOUNTER — Telehealth: Payer: Self-pay | Admitting: Internal Medicine

## 2014-10-31 ENCOUNTER — Other Ambulatory Visit (HOSPITAL_BASED_OUTPATIENT_CLINIC_OR_DEPARTMENT_OTHER): Payer: Medicare Other

## 2014-10-31 ENCOUNTER — Encounter: Payer: Self-pay | Admitting: Physician Assistant

## 2014-10-31 ENCOUNTER — Ambulatory Visit (HOSPITAL_BASED_OUTPATIENT_CLINIC_OR_DEPARTMENT_OTHER): Payer: Medicare Other

## 2014-10-31 ENCOUNTER — Telehealth: Payer: Self-pay | Admitting: General Practice

## 2014-10-31 ENCOUNTER — Ambulatory Visit (HOSPITAL_BASED_OUTPATIENT_CLINIC_OR_DEPARTMENT_OTHER): Payer: Medicare Other | Admitting: Physician Assistant

## 2014-10-31 DIAGNOSIS — C3432 Malignant neoplasm of lower lobe, left bronchus or lung: Secondary | ICD-10-CM | POA: Diagnosis not present

## 2014-10-31 DIAGNOSIS — C3492 Malignant neoplasm of unspecified part of left bronchus or lung: Secondary | ICD-10-CM

## 2014-10-31 DIAGNOSIS — Z5111 Encounter for antineoplastic chemotherapy: Secondary | ICD-10-CM

## 2014-10-31 DIAGNOSIS — R2230 Localized swelling, mass and lump, unspecified upper limb: Secondary | ICD-10-CM | POA: Diagnosis not present

## 2014-10-31 LAB — COMPREHENSIVE METABOLIC PANEL (CC13)
ALT: 10 U/L (ref 0–55)
AST: 27 U/L (ref 5–34)
Albumin: 3.6 g/dL (ref 3.5–5.0)
Alkaline Phosphatase: 84 U/L (ref 40–150)
Anion Gap: 9 mEq/L (ref 3–11)
BUN: 20.5 mg/dL (ref 7.0–26.0)
CO2: 28 mEq/L (ref 22–29)
Calcium: 9 mg/dL (ref 8.4–10.4)
Chloride: 106 mEq/L (ref 98–109)
Creatinine: 1.1 mg/dL (ref 0.7–1.3)
EGFR: 65 mL/min/{1.73_m2} — ABNORMAL LOW (ref >90)
Glucose: 96 mg/dl (ref 70–140)
Potassium: 3.7 mEq/L (ref 3.5–5.1)
Sodium: 142 mEq/L (ref 136–145)
Total Bilirubin: 0.45 mg/dL (ref 0.20–1.20)
Total Protein: 6.7 g/dL (ref 6.4–8.3)

## 2014-10-31 LAB — CBC WITH DIFFERENTIAL/PLATELET
BASO%: 1.3 % (ref 0.0–2.0)
Basophils Absolute: 0.1 10*3/uL (ref 0.0–0.1)
EOS ABS: 0.1 10*3/uL (ref 0.0–0.5)
EOS%: 0.9 % (ref 0.0–7.0)
HCT: 35.5 % — ABNORMAL LOW (ref 38.4–49.9)
HGB: 11.8 g/dL — ABNORMAL LOW (ref 13.0–17.1)
LYMPH%: 22.7 % (ref 14.0–49.0)
MCH: 31 pg (ref 27.2–33.4)
MCHC: 33.2 g/dL (ref 32.0–36.0)
MCV: 93.2 fL (ref 79.3–98.0)
MONO#: 1 10*3/uL — AB (ref 0.1–0.9)
MONO%: 13.8 % (ref 0.0–14.0)
NEUT%: 61.3 % (ref 39.0–75.0)
NEUTROS ABS: 4.3 10*3/uL (ref 1.5–6.5)
Platelets: 262 10*3/uL (ref 140–400)
RBC: 3.81 10*6/uL — AB (ref 4.20–5.82)
RDW: 17.9 % — ABNORMAL HIGH (ref 11.0–14.6)
WBC: 7.1 10*3/uL (ref 4.0–10.3)
lymph#: 1.6 10*3/uL (ref 0.9–3.3)

## 2014-10-31 MED ORDER — SODIUM CHLORIDE 0.9 % IV SOLN
800.0000 mg/m2 | Freq: Once | INTRAVENOUS | Status: AC
  Administered 2014-10-31: 1710 mg via INTRAVENOUS
  Filled 2014-10-31: qty 44.97

## 2014-10-31 MED ORDER — SODIUM CHLORIDE 0.9 % IV SOLN
Freq: Once | INTRAVENOUS | Status: AC
  Administered 2014-10-31: 14:00:00 via INTRAVENOUS

## 2014-10-31 MED ORDER — SODIUM CHLORIDE 0.9 % IV SOLN
Freq: Once | INTRAVENOUS | Status: AC
  Administered 2014-10-31: 14:00:00 via INTRAVENOUS
  Filled 2014-10-31: qty 8

## 2014-10-31 MED ORDER — SODIUM CHLORIDE 0.9 % IV SOLN
342.0000 mg | Freq: Once | INTRAVENOUS | Status: AC
  Administered 2014-10-31: 340 mg via INTRAVENOUS
  Filled 2014-10-31: qty 34

## 2014-10-31 NOTE — Telephone Encounter (Signed)
added appt pt will get sched in tx. °

## 2014-10-31 NOTE — Progress Notes (Addendum)
[place] [place] Telephone:(336) [phone]   Fax:(336) [phone]  OFFICE PROGRESS NOTE  [name], MD [place] [address]  DIAGNOSIS: Stage IIA (T2b, N0, M0) non-small cell lung cancer consistent with squamous cell carcinoma diagnosed in January 2016  PRIOR THERAPY: Status post left VATS with left lower lobectomy and mediastinal lymph node dissection under the care of Dr. Prescott Gum on 08/10/2014.  CURRENT THERAPY: Adjuvant systemic chemotherapy with carboplatin for AUC of 5 on day 1 and gemcitabine 1000 MG/M2 on days 1 and 8 every 3 weeks. First dose 09/19/2014. Status post 1 cycle and day 1 of cycle #2  INTERVAL HISTORY: Jeremy Deleon 81 y.o. male returns to the clinic today for follow-up visit. Day 8 of cycle #2 was rescheduled secondary to "cellulitis" affecting his right arm. We omitted day 8 of cycle # 2.  He completed his course of Keflex.he utilized a heating pad to his right arm and presents with less redness in the area. He has noticed a "knot" in the right upper arm where the bulk of the warmth and redness was a week ago. This "knot" is nontender. He presents today to proceed with day 1 of cycle #3 of his adjuvant chemotherapy with carboplatin and gemcitabine.  He voiced no other specific complaints. He is otherwise tolerating his treatment fairly well.  He denied having any significant fever or chills, no nausea or vomiting. The patient denied having any significant weight loss or night sweats. He has no chest pain, shortness of breath, cough or hemoptysis.  MEDICAL HISTORY: Past Medical History  Diagnosis Date  . CAD S/P percutaneous coronary angioplasty January 2006    Abnormal Myoview with inferolateral ischemia: --> 80% prox LAD lesion PCI: Taxus DES 3.0 mm x 16 mm-;; Myoview June 2009 no ischemia or infarction with attenuation artifact  . Hypertension   . Dyslipidemia, goal LDL below 70     on simvastatin and tricor  .  Osteoarthritis   . BPH (benign prostatic hyperplasia)   . Squamous cell lung cancer : Left lower lobe  08/03/2014    Bronchoscopy showed extrinsic compression of the left lower lobe with endobronchial biopsies were negative. He subsequently had a needle biopsy which diagnosed the mass as squamous cell carcinoma   . GERD (gastroesophageal reflux disease)     ALLERGIES:  is allergic to niaspan.  MEDICATIONS:  Current Outpatient Prescriptions  Medication Sig Dispense Refill  . acetaminophen (TYLENOL) 325 MG tablet Take 650 mg by mouth every 6 (six) hours as needed (pain).    Marland Kitchen aspirin EC 325 MG tablet Take 325 mg by mouth daily.    . cephALEXin (KEFLEX) 500 MG capsule Take 1 capsule (500 mg total) by mouth 4 (four) times daily. 40 capsule 0  . fenofibrate (TRICOR) 145 MG tablet TAKE 1 TABLET BY MOUTH EVERY DAY 90 tablet 1  . isosorbide mononitrate (IMDUR) 30 MG 24 hr tablet TAKE 1 TABLET BY MOUTH DAILY. 90 tablet 1  . lactulose (CHRONULAC) 10 GM/15ML solution Take 30 mLs (20 g total) by mouth daily as needed for mild constipation. 240 mL 1  . metoprolol succinate (TOPROL-XL) 25 MG 24 hr tablet TAKE 1 TABLET BY MOUTH DAILY. 90 tablet 1  . prochlorperazine (COMPAZINE) 10 MG tablet Take 1 tablet (10 mg total) by mouth every 6 (six) hours as needed for nausea or vomiting. 30 tablet 0  . simvastatin (ZOCOR) 40 MG tablet TAKE 1 TABLET BY MOUTH  QDF 90 tablet 1  . telmisartan-hydrochlorothiazide (MICARDIS HCT) 40-12.5 MG per tablet Take 1 tablet by mouth daily. 90 tablet 3  . traMADol (ULTRAM) 50 MG tablet Take 50 mg by mouth every 6 (six) hours as needed for moderate pain.     No current facility-administered medications for this visit.   Facility-Administered Medications Ordered in Other Visits  Medication Dose Route Frequency Provider Last Rate Last Dose  . 0.9 %  sodium chloride infusion   Intravenous Once Wyman Meschke E Deaveon Schoen, PA-C      . CARBOplatin (PARAPLATIN) 340 mg in sodium chloride 0.9 % 100  mL chemo infusion  340 mg Intravenous Once [employer], PA-C      . Gemcitabine HCl (GEMZAR) 1,710 mg in sodium chloride 0.9 % 100 mL chemo infusion  800 mg/m2 (Treatment Plan Actual) Intravenous Once ARMARI FUSSELL, PA-C 290 mL/hr at 10/31/14 1416 1,710 mg at 10/31/14 1416    SURGICAL HISTORY:  Past Surgical History  Procedure Laterality Date  . Nm myoview ltd  07/2004    inferolatera wall ischemia  . Nm myocar perf wall motion  January 06, 2008     treadmill  myoview - tissue attenuation but no ischemia or infarction  . Doppler echocardiography  June 18 ,2009    mild concentric LVH, NORMAL ef, MILDLY  DILATED LEFT ATRIUM  AND MILD SCLEROTIC  AORTIC  VALVE AND OTHERWISE  NORMAL  . Cardiac catheterization  07/2004    80% proximal LAD lesion--PCI, EF 60%  . Percutaneous coronary stent intervention (pci-s)  2/27/ 2006    TAXUS DES 3.0 mm x 16 mm stent ->  PROX  LAD  . Renal doppler  05/03/2007    rgt and lft renal arteries normal patency; rgt and lft kidneys normal size  . Umbilical hernia repair    . Appendectomy    . Cataract extraction    . Video assisted thoracoscopy (vats)/ lobectomy Left 08/10/2014    Procedure: VIDEO ASSISTED THORACOSCOPY (VATS)/ LOBECTOMY;  Surgeon: Ivin Poot, MD;  Location: [facility];  Service: Thoracic;  Laterality: Left;  . Video bronchoscopy N/A 08/10/2014    Procedure: VIDEO BRONCHOSCOPY;  Surgeon: Ivin Poot, MD;  Location: [facility] OR;  Service: Thoracic;  Laterality: N/A;    REVIEW OF SYSTEMS:  A comprehensive review of systems was negative.   PHYSICAL EXAMINATION: General appearance: alert, cooperative and no distress Head: Normocephalic, without obvious abnormality, atraumatic Neck: no adenopathy, no JVD, supple, symmetrical, trachea midline and thyroid not enlarged, symmetric, no tenderness/mass/nodules Lymph nodes: Cervical, supraclavicular, and axillary nodes normal. Resp: clear to auscultation bilaterally Back: symmetric, no curvature. ROM  normal. No CVA tenderness. Cardio: normal apical impulse GI: soft, non-tender; bowel sounds normal; no masses,  no organomegaly Extremities: right upper extremity with significantly decreased erythema and warmth, there is a nontender firm mass approximately 4.5 x 2 cm in the medial aspect of the bicep/tricep region  ECOG PERFORMANCE STATUS: 1 - Symptomatic but completely ambulatory  There were no vitals taken for this visit.  LABORATORY DATA: Lab Results  Component Value Date   WBC 7.1 10/31/2014   HGB 11.8* 10/31/2014   HCT 35.5* 10/31/2014   MCV 93.2 10/31/2014   PLT 262 10/31/2014      Chemistry      Component Value Date/Time   NA 142 10/31/2014 1146   NA 140 08/14/2014 0255   K 3.7 10/31/2014 1146   K 3.6 08/14/2014 0255   CL 105 08/14/2014 0255  CO2 28 10/31/2014 1146   CO2 29 08/14/2014 0255   BUN 20.5 10/31/2014 1146   BUN 19 08/14/2014 0255   CREATININE 1.1 10/31/2014 1146   CREATININE 0.96 08/14/2014 0255      Component Value Date/Time   CALCIUM 9.0 10/31/2014 1146   CALCIUM 8.6 08/14/2014 0255   ALKPHOS 84 10/31/2014 1146   ALKPHOS 49 08/14/2014 0255   AST 27 10/31/2014 1146   AST 20 08/14/2014 0255   ALT 10 10/31/2014 1146   ALT 11 08/14/2014 0255   BILITOT 0.45 10/31/2014 1146   BILITOT 0.9 08/14/2014 0255       RADIOGRAPHIC STUDIES: Dg Chest 2 View  10/11/2014   CLINICAL DATA:  Status post left lower lobectomy on August 10, 2014 for squamous cell malignancy; no current complaints  EXAM: CHEST  2 VIEW  COMPARISON:  PA and lateral chest of August 30, 2014  FINDINGS: There is persistent volume loss at the left lung base. A small amount of pleural fluid continues to blunt the costophrenic angles. No pulmonary parenchymal mass on the left is demonstrated. On the right there is a stable calcified nodule peripherally in the right upper lobe. No suspicious soft tissue masses are evident. The cardiac silhouette and pulmonary vascularity are within the limits  of normal. There surgical clips in the left hilar region. The bony thorax exhibits no acute abnormality.  IMPRESSION: There are chronic postsurgical changes of the left lung base. No active cardiopulmonary disease is demonstrated.   Electronically Signed   By: David  Martinique   On: 10/11/2014 13:49    ASSESSMENT AND PLAN: This is a very pleasant 81 years old white male recently diagnosed with a stage IIa non-small cell lung cancer, squamous cell carcinoma status post left lower lobectomy with lymph node dissection. He is currently undergoing adjuvant chemotherapy with carboplatin and gemcitabine status post 1 cycle. He tolerated the first cycle of his treatment fairly well. He is also status post day 1 of cycle #2.  The patient was discussed with and also seen by Dr. Julien Nordmann. His right arm showed significant improvement in both erythema and warmth however there remains a nontender mass in the medial aspect of the upper arm. This was likely more and extravasation versus cellulitis incident. He was advised to continue applying local heat. He'll proceed with day 1 of cycle #3 of his adjuvant chemotherapy as scheduled today. He will follow-up in 3 weeks prior to the start of cycle #4. He will continue to monitor his arm closely and notify us of any concerning signs or symptoms.   He was advised to call immediately if he has any concerning symptoms in the interval. The patient voices understanding of current disease status and treatment options and is in agreement with the current care plan.  All questions were answered. The patient knows to call the clinic with any problems, questions or concerns. We can certainly see the patient much sooner if necessary.  JACQUAN, SAVAS, PA-C 10/31/2014  ADDENDUM: Hematology/Oncology Attending: I had a face to face encounter with the patient. I recommended his care plan. This is a very pleasant 81 years old white male with stage IIa non-small cell lung cancer status  post left lower lobectomy with mediastinal lymph node dissection and currently undergoing adjuvant chemotherapy with carboplatin and gemcitabine is status post 1 cycle and he is currently undergoing cycle #2. The patient is rating his treatment fairly well and the swelling of the upper extremity has improved except for a  small knot in the elbow area questionable for fluid collection. I recommended for the patient to proceed with cycle #3 as a scheduled. He would come back for follow-up visit in 3 weeks with the next cycle of his treatment. The patient was advised to call immediately if he has any concerning symptoms in the interval.  Disclaimer: This note was dictated with voice recognition software. Similar sounding words can inadvertently be transcribed and may be missed upon review. Eilleen Kempf., MD 11/01/2014

## 2014-10-31 NOTE — Patient Instructions (Signed)
[facility] Discharge Instructions for Patients Receiving Chemotherapy  Today you received the following chemotherapy agents:  Gemzar and Carboplatin  To help prevent nausea and vomiting after your treatment, we encourage you to take your nausea medication as ordered per MD.   If you develop nausea and vomiting that is not controlled by your nausea medication, call the clinic.   BELOW ARE SYMPTOMS THAT SHOULD BE REPORTED IMMEDIATELY:  *FEVER GREATER THAN 100.5 F  *CHILLS WITH OR WITHOUT FEVER  NAUSEA AND VOMITING THAT IS NOT CONTROLLED WITH YOUR NAUSEA MEDICATION  *UNUSUAL SHORTNESS OF BREATH  *UNUSUAL BRUISING OR BLEEDING  TENDERNESS IN MOUTH AND THROAT WITH OR WITHOUT PRESENCE OF ULCERS  *URINARY PROBLEMS  *BOWEL PROBLEMS  UNUSUAL RASH Items with * indicate a potential emergency and should be followed up as soon as possible.  Feel free to call the clinic you have any questions or concerns. The clinic phone number is [phone].  Please show the [facility] at check-in to the Emergency Department and triage nurse.

## 2014-11-01 NOTE — Patient Instructions (Signed)
Continue to apply heat to the right arm Continue with your labs and chemotherapy as scheduled Follow-up in 3 weeks prior to the start of your next scheduled cycle of chemotherapy

## 2014-11-07 ENCOUNTER — Other Ambulatory Visit (HOSPITAL_BASED_OUTPATIENT_CLINIC_OR_DEPARTMENT_OTHER): Payer: Medicare Other

## 2014-11-07 ENCOUNTER — Ambulatory Visit (HOSPITAL_BASED_OUTPATIENT_CLINIC_OR_DEPARTMENT_OTHER): Payer: Medicare Other

## 2014-11-07 ENCOUNTER — Other Ambulatory Visit: Payer: Self-pay | Admitting: *Deleted

## 2014-11-07 VITALS — BP 126/62 | HR 66 | Temp 97.5°F

## 2014-11-07 DIAGNOSIS — Z5111 Encounter for antineoplastic chemotherapy: Secondary | ICD-10-CM | POA: Diagnosis not present

## 2014-11-07 DIAGNOSIS — C3492 Malignant neoplasm of unspecified part of left bronchus or lung: Secondary | ICD-10-CM

## 2014-11-07 DIAGNOSIS — C3432 Malignant neoplasm of lower lobe, left bronchus or lung: Secondary | ICD-10-CM

## 2014-11-07 LAB — COMPREHENSIVE METABOLIC PANEL (CC13)
ALBUMIN: 3.5 g/dL (ref 3.5–5.0)
ALT: 19 U/L (ref 0–55)
AST: 36 U/L — ABNORMAL HIGH (ref 5–34)
Alkaline Phosphatase: 71 U/L (ref 40–150)
Anion Gap: 11 mEq/L (ref 3–11)
BILIRUBIN TOTAL: 0.45 mg/dL (ref 0.20–1.20)
BUN: 21.9 mg/dL (ref 7.0–26.0)
CO2: 23 meq/L (ref 22–29)
Calcium: 8.8 mg/dL (ref 8.4–10.4)
Chloride: 107 mEq/L (ref 98–109)
Creatinine: 1 mg/dL (ref 0.7–1.3)
EGFR: 74 mL/min/{1.73_m2} — AB (ref >90)
GLUCOSE: 84 mg/dL (ref 70–140)
Potassium: 4.1 mEq/L (ref 3.5–5.1)
Sodium: 142 mEq/L (ref 136–145)
TOTAL PROTEIN: 6.4 g/dL (ref 6.4–8.3)

## 2014-11-07 LAB — CBC WITH DIFFERENTIAL/PLATELET
BASO%: 1.1 % (ref 0.0–2.0)
Basophils Absolute: 0 10*3/uL (ref 0.0–0.1)
EOS%: 1.9 % (ref 0.0–7.0)
Eosinophils Absolute: 0.1 10*3/uL (ref 0.0–0.5)
HCT: 32 % — ABNORMAL LOW (ref 38.4–49.9)
HGB: 10.6 g/dL — ABNORMAL LOW (ref 13.0–17.1)
LYMPH#: 1.3 10*3/uL (ref 0.9–3.3)
LYMPH%: 48.3 % (ref 14.0–49.0)
MCH: 30.6 pg (ref 27.2–33.4)
MCHC: 33.1 g/dL (ref 32.0–36.0)
MCV: 92.5 fL (ref 79.3–98.0)
MONO#: 0.2 10*3/uL (ref 0.1–0.9)
MONO%: 7.1 % (ref 0.0–14.0)
NEUT#: 1.1 10*3/uL — ABNORMAL LOW (ref 1.5–6.5)
NEUT%: 41.6 % (ref 39.0–75.0)
Platelets: 164 10*3/uL (ref 140–400)
RBC: 3.46 10*6/uL — ABNORMAL LOW (ref 4.20–5.82)
RDW: 17 % — ABNORMAL HIGH (ref 11.0–14.6)
WBC: 2.7 10*3/uL — ABNORMAL LOW (ref 4.0–10.3)

## 2014-11-07 MED ORDER — SODIUM CHLORIDE 0.9 % IV SOLN
800.0000 mg/m2 | Freq: Once | INTRAVENOUS | Status: AC
  Administered 2014-11-07: 1710 mg via INTRAVENOUS
  Filled 2014-11-07: qty 44.97

## 2014-11-07 MED ORDER — SODIUM CHLORIDE 0.9 % IV SOLN
Freq: Once | INTRAVENOUS | Status: AC
  Administered 2014-11-07: 12:00:00 via INTRAVENOUS

## 2014-11-07 MED ORDER — PROCHLORPERAZINE MALEATE 10 MG PO TABS
ORAL_TABLET | ORAL | Status: AC
  Filled 2014-11-07: qty 1

## 2014-11-07 MED ORDER — PROCHLORPERAZINE MALEATE 10 MG PO TABS
10.0000 mg | ORAL_TABLET | Freq: Once | ORAL | Status: AC
  Administered 2014-11-07: 10 mg via ORAL

## 2014-11-07 NOTE — Progress Notes (Signed)
Ok to treat with ANC of 1.1 and WBC 2.7  Per Dr Julien Nordmann.

## 2014-11-07 NOTE — Patient Instructions (Signed)
[facility] Discharge Instructions for Patients Receiving Chemotherapy  Today you received the following chemotherapy agents gemzar  To help prevent nausea and vomiting after your treatment, we encourage you to take your nausea medication as directed.  If you develop nausea and vomiting that is not controlled by your nausea medication, call the clinic.   BELOW ARE SYMPTOMS THAT SHOULD BE REPORTED IMMEDIATELY:  *FEVER GREATER THAN 100.5 F  *CHILLS WITH OR WITHOUT FEVER  NAUSEA AND VOMITING THAT IS NOT CONTROLLED WITH YOUR NAUSEA MEDICATION  *UNUSUAL SHORTNESS OF BREATH  *UNUSUAL BRUISING OR BLEEDING  TENDERNESS IN MOUTH AND THROAT WITH OR WITHOUT PRESENCE OF ULCERS  *URINARY PROBLEMS  *BOWEL PROBLEMS  UNUSUAL RASH Items with * indicate a potential emergency and should be followed up as soon as possible.  Feel free to call the clinic you have any questions or concerns. The clinic phone number is [phone].  

## 2014-11-13 ENCOUNTER — Ambulatory Visit (INDEPENDENT_AMBULATORY_CARE_PROVIDER_SITE_OTHER): Payer: Medicare Other | Admitting: Cardiology

## 2014-11-13 ENCOUNTER — Encounter: Payer: Self-pay | Admitting: Cardiology

## 2014-11-13 VITALS — BP 82/40 | HR 74 | Ht 74.0 in | Wt 198.1 lb

## 2014-11-13 DIAGNOSIS — I2510 Atherosclerotic heart disease of native coronary artery without angina pectoris: Secondary | ICD-10-CM | POA: Diagnosis not present

## 2014-11-13 DIAGNOSIS — I10 Essential (primary) hypertension: Secondary | ICD-10-CM | POA: Diagnosis not present

## 2014-11-13 DIAGNOSIS — I951 Orthostatic hypotension: Secondary | ICD-10-CM | POA: Diagnosis not present

## 2014-11-13 DIAGNOSIS — E785 Hyperlipidemia, unspecified: Secondary | ICD-10-CM

## 2014-11-13 DIAGNOSIS — Z9861 Coronary angioplasty status: Secondary | ICD-10-CM

## 2014-11-13 MED ORDER — TELMISARTAN-HCTZ 40-12.5 MG PO TABS: 0.5500 | ORAL_TABLET | Freq: Every day | ORAL | Status: DC

## 2014-11-13 NOTE — Progress Notes (Signed)
PCP: [name], MD  Clinic Note: Chief Complaint  Patient presents with  . Shortness of Breath    when he stands up  . Dizziness    just when standing up, started saturday    HPI: Jeremy Deleon is a 81 y.o. male with a PMH below who presents today for initial post-op visit s/p VATS on Aug 10 2014.  Now taking Chemotherapy to improve odds of complete clearance. Nearing end of Rx.  Due to have labs tomorrow - may have 2-3 more treatments.  Past Medical History  Diagnosis Date  . CAD S/P percutaneous coronary angioplasty January 2006    Abnormal Myoview with inferolateral ischemia: --> 80% prox LAD lesion PCI: Taxus DES 3.0 mm x 16 mm-;; Myoview June 2009 no ischemia or infarction with attenuation artifact  . Hypertension   . Dyslipidemia, goal LDL below 70     on simvastatin and tricor  . Osteoarthritis   . BPH (benign prostatic hyperplasia)   . Squamous cell lung cancer : Left lower lobe  08/03/2014    Bronchoscopy showed extrinsic compression of the left lower lobe with endobronchial biopsies were negative. He subsequently had a needle biopsy which diagnosed the mass as squamous cell carcinoma   . GERD (gastroesophageal reflux disease)     Prior Cardiac Evaluation and Past Surgical History: Past Surgical History  Procedure Laterality Date  . Nm myoview ltd  07/2004    inferolatera wall ischemia  . Nm myocar perf wall motion  January 06, 2008     treadmill  myoview - tissue attenuation but no ischemia or infarction  . Doppler echocardiography  June 18 ,2009    mild concentric LVH, NORMAL ef, MILDLY  DILATED LEFT ATRIUM  AND MILD SCLEROTIC  AORTIC  VALVE AND OTHERWISE  NORMAL  . Cardiac catheterization  07/2004    80% proximal LAD lesion--PCI, EF 60%  . Percutaneous coronary stent intervention (pci-s)  2/27/ 2006    TAXUS DES 3.0 mm x 16 mm stent ->  PROX  LAD  . Renal doppler  05/03/2007    rgt and lft renal arteries normal patency; rgt and lft kidneys normal size  .  Umbilical hernia repair    . Appendectomy    . Cataract extraction    . Video assisted thoracoscopy (vats)/ lobectomy Left 08/10/2014    Procedure: VIDEO ASSISTED THORACOSCOPY (VATS)/ LOBECTOMY;  Surgeon: Ivin Poot, MD;  Location: [facility];  Service: Thoracic;  Laterality: Left;  . Video bronchoscopy N/A 08/10/2014    Procedure: VIDEO BRONCHOSCOPY;  Surgeon: Ivin Poot, MD;  Location: [facility] OR;  Service: Thoracic;  Laterality: N/A;    Interval History: Was doing post-op (VATS Surgery) until this past Saturday - began feeling weak & dizzy.  BP on Saturday ~140/60. Has not been eating very much for evening meal most days.  Didn't eat much on Sunday - too weak to make it to the kitchen. No syncope, but has felt quite dizzy with standing.  Feels better once seated.  Still taking all of his meds He is still going through chemotherapy with at least 2-3 more runs left. He doesn't mention much later in the symptoms from that but then later describes poor by mouth intake and nausea.  In addition to feeling fatigued and dizzy, he also is noting exertional dyspnea and dyspnea with just even simply standing up. Does not describe PND orthopnea too much. He also has not really noted much leg edema. He denies chest  pain with rest or exertion.  No palpitations, TIA/amaurosis fugax symptoms. No melena, hematochezia, hematuria, or epstaxis. No claudication.  ROS: A comprehensive was performed. Review of Systems  Constitutional: Positive for weight loss (Not much) and malaise/fatigue.  HENT: Negative for congestion.   Respiratory: Positive for cough (couple times in AM). Negative for shortness of breath and wheezing.   Cardiovascular: Positive for leg swelling. Negative for claudication.       Per HPI  Gastrointestinal: Positive for nausea (some with chemo). Negative for blood in stool and melena.       Decreased PO intake - mostly @ dinner  Genitourinary: Positive for frequency (multple x per night -  nocturia). Negative for hematuria.  Neurological: Positive for dizziness and weakness.  Endo/Heme/Allergies: Bruises/bleeds easily.  Psychiatric/Behavioral: Positive for depression.  All other systems reviewed and are negative.   Current Outpatient Prescriptions on File Prior to Visit  Medication Sig Dispense Refill  . acetaminophen (TYLENOL) 325 MG tablet Take 650 mg by mouth every 6 (six) hours as needed (pain).    Marland Kitchen aspirin EC 325 MG tablet Take 325 mg by mouth daily.    . cephALEXin (KEFLEX) 500 MG capsule Take 1 capsule (500 mg total) by mouth 4 (four) times daily. 40 capsule 0  . fenofibrate (TRICOR) 145 MG tablet TAKE 1 TABLET BY MOUTH EVERY DAY 90 tablet 1  . isosorbide mononitrate (IMDUR) 30 MG 24 hr tablet TAKE 1 TABLET BY MOUTH DAILY. 90 tablet 1  . lactulose (CHRONULAC) 10 GM/15ML solution Take 30 mLs (20 g total) by mouth daily as needed for mild constipation. 240 mL 1  . metoprolol succinate (TOPROL-XL) 25 MG 24 hr tablet TAKE 1 TABLET BY MOUTH DAILY. 90 tablet 1  . prochlorperazine (COMPAZINE) 10 MG tablet Take 1 tablet (10 mg total) by mouth every 6 (six) hours as needed for nausea or vomiting. 30 tablet 0  . simvastatin (ZOCOR) 40 MG tablet TAKE 1 TABLET BY MOUTH QDF 90 tablet 1  . traMADol (ULTRAM) 50 MG tablet Take 50 mg by mouth every 6 (six) hours as needed for moderate pain.     No current facility-administered medications on file prior to visit.    Allergies  Allergen Reactions  . Niaspan [Niacin Er] Other (See Comments)    flushing    History  Substance Use Topics  . Smoking status: Former Smoker    Types: Cigarettes    Quit date: 10/25/1982  . Smokeless tobacco: Not on file  . Alcohol Use: No   Family History  Problem Relation Age of Onset  . Cancer Mother   . Heart disease Mother   . Heart disease Father   . Parkinsonism Brother     Wt Readings from Last 3 Encounters:  11/13/14 198 lb 1.6 oz (89.858 kg)  10/24/14 204 lb (92.534 kg)  10/11/14  198 lb (89.812 kg)    PHYSICAL EXAM BP 82/40 mmHg  Pulse 74  Ht '6\' 2"'$  (1.88 m)  Wt 198 lb 1.6 oz (89.858 kg)  BMI 25.42 kg/m2 General appearance: alert, cooperative, appears stated age, appears a bit uncomfortable - queasy..  Looks a bit pale & weak.   Neck: no adenopathy, no carotid bruit and no JVD Lungs: clear to auscultation bilaterally, normal percussion bilaterally and non-labored Heart: regular rate and rhythm, S1, S2 normal, no murmur, click, rub or gallop ; nondisplaced  Abdomen: soft, non-tender; bowel sounds normal; no masses,  no organomegaly; Extremities: extremities normal, atraumatic, no cyanosis, and mild edema  Pulses: roughly 1+ and thready bilaterally Neurologic: Mental status: Alert, oriented, thought content appropriate Cranial nerves: normal (II-XII grossly intact)    Adult ECG Report  Rate: 74 ;  Rhythm: normal sinus rhythm,  All normal axis, intervals and durations. No notable ST-T wave changes to suggest ischemia.  Narrative Interpretation: essentially normal EKG  Recent Labs:   No results found for: CHOL, HDL, LDLCALC, [place] Grater, CHOLHDL   Chemistry      Component Value Date/Time   NA 140 11/14/2014 1017   NA 140 08/14/2014 0255   K 3.7 11/14/2014 1017   K 3.6 08/14/2014 0255   CL 105 08/14/2014 0255   CO2 25 11/14/2014 1017   CO2 29 08/14/2014 0255   BUN 21.5 11/14/2014 1017   BUN 19 08/14/2014 0255   CREATININE 1.2 11/14/2014 1017   CREATININE 0.96 08/14/2014 0255      Component Value Date/Time   CALCIUM 8.6 11/14/2014 1017   CALCIUM 8.6 08/14/2014 0255   ALKPHOS 76 11/14/2014 1017   ALKPHOS 49 08/14/2014 0255   AST 56* 11/14/2014 1017   AST 20 08/14/2014 0255   ALT 41 11/14/2014 1017   ALT 11 08/14/2014 0255   BILITOT 0.34 11/14/2014 1017   BILITOT 0.9 08/14/2014 0255      ASSESSMENT / PLAN: Problem List Items Addressed This Visit    CAD S/P percutaneous coronary angioplasty -- Taxus DES in Prox LAD - Primary (Chronic)     No active angina symptoms. On aspirin for antiplatelet. Without active symptoms we can stop Imdur. Otherwise on beta blocker and ARB. When he is not feeling poorly from his XRT and chemotherapy, he is usually relatively active and has denied any anginal type symptoms.      Relevant Medications   telmisartan-hydrochlorothiazide (MICARDIS HCT) 40-12.5 MG per tablet   Other Relevant Orders   EKG 12-Lead (Completed)   Dyslipidemia, goal LDL below 70 (Chronic)    He is on statin plus fenofibrate. I am reluctant 2 titrate medications further. He did need to monitor for myalgias with a combination. Also need to closely monitor LFTs. These are being monitored by her PCP.      Relevant Medications   telmisartan-hydrochlorothiazide (MICARDIS HCT) 40-12.5 MG per tablet   Other Relevant Orders   EKG 12-Lead (Completed)   Essential hypertension (Chronic)    Now is on Toprol, he Toprol we can stop the Imdur and cut back on the ARB dose as noted above. Following for mild permissive hypertension      Relevant Medications   telmisartan-hydrochlorothiazide (MICARDIS HCT) 40-12.5 MG per tablet   Other Relevant Orders   EKG 12-Lead (Completed)   Orthostatic hypotension; with baseline hypotension (Chronic)    His blood pressure on initial check today was 84/40. On my recheck it was 94/60 sitting down. Regardless, this is still too low. I do wonder with his poor by mouth intake he may very well have some mild dehydration. Plan: With no active angina we can discontinue Imdur. Hold ARB/HCTZ dose tomorrow, and then start back at half a tablet daily.  Allow for permissive hypertension necessary provided he has no heart failure symptoms.      Relevant Medications   telmisartan-hydrochlorothiazide (MICARDIS HCT) 40-12.5 MG per tablet   Other Relevant Orders   EKG 12-Lead (Completed)      Followup:  2 months     HARDING, Leonie Green, M.D., M.S. Interventional Cardiologist   Pager # [phone]

## 2014-11-13 NOTE — Patient Instructions (Signed)
TOMORROW - DO NOT TAKE MICARDIS  NEXT DAY START TAKING 1/2 TABLET OF MICARDIS.   STOP TAKING IMDUR ( ISOSORBIDE MN)  Your physician wants you to follow-up in 2 MONTHS DR HARDING. [place]. You will receive a reminder letter in the mail two months in advance. If you don't receive a letter, please call our office to schedule the follow-up appointment.

## 2014-11-14 ENCOUNTER — Other Ambulatory Visit (HOSPITAL_BASED_OUTPATIENT_CLINIC_OR_DEPARTMENT_OTHER): Payer: Medicare Other

## 2014-11-14 DIAGNOSIS — C3492 Malignant neoplasm of unspecified part of left bronchus or lung: Secondary | ICD-10-CM

## 2014-11-14 DIAGNOSIS — C3432 Malignant neoplasm of lower lobe, left bronchus or lung: Secondary | ICD-10-CM

## 2014-11-14 LAB — CBC WITH DIFFERENTIAL/PLATELET
BASO%: 0 % (ref 0.0–2.0)
BASOS ABS: 0 10*3/uL (ref 0.0–0.1)
EOS%: 0.4 % (ref 0.0–7.0)
Eosinophils Absolute: 0 10*3/uL (ref 0.0–0.5)
HCT: 26.5 % — ABNORMAL LOW (ref 38.4–49.9)
HEMOGLOBIN: 9.1 g/dL — AB (ref 13.0–17.1)
LYMPH#: 1.1 10*3/uL (ref 0.9–3.3)
LYMPH%: 45.1 % (ref 14.0–49.0)
MCH: 30.8 pg (ref 27.2–33.4)
MCHC: 34.3 g/dL (ref 32.0–36.0)
MCV: 89.8 fL (ref 79.3–98.0)
MONO#: 0.1 10*3/uL (ref 0.1–0.9)
MONO%: 5.3 % (ref 0.0–14.0)
NEUT#: 1.2 10*3/uL — ABNORMAL LOW (ref 1.5–6.5)
NEUT%: 49.2 % (ref 39.0–75.0)
PLATELETS: 38 10*3/uL — AB (ref 140–400)
RBC: 2.95 10*6/uL — AB (ref 4.20–5.82)
RDW: 16.2 % — ABNORMAL HIGH (ref 11.0–14.6)
WBC: 2.4 10*3/uL — AB (ref 4.0–10.3)
nRBC: 0 % (ref 0–0)

## 2014-11-14 LAB — COMPREHENSIVE METABOLIC PANEL (CC13)
ALT: 41 U/L (ref 0–55)
AST: 56 U/L — ABNORMAL HIGH (ref 5–34)
Albumin: 3.4 g/dL — ABNORMAL LOW (ref 3.5–5.0)
Alkaline Phosphatase: 76 U/L (ref 40–150)
Anion Gap: 12 mEq/L — ABNORMAL HIGH (ref 3–11)
BILIRUBIN TOTAL: 0.34 mg/dL (ref 0.20–1.20)
BUN: 21.5 mg/dL (ref 7.0–26.0)
CALCIUM: 8.6 mg/dL (ref 8.4–10.4)
CHLORIDE: 103 meq/L (ref 98–109)
CO2: 25 meq/L (ref 22–29)
Creatinine: 1.2 mg/dL (ref 0.7–1.3)
EGFR: 56 mL/min/{1.73_m2} — ABNORMAL LOW (ref >90)
Glucose: 90 mg/dl (ref 70–140)
POTASSIUM: 3.7 meq/L (ref 3.5–5.1)
SODIUM: 140 meq/L (ref 136–145)
Total Protein: 5.8 g/dL — ABNORMAL LOW (ref 6.4–8.3)

## 2014-11-15 NOTE — Assessment & Plan Note (Signed)
Now is on Toprol, he Toprol we can stop the Imdur and cut back on the ARB dose as noted above. Following for mild permissive hypertension

## 2014-11-15 NOTE — Assessment & Plan Note (Signed)
He is on statin plus fenofibrate. I am reluctant 2 titrate medications further. He did need to monitor for myalgias with a combination. Also need to closely monitor LFTs. These are being monitored by her PCP.

## 2014-11-15 NOTE — Assessment & Plan Note (Signed)
No active angina symptoms. On aspirin for antiplatelet. Without active symptoms we can stop Imdur. Otherwise on beta blocker and ARB. When he is not feeling poorly from his XRT and chemotherapy, he is usually relatively active and has denied any anginal type symptoms.

## 2014-11-15 NOTE — Assessment & Plan Note (Signed)
His blood pressure on initial check today was 84/40. On my recheck it was 94/60 sitting down. Regardless, this is still too low. I do wonder with his poor by mouth intake he may very well have some mild dehydration. Plan: With no active angina we can discontinue Imdur. Hold ARB/HCTZ dose tomorrow, and then start back at half a tablet daily.  Allow for permissive hypertension necessary provided he has no heart failure symptoms.

## 2014-11-21 ENCOUNTER — Other Ambulatory Visit (HOSPITAL_BASED_OUTPATIENT_CLINIC_OR_DEPARTMENT_OTHER): Payer: Medicare Other

## 2014-11-21 ENCOUNTER — Ambulatory Visit (HOSPITAL_BASED_OUTPATIENT_CLINIC_OR_DEPARTMENT_OTHER): Payer: Medicare Other

## 2014-11-21 ENCOUNTER — Ambulatory Visit (HOSPITAL_BASED_OUTPATIENT_CLINIC_OR_DEPARTMENT_OTHER): Payer: Medicare Other | Admitting: Nurse Practitioner

## 2014-11-21 ENCOUNTER — Telehealth: Payer: Self-pay | Admitting: Nurse Practitioner

## 2014-11-21 VITALS — BP 144/54 | HR 61 | Temp 97.5°F | Resp 18 | Ht 74.0 in | Wt 207.5 lb

## 2014-11-21 DIAGNOSIS — C3432 Malignant neoplasm of lower lobe, left bronchus or lung: Secondary | ICD-10-CM

## 2014-11-21 DIAGNOSIS — C3490 Malignant neoplasm of unspecified part of unspecified bronchus or lung: Secondary | ICD-10-CM

## 2014-11-21 DIAGNOSIS — Z5111 Encounter for antineoplastic chemotherapy: Secondary | ICD-10-CM

## 2014-11-21 DIAGNOSIS — C3492 Malignant neoplasm of unspecified part of left bronchus or lung: Secondary | ICD-10-CM

## 2014-11-21 LAB — CBC WITH DIFFERENTIAL/PLATELET
BASO%: 0.3 % (ref 0.0–2.0)
Basophils Absolute: 0 10*3/uL (ref 0.0–0.1)
EOS%: 1.7 % (ref 0.0–7.0)
Eosinophils Absolute: 0.1 10*3/uL (ref 0.0–0.5)
HEMATOCRIT: 28.5 % — AB (ref 38.4–49.9)
HGB: 9.6 g/dL — ABNORMAL LOW (ref 13.0–17.1)
LYMPH%: 22.1 % (ref 14.0–49.0)
MCH: 32 pg (ref 27.2–33.4)
MCHC: 33.7 g/dL (ref 32.0–36.0)
MCV: 95 fL (ref 79.3–98.0)
MONO#: 0.6 10*3/uL (ref 0.1–0.9)
MONO%: 18.4 % — ABNORMAL HIGH (ref 0.0–14.0)
NEUT#: 2 10*3/uL (ref 1.5–6.5)
NEUT%: 57.5 % (ref 39.0–75.0)
Platelets: 292 10*3/uL (ref 140–400)
RBC: 3 10*6/uL — ABNORMAL LOW (ref 4.20–5.82)
RDW: 19.6 % — ABNORMAL HIGH (ref 11.0–14.6)
WBC: 3.5 10*3/uL — ABNORMAL LOW (ref 4.0–10.3)
lymph#: 0.8 10*3/uL — ABNORMAL LOW (ref 0.9–3.3)
nRBC: 0 % (ref 0–0)

## 2014-11-21 LAB — COMPREHENSIVE METABOLIC PANEL (CC13)
ALBUMIN: 3.4 g/dL — AB (ref 3.5–5.0)
ALT: 16 U/L (ref 0–55)
AST: 26 U/L (ref 5–34)
Alkaline Phosphatase: 78 U/L (ref 40–150)
Anion Gap: 10 mEq/L (ref 3–11)
BUN: 16.9 mg/dL (ref 7.0–26.0)
CO2: 24 mEq/L (ref 22–29)
CREATININE: 1 mg/dL (ref 0.7–1.3)
Calcium: 8.3 mg/dL — ABNORMAL LOW (ref 8.4–10.4)
Chloride: 105 mEq/L (ref 98–109)
EGFR: 73 mL/min/{1.73_m2} — AB (ref >90)
GLUCOSE: 104 mg/dL (ref 70–140)
Potassium: 3.8 mEq/L (ref 3.5–5.1)
SODIUM: 138 meq/L (ref 136–145)
Total Bilirubin: 0.24 mg/dL (ref 0.20–1.20)
Total Protein: 6 g/dL — ABNORMAL LOW (ref 6.4–8.3)

## 2014-11-21 MED ORDER — SODIUM CHLORIDE 0.9 % IV SOLN
800.0000 mg/m2 | Freq: Once | INTRAVENOUS | Status: AC
  Administered 2014-11-21: 1710 mg via INTRAVENOUS
  Filled 2014-11-21: qty 44.97

## 2014-11-21 MED ORDER — CARBOPLATIN CHEMO INJECTION 450 MG/45ML
390.4000 mg | Freq: Once | INTRAVENOUS | Status: AC
  Administered 2014-11-21: 390 mg via INTRAVENOUS
  Filled 2014-11-21: qty 39

## 2014-11-21 MED ORDER — SODIUM CHLORIDE 0.9 % IV SOLN
Freq: Once | INTRAVENOUS | Status: AC
  Administered 2014-11-21: 15:00:00 via INTRAVENOUS

## 2014-11-21 MED ORDER — SODIUM CHLORIDE 0.9 % IV SOLN
Freq: Once | INTRAVENOUS | Status: AC
  Administered 2014-11-21: 16:00:00 via INTRAVENOUS
  Filled 2014-11-21: qty 8

## 2014-11-21 NOTE — Progress Notes (Signed)
  [facility] OFFICE PROGRESS NOTE   DIAGNOSIS: Stage IIA (T2b, N0, M0) non-small cell lung cancer consistent with squamous cell carcinoma diagnosed in January 2016  PRIOR THERAPY: Status post left VATS with left lower lobectomy and mediastinal lymph node dissection under the care of Dr. Prescott Gum on 08/10/2014.  CURRENT THERAPY: Adjuvant systemic chemotherapy with carboplatin for AUC of 5 on day 1 and gemcitabine 1000 MG/M2 on days 1 and 8 every 3 weeks. First dose 09/19/2014. Carboplatin AUC decreased to 4 and gemcitabine decreased to 800 mg/m beginning with cycle 2. Status post 3 cycles.   INTERVAL HISTORY:   Jeremy Deleon returns as scheduled. He completed cycle 3 carboplatin/gemcitabine beginning 10/31/2014. He has occasional mild nausea. No vomiting. No mouth sores. No diarrhea. He denies fever. No rash. Stable exertional dyspnea. No cough.  Objective:  Vital signs in last 24 hours:  Blood pressure 144/54, pulse 61, temperature 97.5 F (36.4 C), temperature source Oral, resp. rate 18, height '6\' 2"'$  (1.88 m), weight 207 lb 8 oz (94.121 kg), SpO2 100 %.    HEENT: No thrush or ulcers. Resp: Lungs clear bilaterally. Cardio: Regular rate and rhythm. GI: Abdomen soft and nontender. No hepatomegaly. Vascular: Trace edema lower legs bilaterally.   Lab Results:  Lab Results  Component Value Date   WBC 3.5* 11/21/2014   HGB 9.6* 11/21/2014   HCT 28.5* 11/21/2014   MCV 95.0 11/21/2014   PLT 292 11/21/2014   NEUTROABS 2.0 11/21/2014    Imaging:  No results found.  Medications: I have reviewed the patient's current medications.  Assessment/Plan: 1. Stage IIa non-small cell lung cancer, squamous cell carcinoma status post left lower lobectomy with lymph node dissection currently undergoing adjuvant chemotherapy with carboplatin and gemcitabine status post 3 cycles.   Disposition: Jeremy Deleon appears stable. He has completed 3 cycles of adjuvant  carboplatin/gemcitabine. Plan to proceed with the fourth and final cycle today as scheduled. Dr. Julien Nordmann recommends a chest CT in approximately one month with a follow-up visit a few days later to review the results. Mr. Coaxum will contact the office prior to his next visit with any problems.  Plan reviewed with Dr. Julien Nordmann.    Ned Card ANP/GNP-BC   11/21/2014  1:52 PM

## 2014-11-21 NOTE — Patient Instructions (Signed)
[facility] Discharge Instructions for Patients Receiving Chemotherapy  Today you received the following chemotherapy agents: Gemzar and Carboplatin   To help prevent nausea and vomiting after your treatment, we encourage you to take your nausea medication as directed.    If you develop nausea and vomiting that is not controlled by your nausea medication, call the clinic.   BELOW ARE SYMPTOMS THAT SHOULD BE REPORTED IMMEDIATELY:  *FEVER GREATER THAN 100.5 F  *CHILLS WITH OR WITHOUT FEVER  NAUSEA AND VOMITING THAT IS NOT CONTROLLED WITH YOUR NAUSEA MEDICATION  *UNUSUAL SHORTNESS OF BREATH  *UNUSUAL BRUISING OR BLEEDING  TENDERNESS IN MOUTH AND THROAT WITH OR WITHOUT PRESENCE OF ULCERS  *URINARY PROBLEMS  *BOWEL PROBLEMS  UNUSUAL RASH Items with * indicate a potential emergency and should be followed up as soon as possible.  Feel free to call the clinic you have any questions or concerns. The clinic phone number is [phone].  Please show the CHEMO ALERT CARD at check-in to the Emergency Department and triage nurse.   

## 2014-11-21 NOTE — Telephone Encounter (Signed)
per pof ot sch pt appt-cld & spoke to pt & adv of appt times-pt stated will get updated sch on 5/10

## 2014-11-28 ENCOUNTER — Other Ambulatory Visit (HOSPITAL_BASED_OUTPATIENT_CLINIC_OR_DEPARTMENT_OTHER): Payer: Medicare Other

## 2014-11-28 ENCOUNTER — Ambulatory Visit: Payer: Medicare Other

## 2014-11-28 DIAGNOSIS — C3432 Malignant neoplasm of lower lobe, left bronchus or lung: Secondary | ICD-10-CM

## 2014-11-28 DIAGNOSIS — C3490 Malignant neoplasm of unspecified part of unspecified bronchus or lung: Secondary | ICD-10-CM

## 2014-11-28 LAB — CBC WITH DIFFERENTIAL/PLATELET
BASO%: 2.8 % — AB (ref 0.0–2.0)
BASOS ABS: 0 10*3/uL (ref 0.0–0.1)
EOS%: 2.1 % (ref 0.0–7.0)
Eosinophils Absolute: 0 10*3/uL (ref 0.0–0.5)
HEMATOCRIT: 29.3 % — AB (ref 38.4–49.9)
HEMOGLOBIN: 9.7 g/dL — AB (ref 13.0–17.1)
LYMPH%: 65.5 % — AB (ref 14.0–49.0)
MCH: 31.2 pg (ref 27.2–33.4)
MCHC: 33.1 g/dL (ref 32.0–36.0)
MCV: 94.2 fL (ref 79.3–98.0)
MONO#: 0.1 10*3/uL (ref 0.1–0.9)
MONO%: 6.3 % (ref 0.0–14.0)
NEUT#: 0.3 10*3/uL — CL (ref 1.5–6.5)
NEUT%: 23.3 % — AB (ref 39.0–75.0)
PLATELETS: 384 10*3/uL (ref 140–400)
RBC: 3.11 10*6/uL — ABNORMAL LOW (ref 4.20–5.82)
RDW: 18.9 % — ABNORMAL HIGH (ref 11.0–14.6)
WBC: 1.4 10*3/uL — ABNORMAL LOW (ref 4.0–10.3)
lymph#: 0.9 10*3/uL (ref 0.9–3.3)
nRBC: 0 % (ref 0–0)

## 2014-11-28 LAB — COMPREHENSIVE METABOLIC PANEL (CC13)
ALBUMIN: 3.6 g/dL (ref 3.5–5.0)
ALT: 42 U/L (ref 0–55)
ANION GAP: 12 meq/L — AB (ref 3–11)
AST: 66 U/L — AB (ref 5–34)
Alkaline Phosphatase: 82 U/L (ref 40–150)
BUN: 19.6 mg/dL (ref 7.0–26.0)
CALCIUM: 9 mg/dL (ref 8.4–10.4)
CHLORIDE: 107 meq/L (ref 98–109)
CO2: 25 meq/L (ref 22–29)
CREATININE: 1.2 mg/dL (ref 0.7–1.3)
EGFR: 58 mL/min/{1.73_m2} — ABNORMAL LOW (ref >90)
Glucose: 81 mg/dl (ref 70–140)
POTASSIUM: 3.9 meq/L (ref 3.5–5.1)
Sodium: 144 mEq/L (ref 136–145)
Total Bilirubin: 0.39 mg/dL (ref 0.20–1.20)
Total Protein: 6.3 g/dL — ABNORMAL LOW (ref 6.4–8.3)

## 2014-11-28 LAB — TECHNOLOGIST REVIEW

## 2014-11-28 NOTE — Progress Notes (Signed)
Per Dr. Julien Nordmann, pt to receive no treatment today for ANC 0.3.  Pt verbalized understanding.  Follow up appointments discussed and pt instructed to watch for signs of infection.

## 2014-12-01 ENCOUNTER — Telehealth: Payer: Self-pay | Admitting: Internal Medicine

## 2014-12-01 NOTE — Telephone Encounter (Signed)
returned pt call and s.w pt and confirm appts

## 2014-12-05 ENCOUNTER — Other Ambulatory Visit: Payer: Self-pay | Admitting: Medical Oncology

## 2014-12-05 ENCOUNTER — Other Ambulatory Visit (HOSPITAL_BASED_OUTPATIENT_CLINIC_OR_DEPARTMENT_OTHER): Payer: Medicare Other

## 2014-12-05 DIAGNOSIS — C3490 Malignant neoplasm of unspecified part of unspecified bronchus or lung: Secondary | ICD-10-CM

## 2014-12-05 DIAGNOSIS — C3432 Malignant neoplasm of lower lobe, left bronchus or lung: Secondary | ICD-10-CM

## 2014-12-05 LAB — COMPREHENSIVE METABOLIC PANEL (CC13)
ALBUMIN: 3.4 g/dL — AB (ref 3.5–5.0)
ALT: 19 U/L (ref 0–55)
ANION GAP: 10 meq/L (ref 3–11)
AST: 40 U/L — ABNORMAL HIGH (ref 5–34)
Alkaline Phosphatase: 71 U/L (ref 40–150)
BUN: 16.6 mg/dL (ref 7.0–26.0)
CALCIUM: 8.7 mg/dL (ref 8.4–10.4)
CO2: 28 meq/L (ref 22–29)
CREATININE: 1.1 mg/dL (ref 0.7–1.3)
Chloride: 107 mEq/L (ref 98–109)
EGFR: 60 mL/min/{1.73_m2} — AB (ref >90)
GLUCOSE: 84 mg/dL (ref 70–140)
Potassium: 4.1 mEq/L (ref 3.5–5.1)
SODIUM: 145 meq/L (ref 136–145)
TOTAL PROTEIN: 6 g/dL — AB (ref 6.4–8.3)
Total Bilirubin: 0.31 mg/dL (ref 0.20–1.20)

## 2014-12-05 LAB — CBC WITH DIFFERENTIAL/PLATELET
BASO%: 0.4 % (ref 0.0–2.0)
Basophils Absolute: 0 10*3/uL (ref 0.0–0.1)
EOS ABS: 0 10*3/uL (ref 0.0–0.5)
EOS%: 0.6 % (ref 0.0–7.0)
HCT: 29.6 % — ABNORMAL LOW (ref 38.4–49.9)
HGB: 9.8 g/dL — ABNORMAL LOW (ref 13.0–17.1)
LYMPH%: 25 % (ref 14.0–49.0)
MCH: 32.3 pg (ref 27.2–33.4)
MCHC: 33.2 g/dL (ref 32.0–36.0)
MCV: 97.6 fL (ref 79.3–98.0)
MONO#: 1.1 10*3/uL — ABNORMAL HIGH (ref 0.1–0.9)
MONO%: 24.7 % — AB (ref 0.0–14.0)
NEUT%: 49.3 % (ref 39.0–75.0)
NEUTROS ABS: 2.2 10*3/uL (ref 1.5–6.5)
PLATELETS: 128 10*3/uL — AB (ref 140–400)
RBC: 3.04 10*6/uL — AB (ref 4.20–5.82)
RDW: 22.9 % — ABNORMAL HIGH (ref 11.0–14.6)
WBC: 4.5 10*3/uL (ref 4.0–10.3)
lymph#: 1.1 10*3/uL (ref 0.9–3.3)

## 2014-12-08 ENCOUNTER — Telehealth: Payer: Self-pay | Admitting: *Deleted

## 2014-12-08 NOTE — Telephone Encounter (Signed)
Oncology Nurse Navigator Documentation  Oncology Nurse Navigator Flowsheets 12/08/2014  Navigator Encounter Type 3 month  Patient Visit Type Follow-up.  I called Jeremy Deleon today to follow up on how he was doing.  He stated he was fine.  I asked if he was feeling better since his blood counts had dropped.  He stated yes.  No barriers identified at this time.   Barriers/Navigation Needs No barriers at this time  Interventions None required  Support Groups/Services Friends and Family  Time Spent with Patient 15

## 2014-12-11 ENCOUNTER — Other Ambulatory Visit: Payer: Self-pay | Admitting: *Deleted

## 2014-12-11 DIAGNOSIS — C3492 Malignant neoplasm of unspecified part of left bronchus or lung: Secondary | ICD-10-CM

## 2014-12-12 ENCOUNTER — Other Ambulatory Visit (HOSPITAL_BASED_OUTPATIENT_CLINIC_OR_DEPARTMENT_OTHER): Payer: Medicare Other

## 2014-12-12 DIAGNOSIS — C3432 Malignant neoplasm of lower lobe, left bronchus or lung: Secondary | ICD-10-CM | POA: Diagnosis not present

## 2014-12-12 DIAGNOSIS — C3492 Malignant neoplasm of unspecified part of left bronchus or lung: Secondary | ICD-10-CM

## 2014-12-12 LAB — COMPREHENSIVE METABOLIC PANEL (CC13)
ALT: 10 U/L (ref 0–55)
AST: 30 U/L (ref 5–34)
Albumin: 3.5 g/dL (ref 3.5–5.0)
Alkaline Phosphatase: 76 U/L (ref 40–150)
Anion Gap: 11 mEq/L (ref 3–11)
BILIRUBIN TOTAL: 0.45 mg/dL (ref 0.20–1.20)
BUN: 15.4 mg/dL (ref 7.0–26.0)
CALCIUM: 8.3 mg/dL — AB (ref 8.4–10.4)
CHLORIDE: 106 meq/L (ref 98–109)
CO2: 27 meq/L (ref 22–29)
CREATININE: 1.1 mg/dL (ref 0.7–1.3)
EGFR: 60 mL/min/{1.73_m2} — ABNORMAL LOW (ref >90)
GLUCOSE: 81 mg/dL (ref 70–140)
Potassium: 4.1 mEq/L (ref 3.5–5.1)
Sodium: 143 mEq/L (ref 136–145)
TOTAL PROTEIN: 6.3 g/dL — AB (ref 6.4–8.3)

## 2014-12-19 ENCOUNTER — Ambulatory Visit (HOSPITAL_COMMUNITY)
Admission: RE | Admit: 2014-12-19 | Discharge: 2014-12-19 | Disposition: A | Discharge status: Home / Self Care | Payer: Medicare Other | Source: Ambulatory Visit | Attending: Nurse Practitioner | Admitting: Nurse Practitioner

## 2014-12-19 ENCOUNTER — Other Ambulatory Visit: Payer: Medicare Other

## 2014-12-19 ENCOUNTER — Encounter (HOSPITAL_COMMUNITY): Payer: Self-pay

## 2014-12-19 DIAGNOSIS — R0602 Shortness of breath: Secondary | ICD-10-CM | POA: Insufficient documentation

## 2014-12-19 DIAGNOSIS — Z85118 Personal history of other malignant neoplasm of bronchus and lung: Secondary | ICD-10-CM | POA: Insufficient documentation

## 2014-12-19 DIAGNOSIS — C3490 Malignant neoplasm of unspecified part of unspecified bronchus or lung: Secondary | ICD-10-CM

## 2014-12-19 DIAGNOSIS — Z9221 Personal history of antineoplastic chemotherapy: Secondary | ICD-10-CM | POA: Diagnosis not present

## 2014-12-19 MED ORDER — IOHEXOL 300 MG/ML  SOLN
100.0000 mL | Freq: Once | INTRAMUSCULAR | Status: AC | PRN
  Administered 2014-12-19: 80 mL via INTRAVENOUS

## 2014-12-20 ENCOUNTER — Other Ambulatory Visit: Payer: Self-pay | Admitting: Medical Oncology

## 2014-12-20 DIAGNOSIS — C3490 Malignant neoplasm of unspecified part of unspecified bronchus or lung: Secondary | ICD-10-CM

## 2014-12-21 ENCOUNTER — Ambulatory Visit (HOSPITAL_BASED_OUTPATIENT_CLINIC_OR_DEPARTMENT_OTHER): Payer: Medicare Other | Admitting: Internal Medicine

## 2014-12-21 ENCOUNTER — Other Ambulatory Visit: Payer: Self-pay | Admitting: *Deleted

## 2014-12-21 ENCOUNTER — Encounter: Payer: Self-pay | Admitting: Internal Medicine

## 2014-12-21 ENCOUNTER — Telehealth: Payer: Self-pay | Admitting: Internal Medicine

## 2014-12-21 ENCOUNTER — Other Ambulatory Visit (HOSPITAL_BASED_OUTPATIENT_CLINIC_OR_DEPARTMENT_OTHER): Payer: Medicare Other

## 2014-12-21 VITALS — BP 138/57 | HR 63 | Temp 97.5°F | Resp 18 | Ht 74.0 in | Wt 205.1 lb

## 2014-12-21 DIAGNOSIS — C3432 Malignant neoplasm of lower lobe, left bronchus or lung: Secondary | ICD-10-CM

## 2014-12-21 DIAGNOSIS — C3490 Malignant neoplasm of unspecified part of unspecified bronchus or lung: Secondary | ICD-10-CM

## 2014-12-21 DIAGNOSIS — C3492 Malignant neoplasm of unspecified part of left bronchus or lung: Secondary | ICD-10-CM

## 2014-12-21 LAB — CBC WITH DIFFERENTIAL/PLATELET
BASO%: 0.9 % (ref 0.0–2.0)
Basophils Absolute: 0.1 10*3/uL (ref 0.0–0.1)
EOS ABS: 0.1 10*3/uL (ref 0.0–0.5)
EOS%: 1.8 % (ref 0.0–7.0)
HEMATOCRIT: 35.4 % — AB (ref 38.4–49.9)
HGB: 11.5 g/dL — ABNORMAL LOW (ref 13.0–17.1)
LYMPH%: 29.3 % (ref 14.0–49.0)
MCH: 32.1 pg (ref 27.2–33.4)
MCHC: 32.5 g/dL (ref 32.0–36.0)
MCV: 98.9 fL — ABNORMAL HIGH (ref 79.3–98.0)
MONO#: 0.8 10*3/uL (ref 0.1–0.9)
MONO%: 14.4 % — AB (ref 0.0–14.0)
NEUT#: 3 10*3/uL (ref 1.5–6.5)
NEUT%: 53.6 % (ref 39.0–75.0)
Platelets: 141 10*3/uL (ref 140–400)
RBC: 3.58 10*6/uL — ABNORMAL LOW (ref 4.20–5.82)
RDW: 17.8 % — AB (ref 11.0–14.6)
WBC: 5.6 10*3/uL (ref 4.0–10.3)
lymph#: 1.6 10*3/uL (ref 0.9–3.3)

## 2014-12-21 LAB — COMPREHENSIVE METABOLIC PANEL (CC13)
ALK PHOS: 68 U/L (ref 40–150)
ALT: 9 U/L (ref 0–55)
AST: 23 U/L (ref 5–34)
Albumin: 3.6 g/dL (ref 3.5–5.0)
Anion Gap: 7 mEq/L (ref 3–11)
BUN: 18 mg/dL (ref 7.0–26.0)
CALCIUM: 9 mg/dL (ref 8.4–10.4)
CO2: 29 mEq/L (ref 22–29)
Chloride: 107 mEq/L (ref 98–109)
Creatinine: 1.2 mg/dL (ref 0.7–1.3)
EGFR: 57 mL/min/{1.73_m2} — AB (ref >90)
Glucose: 85 mg/dl (ref 70–140)
POTASSIUM: 4.2 meq/L (ref 3.5–5.1)
Sodium: 142 mEq/L (ref 136–145)
Total Bilirubin: 0.56 mg/dL (ref 0.20–1.20)
Total Protein: 6.3 g/dL — ABNORMAL LOW (ref 6.4–8.3)

## 2014-12-21 NOTE — Progress Notes (Signed)
[place] Telephone:([phone]) [phone]   Fax:(336) [phone]  OFFICE PROGRESS NOTE  [name], MD [place] [address]  DIAGNOSIS: Stage IIA (T2b, N0, M0) non-small cell lung cancer consistent with squamous cell carcinoma diagnosed in January 2016  PRIOR THERAPY:  1) Status post left VATS with left lower lobectomy and mediastinal lymph node dissection under the care of Dr. Prescott Gum on 08/10/2014. 2) Adjuvant systemic chemotherapy with carboplatin for AUC of 5 on day 1 and gemcitabine 1000 MG/M2 on days 1 and 8 every 3 weeks. First dose 09/19/2014.  CURRENT THERAPY: None.  INTERVAL HISTORY: Jeremy Deleon 81 y.o. male returns to the clinic today for follow-up visit. The patient is feeling fine today with no specific complaints. He tolerated the last cycle of his treatment fairly well. He denied having any significant fever or chills, no nausea or vomiting. The patient denied having any significant weight loss or night sweats. He has no chest pain, shortness of breath, cough or hemoptysis. He had repeat CT scan of the chest performed recently and he is here for evaluation and discussion of his scan results.  MEDICAL HISTORY: Past Medical History  Diagnosis Date  . CAD S/P percutaneous coronary angioplasty January 2006    Abnormal Myoview with inferolateral ischemia: --> 80% prox LAD lesion PCI: Taxus DES 3.0 mm x 16 mm-;; Myoview June 2009 no ischemia or infarction with attenuation artifact  . Hypertension   . Dyslipidemia, goal LDL below 70     on simvastatin and tricor  . Osteoarthritis   . BPH (benign prostatic hyperplasia)   . Squamous cell lung cancer : Left lower lobe  08/03/2014    Bronchoscopy showed extrinsic compression of the left lower lobe with endobronchial biopsies were negative. He subsequently had a needle biopsy which diagnosed the mass as squamous cell carcinoma   . GERD (gastroesophageal reflux disease)      ALLERGIES:  is allergic to niaspan.  MEDICATIONS:  Current Outpatient Prescriptions  Medication Sig Dispense Refill  . acetaminophen (TYLENOL) 325 MG tablet Take 650 mg by mouth every 6 (six) hours as needed (pain).    Marland Kitchen aspirin EC 325 MG tablet Take 325 mg by mouth daily.    . isosorbide mononitrate (IMDUR) 30 MG 24 hr tablet TAKE 1 TABLET BY MOUTH DAILY. 90 tablet 1  . lactulose (CHRONULAC) 10 GM/15ML solution Take 30 mLs (20 g total) by mouth daily as needed for mild constipation. 240 mL 1  . metoprolol succinate (TOPROL-XL) 25 MG 24 hr tablet TAKE 1 TABLET BY MOUTH DAILY. 90 tablet 1  . prochlorperazine (COMPAZINE) 10 MG tablet Take 1 tablet (10 mg total) by mouth every 6 (six) hours as needed for nausea or vomiting. 30 tablet 0  . simvastatin (ZOCOR) 40 MG tablet TAKE 1 TABLET BY MOUTH QDF 90 tablet 1  . telmisartan-hydrochlorothiazide (MICARDIS HCT) 40-12.5 MG per tablet Take 0.5 tablets by mouth daily. 90 tablet 3  . traMADol (ULTRAM) 50 MG tablet Take 50 mg by mouth every 6 (six) hours as needed for moderate pain.     No current facility-administered medications for this visit.    SURGICAL HISTORY:  Past Surgical History  Procedure Laterality Date  . Nm myoview ltd  07/2004    inferolatera wall ischemia  . Nm myocar perf wall motion  January 06, 2008     treadmill  myoview - tissue attenuation but no ischemia or infarction  . Doppler  echocardiography  June 18 ,2009    mild concentric LVH, NORMAL ef, MILDLY  DILATED LEFT ATRIUM  AND MILD SCLEROTIC  AORTIC  VALVE AND OTHERWISE  NORMAL  . Cardiac catheterization  07/2004    80% proximal LAD lesion--PCI, EF 60%  . Percutaneous coronary stent intervention (pci-s)  2/27/ 2006    TAXUS DES 3.0 mm x 16 mm stent ->  PROX  LAD  . Renal doppler  05/03/2007    rgt and lft renal arteries normal patency; rgt and lft kidneys normal size  . Umbilical hernia repair    . Appendectomy    . Cataract extraction    . Video assisted  thoracoscopy (vats)/ lobectomy Left 08/10/2014    Procedure: VIDEO ASSISTED THORACOSCOPY (VATS)/ LOBECTOMY;  Surgeon: Ivin Poot, MD;  Location: [facility];  Service: Thoracic;  Laterality: Left;  . Video bronchoscopy N/A 08/10/2014    Procedure: VIDEO BRONCHOSCOPY;  Surgeon: Ivin Poot, MD;  Location: [facility] OR;  Service: Thoracic;  Laterality: N/A;    REVIEW OF SYSTEMS:  Constitutional: positive for fatigue Eyes: negative Ears, nose, mouth, throat, and face: negative Respiratory: negative Cardiovascular: negative Gastrointestinal: negative Genitourinary:negative Integument/breast: negative Hematologic/lymphatic: negative Musculoskeletal:negative Neurological: negative Behavioral/Psych: negative Endocrine: negative Allergic/Immunologic: negative   PHYSICAL EXAMINATION: General appearance: alert, cooperative and no distress Head: Normocephalic, without obvious abnormality, atraumatic Neck: no adenopathy, no JVD, supple, symmetrical, trachea midline and thyroid not enlarged, symmetric, no tenderness/mass/nodules Lymph nodes: Cervical, supraclavicular, and axillary nodes normal. Resp: clear to auscultation bilaterally Back: symmetric, no curvature. ROM normal. No CVA tenderness. Cardio: normal apical impulse GI: soft, non-tender; bowel sounds normal; no masses,  no organomegaly Extremities: extremities normal, atraumatic, no cyanosis or edema Neurologic: Alert and oriented X 3, normal strength and tone. Normal symmetric reflexes. Normal coordination and gait  ECOG PERFORMANCE STATUS: 1 - Symptomatic but completely ambulatory  Blood pressure 138/57, pulse 63, temperature 97.5 F (36.4 C), temperature source Oral, resp. rate 18, height '6\' 2"'$  (1.88 m), weight 205 lb 1.6 oz (93.033 kg), SpO2 100 %.  LABORATORY DATA: Lab Results  Component Value Date   WBC 5.6 12/21/2014   HGB 11.5* 12/21/2014   HCT 35.4* 12/21/2014   MCV 98.9* 12/21/2014   PLT 141 12/21/2014      Chemistry       Component Value Date/Time   NA 143 12/12/2014 1013   NA 140 08/14/2014 0255   K 4.1 12/12/2014 1013   K 3.6 08/14/2014 0255   CL 105 08/14/2014 0255   CO2 27 12/12/2014 1013   CO2 29 08/14/2014 0255   BUN 15.4 12/12/2014 1013   BUN 19 08/14/2014 0255   CREATININE 1.1 12/12/2014 1013   CREATININE 0.96 08/14/2014 0255      Component Value Date/Time   CALCIUM 8.3* 12/12/2014 1013   CALCIUM 8.6 08/14/2014 0255   ALKPHOS 76 12/12/2014 1013   ALKPHOS 49 08/14/2014 0255   AST 30 12/12/2014 1013   AST 20 08/14/2014 0255   ALT 10 12/12/2014 1013   ALT 11 08/14/2014 0255   BILITOT 0.45 12/12/2014 1013   BILITOT 0.9 08/14/2014 0255       RADIOGRAPHIC STUDIES: Ct Chest W Contrast  12/19/2014   CLINICAL DATA:  Occasional shortness of breath. Stage IIA non-small cell lung cancer diagnosed last year, status post chemotherapy and left lower lobectomy. Initial encounter.  EXAM: CT CHEST WITH CONTRAST  TECHNIQUE: Multidetector CT imaging of the chest was performed during intravenous contrast administration.  CONTRAST:  74m OMNIPAQUE  IOHEXOL 300 MG/ML  SOLN  COMPARISON:  Chest radiograph performed 10/11/2014  FINDINGS: No definite recurrent mass is seen. However, there is a small left pleural effusion, of uncertain significance, and mild medial scarring along the left upper lobe. The patient is status post left lower lobectomy. Scattered large calcified granulomata are seen in the periphery of the right lung, with underlying mild peripheral fibrotic change. No pneumothorax is seen.  Mild soft tissue stranding is noted inferior to the left hilum, with associated postoperative change. This may simply be postoperative in nature. No definite recurrent mass is identified at the mediastinum.  Diffuse coronary artery calcifications are seen. The mediastinum is otherwise unremarkable. No mediastinal lymphadenopathy is seen. No pericardial effusion is identified. Scattered calcification is noted along the  aortic arch. The great vessels are grossly unremarkable. The thyroid gland is within normal limits. No axillary lymphadenopathy is seen.  The visualized portions of the liver and the spleen are unremarkable in appearance. Scattered stones are seen dependently within the gallbladder. The gallbladder is otherwise unremarkable. The visualized portions of the pancreas, adrenal glands and stomach are grossly unremarkable. Nonspecific perinephric stranding is noted bilaterally. Mild scattered calcification is seen along the proximal abdominal aorta.  No acute osseous abnormalities are identified. Mild diffuse anterior bridging osteophytes are seen along the thoracic spine.  IMPRESSION: 1. No definite recurrent mass seen. 2. Small left pleural effusion, of uncertain significance, and mild medial scarring along the left upper lobe. Status post left lower lobectomy. Depending on the degree of clinical concern, diagnostic thoracentesis could be considered. 3. Mild soft tissue stranding inferior to the left hilum, with associated postoperative change. This may simply be postoperative in nature. No definite mediastinal mass seen. 4. Diffuse coronary artery calcifications seen. 5. Cholelithiasis; gallbladder otherwise unremarkable. 6. Mild scattered calcification along the aortic arch and proximal abdominal aorta.   Electronically Signed   By: Garald Balding M.D.   On: 12/19/2014 17:40    ASSESSMENT AND PLAN: This is a very pleasant 81 years old white male recently diagnosed with a stage IIA non-small cell lung cancer, squamous cell carcinoma status post left lower lobectomy with lymph node dissection. This was followed by 4 cycles of adjuvant chemotherapy with carboplatin and gemcitabine. The patient tolerated his treatment fairly well with no significant complaints except for mild fatigue. The recent CT scan of the chest showed no evidence for disease recurrence. I discussed the scan results with the patient today. I  recommended for him to continue on observation with repeat CT scan of the chest in 4 months. He was advised to call immediately if he has any concerning symptoms in the interval. The patient voices understanding of current disease status and treatment options and is in agreement with the current care plan.  All questions were answered. The patient knows to call the clinic with any problems, questions or concerns. We can certainly see the patient much sooner if necessary.  Disclaimer: This note was dictated with voice recognition software. Similar sounding words can inadvertently be transcribed and may not be corrected upon review.

## 2014-12-21 NOTE — Telephone Encounter (Signed)
per pof tos ch pt appt-adv pt that [facility] will call to sch scan-gave pt copy of sch

## 2015-01-15 ENCOUNTER — Other Ambulatory Visit: Payer: Medicare Other

## 2015-01-15 ENCOUNTER — Encounter: Payer: Self-pay | Admitting: Cardiothoracic Surgery

## 2015-01-15 ENCOUNTER — Ambulatory Visit: Payer: Medicare Other | Admitting: Cardiothoracic Surgery

## 2015-01-15 ENCOUNTER — Ambulatory Visit (INDEPENDENT_AMBULATORY_CARE_PROVIDER_SITE_OTHER): Payer: Medicare Other | Admitting: Cardiothoracic Surgery

## 2015-01-15 VITALS — BP 115/69 | HR 64 | Resp 16 | Ht 74.0 in | Wt 205.0 lb

## 2015-01-15 DIAGNOSIS — Z9889 Other specified postprocedural states: Secondary | ICD-10-CM | POA: Diagnosis not present

## 2015-01-15 DIAGNOSIS — C3432 Malignant neoplasm of lower lobe, left bronchus or lung: Secondary | ICD-10-CM | POA: Diagnosis not present

## 2015-01-15 DIAGNOSIS — Z902 Acquired absence of lung [part of]: Secondary | ICD-10-CM

## 2015-01-15 NOTE — Progress Notes (Signed)
PCP is [name], MD Referring Provider is Curt Bears, MD  Chief Complaint  Patient presents with  . Routine Post Op    3 month f/u with CT CHEST per [facility] 5/301/16    [id number] returns with CT scan of the chest for 6 month followup after left lower lobectomy and lymph node dissection for stage II a non-small cell carcinoma. The patient had postoperative adjuvant chemotherapy directed by Dr. Julien Nordmann. The patient is a nonsmoker. He has no pulmonary symptoms. He still has some mild postthoracotomy pain under the left breast line. He denies shortness of breath.  CT scan of the chest images personally and independently reviewed showing no evidence recurrent or new lung malignancy. These results were reviewed with the patient.   Past Medical History  Diagnosis Date  . CAD S/P percutaneous coronary angioplasty January 2006    Abnormal Myoview with inferolateral ischemia: --> 80% prox LAD lesion PCI: Taxus DES 3.0 mm x 16 mm-;; Myoview June 2009 no ischemia or infarction with attenuation artifact  . Hypertension   . Dyslipidemia, goal LDL below 70     on simvastatin and tricor  . Osteoarthritis   . BPH (benign prostatic hyperplasia)   . Squamous cell lung cancer : Left lower lobe  08/03/2014    Bronchoscopy showed extrinsic compression of the left lower lobe with endobronchial biopsies were negative. He subsequently had a needle biopsy which diagnosed the mass as squamous cell carcinoma   . GERD (gastroesophageal reflux disease)     Past Surgical History  Procedure Laterality Date  . Nm myoview ltd  07/2004    inferolatera wall ischemia  . Nm myocar perf wall motion  January 06, 2008     treadmill  myoview - tissue attenuation but no ischemia or infarction  . Doppler echocardiography  June 18 ,2009    mild concentric LVH, NORMAL ef, MILDLY  DILATED LEFT ATRIUM  AND MILD SCLEROTIC  AORTIC  VALVE AND OTHERWISE  NORMAL  . Cardiac catheterization  07/2004    80% proximal LAD  lesion--PCI, EF 60%  . Percutaneous coronary stent intervention (pci-s)  2/27/ 2006    TAXUS DES 3.0 mm x 16 mm stent ->  PROX  LAD  . Renal doppler  05/03/2007    rgt and lft renal arteries normal patency; rgt and lft kidneys normal size  . Umbilical hernia repair    . Appendectomy    . Cataract extraction    . Video assisted thoracoscopy (vats)/ lobectomy Left 08/10/2014    Procedure: VIDEO ASSISTED THORACOSCOPY (VATS)/ LOBECTOMY;  Surgeon: Ivin Poot, MD;  Location: [place];  Service: Thoracic;  Laterality: Left;  . Video bronchoscopy N/A 08/10/2014    Procedure: VIDEO BRONCHOSCOPY;  Surgeon: Ivin Poot, MD;  Location: [facility] OR;  Service: Thoracic;  Laterality: N/A;    Family History  Problem Relation Age of Onset  . Cancer Mother   . Heart disease Mother   . Heart disease Father   . Parkinsonism Brother     Social History History  Substance Use Topics  . Smoking status: Former Smoker    Types: Cigarettes    Quit date: 10/25/1982  . Smokeless tobacco: Not on file  . Alcohol Use: No    Current Outpatient Prescriptions  Medication Sig Dispense Refill  . acetaminophen (TYLENOL) 325 MG tablet Take 650 mg by mouth every 6 (six) hours as needed (pain).    Marland Kitchen aspirin EC 325 MG tablet Take 325 mg by mouth daily.    Marland Kitchen  isosorbide mononitrate (IMDUR) 30 MG 24 hr tablet TAKE 1 TABLET BY MOUTH DAILY. 90 tablet 1  . lactulose (CHRONULAC) 10 GM/15ML solution Take 30 mLs (20 g total) by mouth daily as needed for mild constipation. 240 mL 1  . metoprolol succinate (TOPROL-XL) 25 MG 24 hr tablet TAKE 1 TABLET BY MOUTH DAILY. 90 tablet 1  . prochlorperazine (COMPAZINE) 10 MG tablet Take 1 tablet (10 mg total) by mouth every 6 (six) hours as needed for nausea or vomiting. 30 tablet 0  . simvastatin (ZOCOR) 40 MG tablet TAKE 1 TABLET BY MOUTH QDF 90 tablet 1  . telmisartan-hydrochlorothiazide (MICARDIS HCT) 40-12.5 MG per tablet Take 0.5 tablets by mouth daily. 90 tablet 3  . traMADol  (ULTRAM) 50 MG tablet Take 50 mg by mouth every 6 (six) hours as needed for moderate pain.     No current facility-administered medications for this visit.    Allergies  Allergen Reactions  . Niaspan [Niacin Er] Other (See Comments)    flushing    Review of Systems  No weight loss or fever No shortness of breath or productive cough Mild postthoracotomy pain for which he takes when necessary tramadol No symptoms of angina or CHF Mild symptoms of arthritis in his hips and knees No syncope change in vision or difficulty with speech  BP 115/69 mmHg  Pulse 64  Resp 16  Ht '6\' 2"'$  (1.88 m)  Wt 205 lb (92.987 kg)  BMI 26.31 kg/m2  SpO2 97% Physical Exam Alert and comfortable Lungs clear Lymphatics without palpable nodes in the cervical or suprapubic or region Chest with clear breath sounds, well-healed left thoracotomy scar Cardiac regular rhythm without murmur or gallop Abdomen soft nontender without pulsatile mass Neurologic intact without focal motor deficit Extremities without cyanosis or tenderness  Diagnostic Tests: CT scan images independently reviewed  Impression: Clean chest CT scan one year postop left lower lobectomy with lymph node dissection for stage II a non-small cell carcinoma  Plan: Surveillance scans will be coordinated by Dr. Julien Nordmann and I will see the patient back as necessary. The patient has an appointment with Dr. Julien Nordmann late this year.  Len Childs, MD [facility] [phone]

## 2015-01-16 ENCOUNTER — Ambulatory Visit (INDEPENDENT_AMBULATORY_CARE_PROVIDER_SITE_OTHER): Payer: Medicare Other | Admitting: Cardiology

## 2015-01-16 ENCOUNTER — Encounter: Payer: Self-pay | Admitting: Cardiology

## 2015-01-16 VITALS — BP 120/70 | HR 72 | Ht 74.0 in | Wt 206.1 lb

## 2015-01-16 DIAGNOSIS — I10 Essential (primary) hypertension: Secondary | ICD-10-CM | POA: Diagnosis not present

## 2015-01-16 DIAGNOSIS — I2510 Atherosclerotic heart disease of native coronary artery without angina pectoris: Secondary | ICD-10-CM | POA: Diagnosis not present

## 2015-01-16 DIAGNOSIS — Z902 Acquired absence of lung [part of]: Secondary | ICD-10-CM

## 2015-01-16 DIAGNOSIS — Z9889 Other specified postprocedural states: Secondary | ICD-10-CM

## 2015-01-16 DIAGNOSIS — C3492 Malignant neoplasm of unspecified part of left bronchus or lung: Secondary | ICD-10-CM

## 2015-01-16 DIAGNOSIS — Z9861 Coronary angioplasty status: Secondary | ICD-10-CM

## 2015-01-16 DIAGNOSIS — I951 Orthostatic hypotension: Secondary | ICD-10-CM

## 2015-01-16 DIAGNOSIS — E785 Hyperlipidemia, unspecified: Secondary | ICD-10-CM | POA: Diagnosis not present

## 2015-01-16 NOTE — Progress Notes (Signed)
PCP: [name], MD  Clinic Note: Chief Complaint  Patient presents with  . Follow-up    had lower left lobe of lung removed in Jan 2016; stopped chemo X 2 weeks doing well;  no chest pain, shortness of breath-having some since surgery, edema- has a little in ankles, no pain in legs, no cramping in legs, lightheadedness-has a little if he getsw up to fast from bending over, dizziness-has a little after gettign up from bending over  . Coronary Artery Disease    HPI: Jeremy Deleon is a 81 y.o. male with a PMH below who presents today for postop visit after having partial left lower lobectomy by Dr. Tharon Aquas Trigt.  He is also completed his chemotherapy. The next plan is to do reevaluation in October. I saw on after his initial VATS guided biopsy, and he was having issues with profound dizziness likely related to orthostatic hypotension.  Past Medical History  Diagnosis Date  . CAD S/P percutaneous coronary angioplasty January 2006    Abnormal Myoview with inferolateral ischemia: --> 80% prox LAD lesion PCI: Taxus DES 3.0 mm x 16 mm-;; Myoview June 2009 no ischemia or infarction with attenuation artifact  . Hypertension   . Dyslipidemia, goal LDL below 70     on simvastatin and tricor  . Osteoarthritis   . BPH (benign prostatic hyperplasia)   . Squamous cell lung cancer : Left lower lobe  08/03/2014    Bronchoscopy showed extrinsic compression of the left lower lobe with endobronchial biopsies were negative. He subsequently had a needle biopsy which diagnosed the mass as squamous cell carcinoma   . GERD (gastroesophageal reflux disease)     Prior Cardiac Evaluation and Past Surgical History: Past Surgical History  Procedure Laterality Date  . Nm myoview ltd  07/2004    inferolatera wall ischemia  . Nm myocar perf wall motion  January 06, 2008     treadmill  myoview - tissue attenuation but no ischemia or infarction  . Doppler echocardiography  June 18 ,2009    mild concentric  LVH, NORMAL ef, MILDLY  DILATED LEFT ATRIUM  AND MILD SCLEROTIC  AORTIC  VALVE AND OTHERWISE  NORMAL  . Cardiac catheterization  07/2004    80% proximal LAD lesion--PCI, EF 60%  . Percutaneous coronary stent intervention (pci-s)  2/27/ 2006    TAXUS DES 3.0 mm x 16 mm stent ->  PROX  LAD  . Renal doppler  05/03/2007    rgt and lft renal arteries normal patency; rgt and lft kidneys normal size  . Umbilical hernia repair    . Appendectomy    . Cataract extraction    . Video assisted thoracoscopy (vats)/ lobectomy Left 08/10/2014    Procedure: VIDEO ASSISTED THORACOSCOPY (VATS)/ LOBECTOMY;  Surgeon: Ivin Poot, MD;  Location: [place];  Service: Thoracic;  Laterality: Left;  . Video bronchoscopy N/A 08/10/2014    Procedure: VIDEO BRONCHOSCOPY;  Surgeon: Ivin Poot, MD;  Location: [facility] OR;  Service: Thoracic;  Laterality: N/A;    no new studies or procedures from a cardiac standpoint  Interval History: Doing well 2 months out from last visit for dizziness.  No further spells. No syncope, TIA or murmurs fugax.   Does not describe PND, or orthopnea. He does have mild bilateral lower leg edema.   He denies chest pain with rest or exertion. No palpitations, TIA/amaurosis fugax symptoms, syncope/neasr syncopeNomelena, hematochezia, hematuria, or epstaxis. No claudication.  ROS: A comprehensive was performed. Review  of Systems  Constitutional: Negative for weight loss (pretty stable - w/ in 4 lb of normal weight) and malaise/fatigue (energy is gradually getting better).  HENT: Negative for congestion.   Respiratory: Negative for cough (couple times in AM). Shortness of breath: a bit @ baseline since surgery -    Cardiovascular: Positive for leg swelling. Negative for claudication.       Per HPI  Gastrointestinal: Negative for blood in stool and melena. Nausea: some with chemo.       Decreased PO intake - mostly @ dinner  Genitourinary: Positive for frequency (multple x per night - nocturia).  Negative for hematuria.  Neurological: Positive for dizziness. Negative for weakness and headaches.  Endo/Heme/Allergies: Bruises/bleeds easily.  Psychiatric/Behavioral: Positive for depression.  All other systems reviewed and are negative.   Current Outpatient Prescriptions on File Prior to Visit  Medication Sig Dispense Refill  . acetaminophen (TYLENOL) 325 MG tablet Take 650 mg by mouth every 6 (six) hours as needed (pain).    Marland Kitchen aspirin EC 325 MG tablet Take 325 mg by mouth daily.    . isosorbide mononitrate (IMDUR) 30 MG 24 hr tablet TAKE 1 TABLET BY MOUTH DAILY. 90 tablet 1  . lactulose (CHRONULAC) 10 GM/15ML solution Take 30 mLs (20 g total) by mouth daily as needed for mild constipation. 240 mL 1  . metoprolol succinate (TOPROL-XL) 25 MG 24 hr tablet TAKE 1 TABLET BY MOUTH DAILY. 90 tablet 1  . prochlorperazine (COMPAZINE) 10 MG tablet Take 1 tablet (10 mg total) by mouth every 6 (six) hours as needed for nausea or vomiting. 30 tablet 0  . simvastatin (ZOCOR) 40 MG tablet TAKE 1 TABLET BY MOUTH QDF 90 tablet 1  . telmisartan-hydrochlorothiazide (MICARDIS HCT) 40-12.5 MG per tablet Take 0.5 tablets by mouth daily. 90 tablet 3  . traMADol (ULTRAM) 50 MG tablet Take 50 mg by mouth every 6 (six) hours as needed for moderate pain.     No current facility-administered medications on file prior to visit.    Allergies  Allergen Reactions  . Niaspan [Niacin Er] Other (See Comments)    flushing    History  Substance Use Topics  . Smoking status: Former Smoker    Types: Cigarettes    Quit date: 10/25/1982  . Smokeless tobacco: Not on file  . Alcohol Use: No   Family History  Problem Relation Age of Onset  . Cancer Mother   . Heart disease Mother   . Heart disease Father   . Parkinsonism Brother     Wt Readings from Last 3 Encounters:  01/16/15 93.486 kg (206 lb 1.6 oz)  01/15/15 92.987 kg (205 lb)  12/21/14 93.033 kg (205 lb 1.6 oz)    PHYSICAL EXAM BP 120/70 mmHg   Pulse 72  Ht '6\' 2"'$  (1.88 m)  Wt 93.486 kg (206 lb 1.6 oz)  BMI 26.45 kg/m2 General appearance: alert, cooperative, appears stated age, appears a bit uncomfortable - queasy..  Looks a bit pale & weak.   Neck: no adenopathy, no carotid bruit and no JVD Lungs: clear to auscultation bilaterally, normal percussion bilaterally and non-labored Heart: regular rate and rhythm, S1, S2 normal, no murmur, click, rub or gallop ; nondisplaced Abdomen: soft, non-tender; bowel sounds normal; no masses,  no organomegaly; Extremities: extremities normal, atraumatic, no cyanosis, and mild edema  Pulses: roughly 1+ and thready bilaterally Neurologic: Mental status: Alert, oriented, thought content appropriate Cranial nerves: normal (II-XII grossly intact)    Adult ECG Report -  not checked  Recent Labs:  Not currently available.  ASSESSMENT / PLAN: Problem List Items Addressed This Visit    CAD S/P percutaneous coronary angioplasty -- Taxus DES in Prox LAD - Primary (Chronic)    No active anginal or heart failure symptoms. On aspirin, beta blocker and ARB along with statin.      Dyslipidemia, goal LDL below 70 (Chronic)    On statin alone now, no longer on fenofibrate. Labs and monitored by PCP.      Essential hypertension (Chronic)    Actually stable on current dose of Toprol and Micardis HCT at reduced dose      Orthostatic hypotension; with baseline hypotension (Chronic)    Overall symptoms are much improved with permissive hypertension. We backed off on his ARB/HCTZ dose at last visit. Now that he is no longer having chemotherapy this should not be as much of an issue. We may actually be able to titrate up the medication back to where it was was before.      S/P lobectomy of lung (Chronic)   Squamous cell lung cancer : Left lower lobe  (Chronic)     Patient Instructions: No change in current medications  Followup:  6 months    HARDING, Leonie [place], M.D., M.S. Interventional Cardiologist    Pager # [phone]

## 2015-01-16 NOTE — Patient Instructions (Addendum)
  NO CHANGE WITH CURRENT MEDICATIONS   Your physician wants you to follow-up in 6  Months  DR HARDING---30 MIN APPOINTMENT. You will receive a reminder letter in the mail two months in advance. If you don't receive a letter, please call our office to schedule the follow-up appointment.

## 2015-01-17 ENCOUNTER — Encounter: Payer: Self-pay | Admitting: Cardiology

## 2015-01-17 NOTE — Assessment & Plan Note (Signed)
On statin alone now, no longer on fenofibrate. Labs and monitored by PCP.

## 2015-01-17 NOTE — Assessment & Plan Note (Signed)
No active anginal or heart failure symptoms. On aspirin, beta blocker and ARB along with statin.

## 2015-01-17 NOTE — Assessment & Plan Note (Signed)
Overall symptoms are much improved with permissive hypertension. We backed off on his ARB/HCTZ dose at last visit. Now that he is no longer having chemotherapy this should not be as much of an issue. We may actually be able to titrate up the medication back to where it was was before.

## 2015-01-17 NOTE — Assessment & Plan Note (Signed)
Actually stable on current dose of Toprol and Micardis HCT at reduced dose

## 2015-02-02 ENCOUNTER — Other Ambulatory Visit: Payer: Self-pay | Admitting: Cardiology

## 2015-02-02 NOTE — Telephone Encounter (Signed)
REFILL 

## 2015-02-09 ENCOUNTER — Telehealth: Payer: Self-pay | Admitting: Cardiology

## 2015-02-09 NOTE — Telephone Encounter (Signed)
Left msg to call.

## 2015-02-09 NOTE — Telephone Encounter (Signed)
Pt would like for you to send him an updated medication list.

## 2015-02-13 NOTE — Telephone Encounter (Signed)
Mr.Covey returned the call . Please call .Marland Kitchen Thanks

## 2015-02-13 NOTE — Telephone Encounter (Signed)
Left message to call back  

## 2015-02-13 NOTE — Telephone Encounter (Signed)
Patient states he does not have AVS summary for 6/261/6 visit. Patient request a copy medication list RN mailed medication list

## 2015-04-16 ENCOUNTER — Other Ambulatory Visit (HOSPITAL_BASED_OUTPATIENT_CLINIC_OR_DEPARTMENT_OTHER): Payer: Medicare Other

## 2015-04-16 DIAGNOSIS — C3492 Malignant neoplasm of unspecified part of left bronchus or lung: Secondary | ICD-10-CM | POA: Diagnosis not present

## 2015-04-16 LAB — CBC WITH DIFFERENTIAL/PLATELET
BASO%: 0.3 % (ref 0.0–2.0)
Basophils Absolute: 0 10*3/uL (ref 0.0–0.1)
EOS%: 3.5 % (ref 0.0–7.0)
Eosinophils Absolute: 0.2 10*3/uL (ref 0.0–0.5)
HCT: 41.1 % (ref 38.4–49.9)
HGB: 13.9 g/dL (ref 13.0–17.1)
LYMPH%: 32.3 % (ref 14.0–49.0)
MCH: 30.3 pg (ref 27.2–33.4)
MCHC: 33.8 g/dL (ref 32.0–36.0)
MCV: 89.5 fL (ref 79.3–98.0)
MONO#: 0.5 10*3/uL (ref 0.1–0.9)
MONO%: 8.6 % (ref 0.0–14.0)
NEUT%: 55.3 % (ref 39.0–75.0)
NEUTROS ABS: 3.3 10*3/uL (ref 1.5–6.5)
PLATELETS: 178 10*3/uL (ref 140–400)
RBC: 4.59 10*6/uL (ref 4.20–5.82)
RDW: 14.1 % (ref 11.0–14.6)
WBC: 6 10*3/uL (ref 4.0–10.3)
lymph#: 2 10*3/uL (ref 0.9–3.3)

## 2015-04-16 LAB — COMPREHENSIVE METABOLIC PANEL (CC13)
ALK PHOS: 66 U/L (ref 40–150)
ALT: 11 U/L (ref 0–55)
AST: 19 U/L (ref 5–34)
Albumin: 3.6 g/dL (ref 3.5–5.0)
Anion Gap: 6 mEq/L (ref 3–11)
BUN: 19.5 mg/dL (ref 7.0–26.0)
CALCIUM: 9.1 mg/dL (ref 8.4–10.4)
CO2: 28 mEq/L (ref 22–29)
CREATININE: 1.2 mg/dL (ref 0.7–1.3)
Chloride: 108 mEq/L (ref 98–109)
EGFR: 55 mL/min/{1.73_m2} — ABNORMAL LOW (ref >90)
GLUCOSE: 108 mg/dL (ref 70–140)
Potassium: 3.9 mEq/L (ref 3.5–5.1)
Sodium: 143 mEq/L (ref 136–145)
TOTAL PROTEIN: 6.4 g/dL (ref 6.4–8.3)
Total Bilirubin: 0.54 mg/dL (ref 0.20–1.20)

## 2015-04-20 ENCOUNTER — Encounter (HOSPITAL_COMMUNITY): Payer: Self-pay

## 2015-04-20 ENCOUNTER — Ambulatory Visit (HOSPITAL_COMMUNITY)
Admission: RE | Admit: 2015-04-20 | Discharge: 2015-04-20 | Disposition: A | Discharge status: Home / Self Care | Payer: Medicare Other | Source: Ambulatory Visit | Attending: Internal Medicine | Admitting: Internal Medicine

## 2015-04-20 DIAGNOSIS — K8020 Calculus of gallbladder without cholecystitis without obstruction: Secondary | ICD-10-CM | POA: Diagnosis not present

## 2015-04-20 DIAGNOSIS — C3492 Malignant neoplasm of unspecified part of left bronchus or lung: Secondary | ICD-10-CM | POA: Diagnosis present

## 2015-04-20 DIAGNOSIS — J8410 Pulmonary fibrosis, unspecified: Secondary | ICD-10-CM | POA: Insufficient documentation

## 2015-04-20 DIAGNOSIS — M479 Spondylosis, unspecified: Secondary | ICD-10-CM | POA: Diagnosis not present

## 2015-04-20 DIAGNOSIS — J90 Pleural effusion, not elsewhere classified: Secondary | ICD-10-CM | POA: Insufficient documentation

## 2015-04-20 DIAGNOSIS — I7090 Unspecified atherosclerosis: Secondary | ICD-10-CM | POA: Insufficient documentation

## 2015-04-20 DIAGNOSIS — J439 Emphysema, unspecified: Secondary | ICD-10-CM | POA: Diagnosis not present

## 2015-04-20 DIAGNOSIS — Z9221 Personal history of antineoplastic chemotherapy: Secondary | ICD-10-CM | POA: Insufficient documentation

## 2015-04-20 MED ORDER — IOHEXOL 300 MG/ML  SOLN
75.0000 mL | Freq: Once | INTRAMUSCULAR | Status: AC | PRN
  Administered 2015-04-20: 75 mL via INTRAVENOUS

## 2015-04-23 ENCOUNTER — Ambulatory Visit (HOSPITAL_BASED_OUTPATIENT_CLINIC_OR_DEPARTMENT_OTHER): Payer: Medicare Other | Admitting: Internal Medicine

## 2015-04-23 ENCOUNTER — Encounter: Payer: Self-pay | Admitting: Internal Medicine

## 2015-04-23 ENCOUNTER — Telehealth: Payer: Self-pay | Admitting: Internal Medicine

## 2015-04-23 VITALS — BP 137/65 | HR 68 | Temp 97.7°F | Resp 18 | Ht 74.0 in | Wt 214.4 lb

## 2015-04-23 DIAGNOSIS — I10 Essential (primary) hypertension: Secondary | ICD-10-CM

## 2015-04-23 DIAGNOSIS — C3432 Malignant neoplasm of lower lobe, left bronchus or lung: Secondary | ICD-10-CM | POA: Diagnosis not present

## 2015-04-23 DIAGNOSIS — C3492 Malignant neoplasm of unspecified part of left bronchus or lung: Secondary | ICD-10-CM

## 2015-04-23 DIAGNOSIS — Z789 Other specified health status: Secondary | ICD-10-CM

## 2015-04-23 NOTE — Telephone Encounter (Signed)
per pof to sch pt appt-gave pt copy of avs-adv pt [facility] will call to sch scan

## 2015-04-23 NOTE — Progress Notes (Signed)
[facility] [phone]   [phone]) [phone]  OFFICE PROGRESS NOTE  Jeremy Salinas, MD [place] [address]  DIAGNOSIS: Stage IIA (T2b, N0, M0) non-small cell lung cancer consistent with squamous cell carcinoma diagnosed in January 2016  PRIOR THERAPY:  1) Status post left VATS with left lower lobectomy and mediastinal lymph node dissection under the care of Dr. Prescott Gum on 08/10/2014. 2) Adjuvant systemic chemotherapy with carboplatin for AUC of 5 on day 1 and gemcitabine 1000 MG/M2 on days 1 and 8 every 3 weeks. First dose 09/19/2014.  CURRENT THERAPY: Observation.  INTERVAL HISTORY: Jeremy Deleon 81 y.o. male returns to the clinic today for follow-up visit. The patient is feeling fine today with no specific complaints. He has been on observation for the last few months.  He denied having any significant fever or chills, no nausea or vomiting. The patient denied having any significant weight loss or night sweats. He has no chest pain, shortness of breath, cough or hemoptysis. He had repeat CT scan of the chest performed recently and he is here for evaluation and discussion of his scan results.  MEDICAL HISTORY: Past Medical History  Diagnosis Date  . CAD S/P percutaneous coronary angioplasty January 2006    Abnormal Myoview with inferolateral ischemia: --> 80% prox LAD lesion PCI: Taxus DES 3.0 mm x 16 mm-;; Myoview June 2009 no ischemia or infarction with attenuation artifact  . Hypertension   . Dyslipidemia, goal LDL below 70     on simvastatin and tricor  . Osteoarthritis   . BPH (benign prostatic hyperplasia)   . GERD (gastroesophageal reflux disease)   . Squamous cell lung cancer : Left lower lobe  08/03/2014    Bronchoscopy showed extrinsic compression of the left lower lobe with endobronchial biopsies were negative. He subsequently had a needle biopsy which diagnosed the mass as squamous cell carcinoma      ALLERGIES:  is allergic to niaspan.  MEDICATIONS:  Current Outpatient Prescriptions  Medication Sig Dispense Refill  . acetaminophen (TYLENOL) 325 MG tablet Take 650 mg by mouth every 6 (six) hours as needed (pain).    Marland Kitchen aspirin EC 325 MG tablet Take 325 mg by mouth daily.    . fenofibrate (TRICOR) 145 MG tablet TAKE 1 TABLET BY MOUTH EVERY DAY 90 tablet 1  . isosorbide mononitrate (IMDUR) 30 MG 24 hr tablet TAKE 1 TABLET BY MOUTH DAILY. 90 tablet 1  . lactulose (CHRONULAC) 10 GM/15ML solution Take 30 mLs (20 g total) by mouth daily as needed for mild constipation. 240 mL 1  . metoprolol succinate (TOPROL-XL) 25 MG 24 hr tablet TAKE 1 TABLET BY MOUTH DAILY. 90 tablet 1  . prochlorperazine (COMPAZINE) 10 MG tablet Take 1 tablet (10 mg total) by mouth every 6 (six) hours as needed for nausea or vomiting. 30 tablet 0  . simvastatin (ZOCOR) 40 MG tablet TAKE 1 TABLET BY MOUTH QDF 90 tablet 1  . telmisartan-hydrochlorothiazide (MICARDIS HCT) 40-12.5 MG per tablet Take 0.5 tablets by mouth daily. 90 tablet 3  . traMADol (ULTRAM) 50 MG tablet Take 50 mg by mouth every 6 (six) hours as needed for moderate pain.     No current facility-administered medications for this visit.    SURGICAL HISTORY:  Past Surgical History  Procedure Laterality Date  . Nm myoview ltd  07/2004    inferolatera wall ischemia  . Nm myocar perf wall motion  January 06, 2008  treadmill  myoview - tissue attenuation but no ischemia or infarction  . Doppler echocardiography  June 18 ,2009    mild concentric LVH, NORMAL ef, MILDLY  DILATED LEFT ATRIUM  AND MILD SCLEROTIC  AORTIC  VALVE AND OTHERWISE  NORMAL  . Cardiac catheterization  07/2004    80% proximal LAD lesion--PCI, EF 60%  . Percutaneous coronary stent intervention (pci-s)  2/27/ 2006    TAXUS DES 3.0 mm x 16 mm stent ->  PROX  LAD  . Renal doppler  05/03/2007    rgt and lft renal arteries normal patency; rgt and lft kidneys normal size  . Umbilical hernia  repair    . Appendectomy    . Cataract extraction    . Video assisted thoracoscopy (vats)/ lobectomy Left 08/10/2014    Procedure: VIDEO ASSISTED THORACOSCOPY (VATS)/ LOBECTOMY;  Surgeon: Ivin Poot, MD;  Location: [facility];  Service: Thoracic;  Laterality: Left;  . Video bronchoscopy N/A 08/10/2014    Procedure: VIDEO BRONCHOSCOPY;  Surgeon: Ivin Poot, MD;  Location: [facility] OR;  Service: Thoracic;  Laterality: N/A;    REVIEW OF SYSTEMS:  A comprehensive review of systems was negative.   PHYSICAL EXAMINATION: General appearance: alert, cooperative and no distress Head: Normocephalic, without obvious abnormality, atraumatic Neck: no adenopathy, no JVD, supple, symmetrical, trachea midline and thyroid not enlarged, symmetric, no tenderness/mass/nodules Lymph nodes: Cervical, supraclavicular, and axillary nodes normal. Resp: clear to auscultation bilaterally Back: symmetric, no curvature. ROM normal. No CVA tenderness. Cardio: normal apical impulse GI: soft, non-tender; bowel sounds normal; no masses,  no organomegaly Extremities: extremities normal, atraumatic, no cyanosis or edema Neurologic: Alert and oriented X 3, normal strength and tone. Normal symmetric reflexes. Normal coordination and gait  ECOG PERFORMANCE STATUS: 1 - Symptomatic but completely ambulatory  There were no vitals taken for this visit.  LABORATORY DATA: Lab Results  Component Value Date   WBC 6.0 04/16/2015   HGB 13.9 04/16/2015   HCT 41.1 04/16/2015   MCV 89.5 04/16/2015   PLT 178 04/16/2015      Chemistry      Component Value Date/Time   NA 143 04/16/2015 0909   NA 140 08/14/2014 0255   K 3.9 04/16/2015 0909   K 3.6 08/14/2014 0255   CL 105 08/14/2014 0255   CO2 28 04/16/2015 0909   CO2 29 08/14/2014 0255   BUN 19.5 04/16/2015 0909   BUN 19 08/14/2014 0255   CREATININE 1.2 04/16/2015 0909   CREATININE 0.96 08/14/2014 0255      Component Value Date/Time   CALCIUM 9.1 04/16/2015 0909    CALCIUM 8.6 08/14/2014 0255   ALKPHOS 66 04/16/2015 0909   ALKPHOS 49 08/14/2014 0255   AST 19 04/16/2015 0909   AST 20 08/14/2014 0255   ALT 11 04/16/2015 0909   ALT 11 08/14/2014 0255   BILITOT 0.54 04/16/2015 0909   BILITOT 0.9 08/14/2014 0255       RADIOGRAPHIC STUDIES: Ct Chest W Contrast  04/20/2015   CLINICAL DATA:  Restaging lung cancer diagnosed in January 2016. Previous left lung surgery with chemotherapy completed. Subsequent encounter.  EXAM: CT CHEST WITH CONTRAST  TECHNIQUE: Multidetector CT imaging of the chest was performed during intravenous contrast administration.  CONTRAST:  52m OMNIPAQUE IOHEXOL 300 MG/ML  SOLN  COMPARISON:  Chest CT 12/19/2014.  PET-CT 06/29/2014.  FINDINGS: Mediastinum/Nodes: There are no enlarged mediastinal, hilar or axillary lymph nodes.Small right hilar nodes have improved. There are improving postsurgical changes inferior to the left  hilum. The thyroid gland, trachea and esophagus demonstrate no significant findings. The heart size is normal. There is no pericardial effusion. There is atherosclerosis of the aorta, great vessels and coronary arteries.  Lungs/Pleura: Status post left lower lobe resection. A small left pleural effusion appears about the same. Several calcified granulomas are again noted within the right lung. There are no suspicious pulmonary nodules. Mild emphysematous changes are present.  Upper abdomen: Several small gallstones are again noted. There is no adrenal mass. The visualized liver appears unremarkable.  Musculoskeletal/Chest wall: There is no chest wall mass or suspicious osseous finding. There are stable degenerative changes throughout the spine.  IMPRESSION: 1. Stable postoperative chest CT. No evidence of local recurrence or metastatic disease. 2. Stable small left pleural effusion. 3. Evidence of prior granulomatous disease with scattered right lung granulomas. 4. Atherosclerosis, cholelithiasis and spondylosis again noted.    Electronically Signed   By: Richardean Sale M.D.   On: 04/20/2015 09:18    ASSESSMENT AND PLAN: This is a very pleasant 81 years old white male recently diagnosed with a stage IIA non-small cell lung cancer, squamous cell carcinoma status post left lower lobectomy with lymph node dissection. This was followed by 4 cycles of adjuvant chemotherapy with carboplatin and gemcitabine. The patient tolerated his treatment fairly well with no significant complaints except for mild fatigue. The recent CT scan of the chest showed no evidence for disease recurrence. I discussed the scan results with the patient today. I recommended for him to continue on observation with repeat CT scan of the chest in 4 months. CODE STATUS was discussed with the patient today and he requested full CODE STATUS but no prolonged resuscitation if he has a vegetative state. He was advised to call immediately if he has any concerning symptoms in the interval. The patient voices understanding of current disease status and treatment options and is in agreement with the current care plan.  All questions were answered. The patient knows to call the clinic with any problems, questions or concerns. We can certainly see the patient much sooner if necessary.  Disclaimer: This note was dictated with voice recognition software. Similar sounding words can inadvertently be transcribed and may not be corrected upon review.

## 2015-04-25 ENCOUNTER — Other Ambulatory Visit: Payer: Self-pay | Admitting: Cardiology

## 2015-04-25 NOTE — Telephone Encounter (Signed)
REFILL 

## 2015-05-11 IMAGING — CR DG CHEST 2V
2 series · 2 of 2 positions shown · non-contrast
Comparison: 08/15/2014.

CLINICAL DATA: Lung cancer.  Left lung surgery.

EXAM:
CHEST  2 VIEW

[view not recorded (1 of 2)]
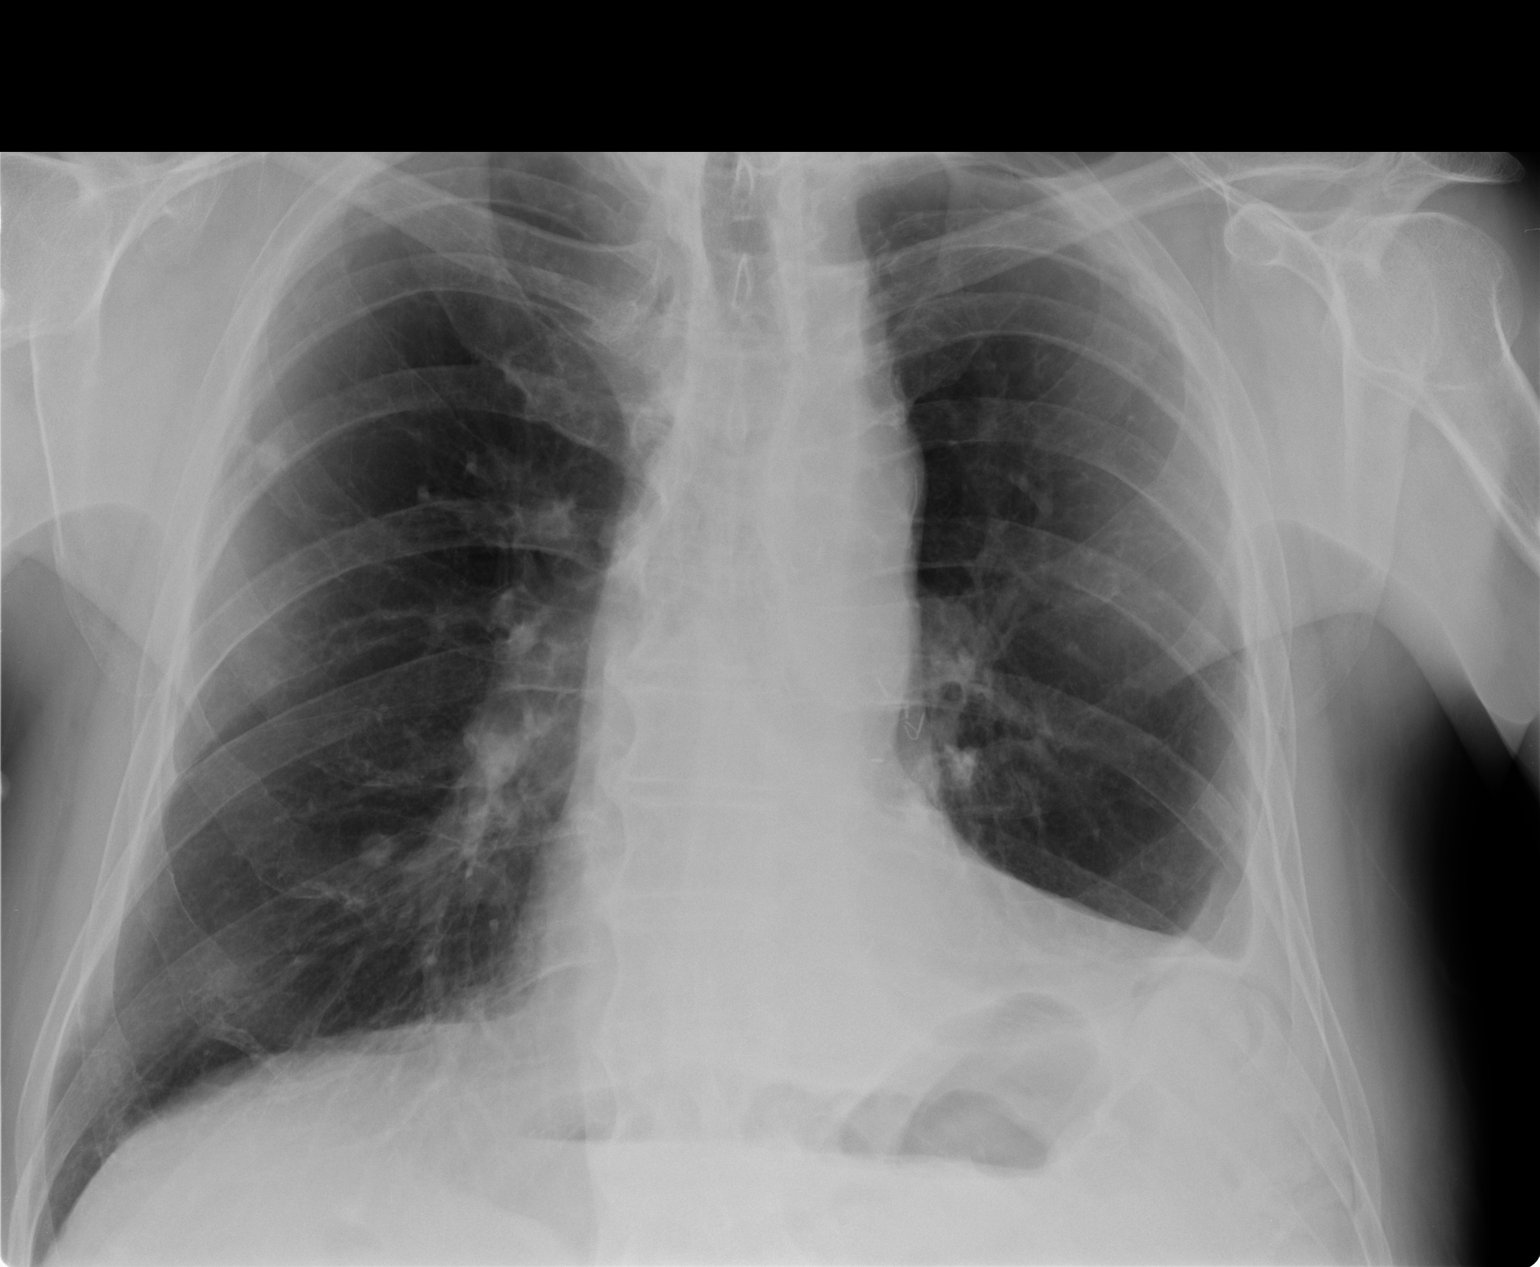

[view not recorded (2 of 2)]
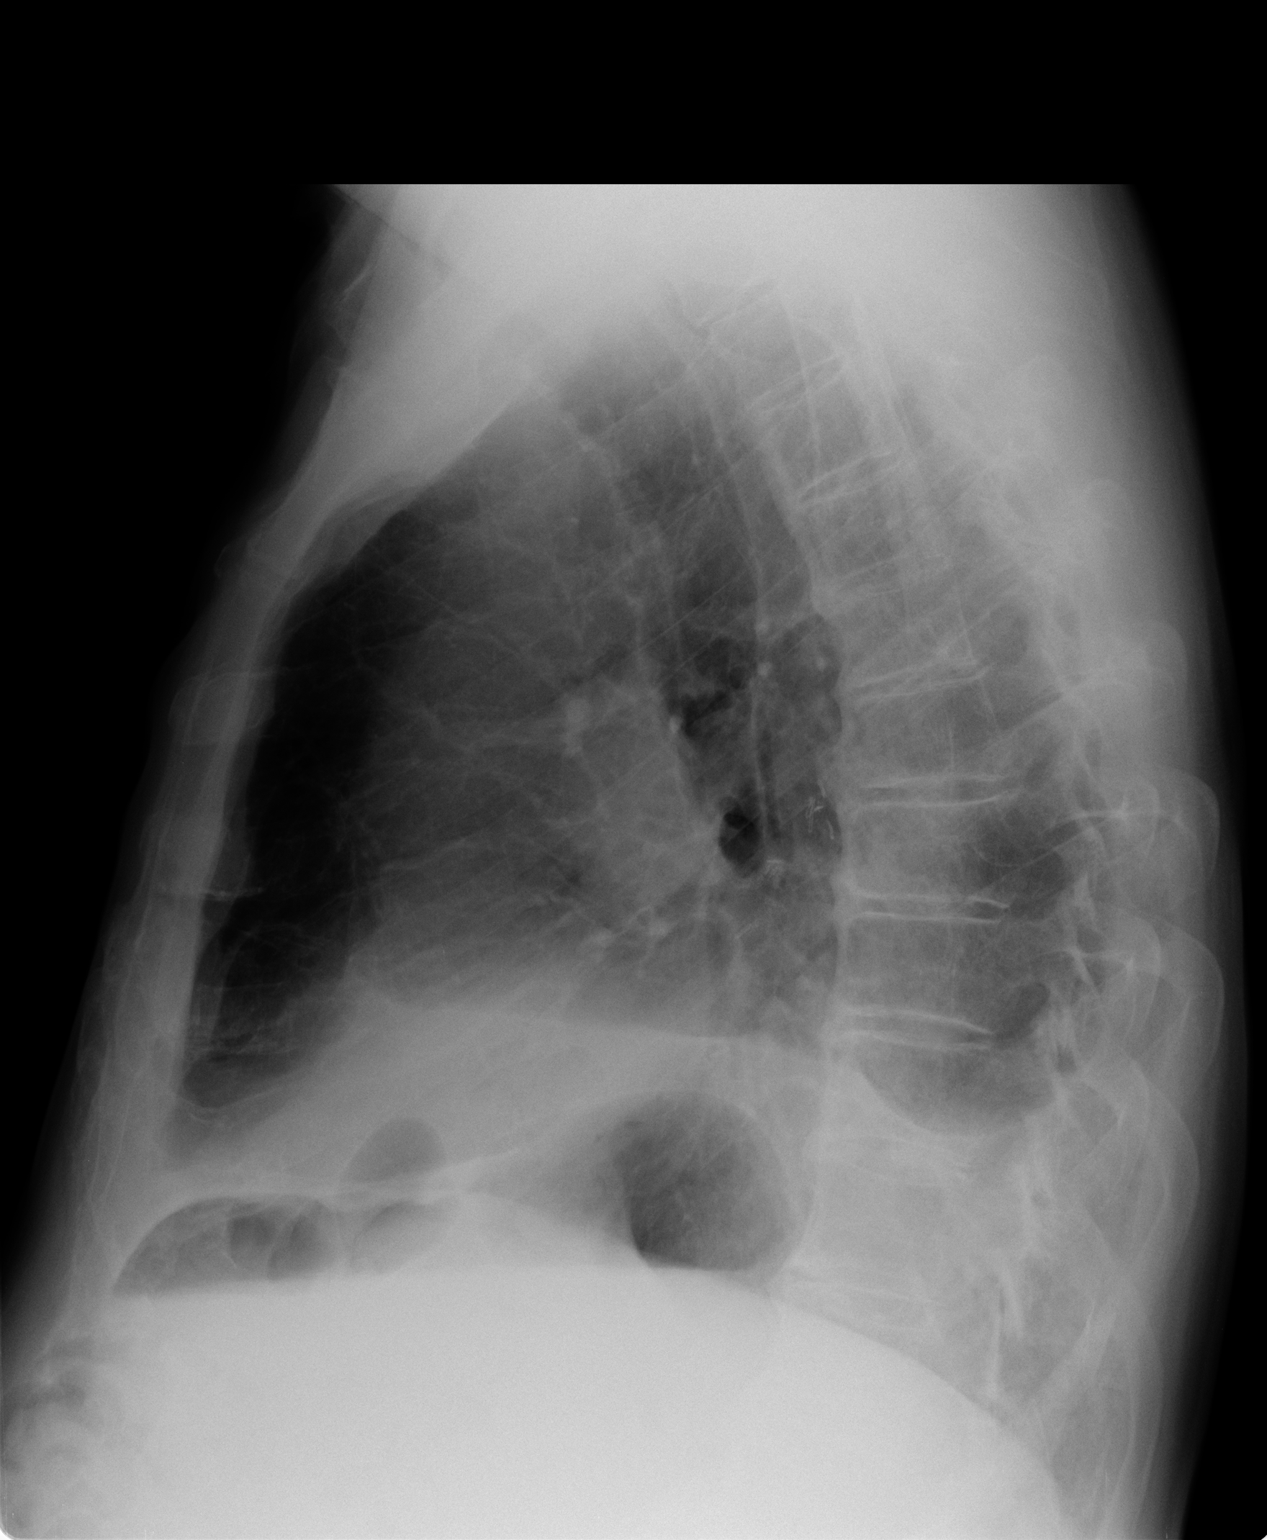

[2 of 2 positions shown; findings below may reference images not displayed]

FINDINGS: Mediastinum hilar structures normal. Left lower lobectomy. Small
left pleural effusion. Calcified pulmonary nodules right lung
consistent granulomas. Very tiny residual left apical pneumothorax,
decreased from prior exam. Heart size stable. Pulmonary vascularity
normal. Degenerative changes thoracic spine.
IMPRESSION: 1. Left lower lobectomy. Small left pleural effusion. Tiny residual
left apical pneumothorax, diminished from prior exam.
2. Calcified pulmonary granulomas right lung.

## 2015-06-18 ENCOUNTER — Telehealth: Payer: Self-pay | Admitting: Cardiology

## 2015-06-18 NOTE — Telephone Encounter (Signed)
Pt called for med clarification. Reviewed charts from June and April of this year. Per Dr. Allison Quarry instruction, pt instructed to stop isosorbide in April. Somehow med was put back on med list in June.  His insurance is no longer covering medication.  Advised to discontinue per instruction, if any problems call back. Pt aware and voiced understanding.

## 2015-06-18 NOTE — Telephone Encounter (Signed)
Jeremy Deleon is calling about Isosorbide '30mg'$ , he wants to know should he still be taking this medication . Please call   Thanks

## 2015-07-25 ENCOUNTER — Encounter: Payer: Self-pay | Admitting: Endocrinology

## 2015-07-25 ENCOUNTER — Ambulatory Visit (INDEPENDENT_AMBULATORY_CARE_PROVIDER_SITE_OTHER): Payer: Medicare Other | Admitting: Endocrinology

## 2015-07-25 VITALS — BP 132/80 | HR 87 | Temp 97.8°F | Ht 74.0 in | Wt 211.0 lb

## 2015-07-25 DIAGNOSIS — E0590 Thyrotoxicosis, unspecified without thyrotoxic crisis or storm: Secondary | ICD-10-CM

## 2015-07-25 MED ORDER — METHIMAZOLE 10 MG PO TABS: 10.0000 mg | ORAL_TABLET | Freq: Two times a day (BID) | ORAL | Status: DC

## 2015-07-25 NOTE — Patient Instructions (Signed)
i have sent a prescription to your pharmacy, to slow down the thyroid. if ever you have fever while taking methimazole, stop it and call us, even if the reason is obvious, because of the risk of a rare side-effect. Please come back for a follow-up appointment in 2-3 weeks.     Hyperthyroidism Hyperthyroidism is when the thyroid is too active (overactive). Your thyroid is a large gland that is located in your neck. The thyroid helps to control how your body uses food (metabolism). When your thyroid is overactive, it produces too much of a hormone called thyroxine.  CAUSES Causes of hyperthyroidism may include:  Graves disease. This is when your immune system attacks the thyroid gland. This is the most common cause.  Inflammation of the thyroid gland.  Tumor in the thyroid gland or somewhere else.  Excessive use of thyroid medicines, including:  Prescription thyroid supplement.  Herbal supplements that mimic thyroid hormones.  Solid or fluid-filled lumps within your thyroid gland (thyroid nodules).  Excessive ingestion of iodine. RISK FACTORS  Being male.  Having a family history of thyroid conditions. SIGNS AND SYMPTOMS Signs and symptoms of hyperthyroidism may include:  Nervousness.  Inability to tolerate heat.  Unexplained weight loss.  Diarrhea.  Change in the texture of hair or skin.  Heart skipping beats or making extra beats.  Rapid heart rate.  Loss of menstruation.  Shaky hands.  Fatigue.  Restlessness.  Increased appetite.  Sleep problems.  Enlarged thyroid gland or nodules. DIAGNOSIS  Diagnosis of hyperthyroidism may include:  Medical history and physical exam.  Blood tests.  Ultrasound tests. TREATMENT Treatment may include:  Medicines to control your thyroid.  Surgery to remove your thyroid.  Radiation therapy. HOME CARE INSTRUCTIONS   Take medicines only as directed by your health care provider.  Do not use any tobacco  products, including cigarettes, chewing tobacco, or electronic cigarettes. If you need help quitting, ask your health care provider.  Do not exercise or do physical activity until your health care provider approves.  Keep all follow-up appointments as directed by your health care provider. This is important. SEEK MEDICAL CARE IF:  Your symptoms do not get better with treatment.  You have fever.  You are taking thyroid replacement medicine and you:  Have depression.  Feel mentally and physically slow.  Have weight gain. SEEK IMMEDIATE MEDICAL CARE IF:   You have decreased alertness or a change in your awareness.  You have abdominal pain.  You feel dizzy.  You have a rapid heartbeat.  You have an irregular heartbeat.   This information is not intended to replace advice given to you by your health care provider. Make sure you discuss any questions you have with your health care provider.   Document Released: 07/07/2005 Document Revised: 07/28/2014 Document Reviewed: 11/22/2013 Elsevier Interactive Patient Education [employer].

## 2015-07-25 NOTE — Progress Notes (Signed)
Subjective:    Patient ID: Jeremy Deleon, male    DOB: 05/28/1933, 82 y.o.   MRN: [id number]  HPI Pt was dx'ed with hyperthyroidism in Nov of 2016.  He has never been on therapy for this.  He has never had XRT to the anterior neck, or thyroid surgery.  He has never had dedicated thyroid imaging.  He does not consume kelp or any other prescribed or non-prescribed thyroid medication.  He has never been on amiodarone.  He has slight palpitations in the chest, but no assoc fever.  Past Medical History  Diagnosis Date  . CAD S/P percutaneous coronary angioplasty January 2006    Abnormal Myoview with inferolateral ischemia: --> 80% prox LAD lesion PCI: Taxus DES 3.0 mm x 16 mm-;; Myoview June 2009 no ischemia or infarction with attenuation artifact  . Hypertension   . Dyslipidemia, goal LDL below 70     on simvastatin and tricor  . Osteoarthritis   . BPH (benign prostatic hyperplasia)   . GERD (gastroesophageal reflux disease)   . Squamous cell lung cancer : Left lower lobe  08/03/2014    Bronchoscopy showed extrinsic compression of the left lower lobe with endobronchial biopsies were negative. He subsequently had a needle biopsy which diagnosed the mass as squamous cell carcinoma   . Full code status 04/23/2015    Past Surgical History  Procedure Laterality Date  . Nm myoview ltd  07/2004    inferolatera wall ischemia  . Nm myocar perf wall motion  January 06, 2008     treadmill  myoview - tissue attenuation but no ischemia or infarction  . Doppler echocardiography  June 18 ,2009    mild concentric LVH, NORMAL ef, MILDLY  DILATED LEFT ATRIUM  AND MILD SCLEROTIC  AORTIC  VALVE AND OTHERWISE  NORMAL  . Cardiac catheterization  07/2004    80% proximal LAD lesion--PCI, EF 60%  . Percutaneous coronary stent intervention (pci-s)  2/27/ 2006    TAXUS DES 3.0 mm x 16 mm stent ->  PROX  LAD  . Renal doppler  05/03/2007    rgt and lft renal arteries normal patency; rgt and lft kidneys normal size  .  Umbilical hernia repair    . Appendectomy    . Cataract extraction    . Video assisted thoracoscopy (vats)/ lobectomy Left 08/10/2014    Procedure: VIDEO ASSISTED THORACOSCOPY (VATS)/ LOBECTOMY;  Surgeon: Ivin Poot, MD;  Location: [facility];  Service: Thoracic;  Laterality: Left;  . Video bronchoscopy N/A 08/10/2014    Procedure: VIDEO BRONCHOSCOPY;  Surgeon: Ivin Poot, MD;  Location: [facility] OR;  Service: Thoracic;  Laterality: N/A;    Social History   Social History  . Marital Status: Single    Spouse Name: N/A  . Number of Children: N/A  . Years of Education: N/A   Occupational History  . Not on file.   Social History Main Topics  . Smoking status: Former Smoker    Types: Cigarettes    Quit date: 10/25/1982  . Smokeless tobacco: Not on file  . Alcohol Use: No  . Drug Use: No  . Sexual Activity: Not on file   Other Topics Concern  . Not on file   Social History Narrative    He is a widowed father of 51, grandfather of 17, still working. Does not smoke, quit 30   years ago and does not drink alcohol. He still works in his Nurse, learning disability for [employer].  Current Outpatient Prescriptions on File Prior to Visit  Medication Sig Dispense Refill  . acetaminophen (TYLENOL) 325 MG tablet Take 650 mg by mouth every 6 (six) hours as needed (pain).    Marland Kitchen aspirin EC 325 MG tablet Take 325 mg by mouth daily.    . fenofibrate (TRICOR) 145 MG tablet TAKE 1 TABLET BY MOUTH EVERY DAY 90 tablet 1  . isosorbide mononitrate (IMDUR) 30 MG 24 hr tablet TAKE 1 TABLET BY MOUTH DAILY. 90 tablet 1  . metoprolol succinate (TOPROL-XL) 25 MG 24 hr tablet TAKE 1 TABLET BY MOUTH DAILY. 90 tablet 1  . prochlorperazine (COMPAZINE) 10 MG tablet Take 1 tablet (10 mg total) by mouth every 6 (six) hours as needed for nausea or vomiting. 30 tablet 0  . simvastatin (ZOCOR) 40 MG tablet TAKE 1 TABLET BY MOUTH EVERY DAY 90 tablet 0  . telmisartan-hydrochlorothiazide (MICARDIS HCT) 40-12.5  MG per tablet Take 0.5 tablets by mouth daily. 90 tablet 3  . traMADol (ULTRAM) 50 MG tablet Take 50 mg by mouth every 6 (six) hours as needed for moderate pain.    Marland Kitchen lactulose (CHRONULAC) 10 GM/15ML solution Take 30 mLs (20 g total) by mouth daily as needed for mild constipation. (Patient not taking: Reported on 07/25/2015) 240 mL 1   No current facility-administered medications on file prior to visit.    Allergies  Allergen Reactions  . Niaspan [Niacin Er] Other (See Comments)    flushing    Family History  Problem Relation Age of Onset  . Cancer Mother   . Heart disease Mother   . Heart disease Father   . Parkinsonism Brother   . Thyroid disease Neg Hx     BP 132/80 mmHg  Pulse 87  Temp(Src) 97.8 F (36.6 C) (Oral)  Ht '6\' 2"'$  (1.88 m)  Wt 211 lb (95.709 kg)  BMI 27.08 kg/m2  SpO2 98%  Review of Systems denies weight loss, headache, hoarseness, visual loss, chest pain, diarrhea, polyuria, muscle weakness, excessive diaphoresis, tremor, anxiety, heat intolerance, and easy bruising.  He has doe and rhinorrhea.    Objective:   Physical Exam VS: see vs page GEN: no distress. HEAD: head: no deformity. eyes: no periorbital swelling, no proptosis external nose and ears are normal mouth: no lesion seen. NECK: supple, thyroid is not enlarged CHEST WALL: no deformity.  LUNGS: clear to auscultation. BREASTS:  bilat pseudogynecomastia. CV: reg rate and rhythm, no murmur. ABD: abdomen is soft, nontender.  no hepatosplenomegaly.  not distended.  no hernia.   MUSCULOSKELETAL: muscle bulk and strength are grossly normal.  no obvious joint swelling.  gait is slow but steady EXTEMITIES: no deformity.  Trace bilat leg edema.   NEURO:  cn 2-12 grossly intact.   readily moves all 4's.  sensation is intact to touch on all 4's.  Slight tremor of the hands. SKIN:  Normal texture and temperature.  No rash or suspicious lesion is visible.  Not diaphoretic.  Ecchymoses of both  forearms. NODES:  None palpable at the neck PSYCH: alert, well-oriented.  Does not appear anxious nor depressed.   Radiol: chest CT: no goiter is mentioned. TSH=0.058    I have reviewed outside records, and summarized: Pt was noted to have low TSH, and referred here.      Assessment & Plan:  Hyperthyroidism, new, prob due to Grave's dz. Lung cancer: due to the need for frequent CT scans with contrast, he is not a candidate for I-131 rx.   Patient  is advised the following: Patient Instructions  i have sent a prescription to your pharmacy, to slow down the thyroid. if ever you have fever while taking methimazole, stop it and call us, even if the reason is obvious, because of the risk of a rare side-effect. Please come back for a follow-up appointment in 2-3 weeks.     Hyperthyroidism Hyperthyroidism is when the thyroid is too active (overactive). Your thyroid is a large gland that is located in your neck. The thyroid helps to control how your body uses food (metabolism). When your thyroid is overactive, it produces too much of a hormone called thyroxine.  CAUSES Causes of hyperthyroidism may include:  Graves disease. This is when your immune system attacks the thyroid gland. This is the most common cause.  Inflammation of the thyroid gland.  Tumor in the thyroid gland or somewhere else.  Excessive use of thyroid medicines, including:  Prescription thyroid supplement.  Herbal supplements that mimic thyroid hormones.  Solid or fluid-filled lumps within your thyroid gland (thyroid nodules).  Excessive ingestion of iodine. RISK FACTORS  Being male.  Having a family history of thyroid conditions. SIGNS AND SYMPTOMS Signs and symptoms of hyperthyroidism may include:  Nervousness.  Inability to tolerate heat.  Unexplained weight loss.  Diarrhea.  Change in the texture of hair or skin.  Heart skipping beats or making extra beats.  Rapid heart rate.  Loss of  menstruation.  Shaky hands.  Fatigue.  Restlessness.  Increased appetite.  Sleep problems.  Enlarged thyroid gland or nodules. DIAGNOSIS  Diagnosis of hyperthyroidism may include:  Medical history and physical exam.  Blood tests.  Ultrasound tests. TREATMENT Treatment may include:  Medicines to control your thyroid.  Surgery to remove your thyroid.  Radiation therapy. HOME CARE INSTRUCTIONS   Take medicines only as directed by your health care provider.  Do not use any tobacco products, including cigarettes, chewing tobacco, or electronic cigarettes. If you need help quitting, ask your health care provider.  Do not exercise or do physical activity until your health care provider approves.  Keep all follow-up appointments as directed by your health care provider. This is important. SEEK MEDICAL CARE IF:  Your symptoms do not get better with treatment.  You have fever.  You are taking thyroid replacement medicine and you:  Have depression.  Feel mentally and physically slow.  Have weight gain. SEEK IMMEDIATE MEDICAL CARE IF:   You have decreased alertness or a change in your awareness.  You have abdominal pain.  You feel dizzy.  You have a rapid heartbeat.  You have an irregular heartbeat.   This information is not intended to replace advice given to you by your health care provider. Make sure you discuss any questions you have with your health care provider.   Document Released: 07/07/2005 Document Revised: 07/28/2014 Document Reviewed: 11/22/2013 Elsevier Interactive Patient Education [employer].

## 2015-07-26 DIAGNOSIS — E0590 Thyrotoxicosis, unspecified without thyrotoxic crisis or storm: Secondary | ICD-10-CM | POA: Insufficient documentation

## 2015-08-03 ENCOUNTER — Other Ambulatory Visit: Payer: Self-pay | Admitting: Cardiology

## 2015-08-03 MED ORDER — FENOFIBRATE 145 MG PO TABS: 145.0000 mg | ORAL_TABLET | Freq: Every day | ORAL | Status: DC

## 2015-08-03 MED ORDER — SIMVASTATIN 40 MG PO TABS: 40.0000 mg | ORAL_TABLET | Freq: Every day | ORAL | Status: DC

## 2015-08-03 NOTE — Telephone Encounter (Signed)
Patient states [facility] in [place] is no longer taking his drug program.  He needs new rx for his Fenofibrate 145 mg #90 1 daily with 3 refills and Simvastatin 40 mg #90 1 daily with 3 refills called to [facility] on the corner of [address].

## 2015-08-03 NOTE — Telephone Encounter (Signed)
Fenofibrate and Simvastatin refilled electronically to [facility].

## 2015-08-15 ENCOUNTER — Encounter: Payer: Self-pay | Admitting: Endocrinology

## 2015-08-15 ENCOUNTER — Ambulatory Visit (INDEPENDENT_AMBULATORY_CARE_PROVIDER_SITE_OTHER): Payer: Medicare Other | Admitting: Endocrinology

## 2015-08-15 VITALS — BP 122/64 | HR 112 | Temp 97.8°F | Ht 74.0 in | Wt 215.0 lb

## 2015-08-15 DIAGNOSIS — E0590 Thyrotoxicosis, unspecified without thyrotoxic crisis or storm: Secondary | ICD-10-CM

## 2015-08-15 LAB — TSH: TSH: 0.33 u[IU]/mL — AB (ref 0.35–4.50)

## 2015-08-15 LAB — T4, FREE: FREE T4: 0.81 ng/dL (ref 0.60–1.60)

## 2015-08-15 MED ORDER — METHIMAZOLE 5 MG PO TABS: 5.0000 mg | ORAL_TABLET | Freq: Every day | ORAL | Status: DC

## 2015-08-15 NOTE — Progress Notes (Signed)
Subjective:    Patient ID: Jeremy Deleon, male    DOB: [birth year], 82 y.o.   MRN: [id number]  HPI Pt returns for f/u of hyperthyroidism (dx'ed in Nov of 2016; he has never had dedicated thyroid imaging, but Grave's dz is presumed; tapazole is chosen as rx, due to the need for frequent CT scans with contrast). pt states he feels well in general, except for slight doe. He takes tapazole as rx'ed. Past Medical History  Diagnosis Date  . CAD S/P percutaneous coronary angioplasty January 2006    Abnormal Myoview with inferolateral ischemia: --> 80% prox LAD lesion PCI: Taxus DES 3.0 mm x 16 mm-;; Myoview June 2009 no ischemia or infarction with attenuation artifact  . Hypertension   . Dyslipidemia, goal LDL below 70     on simvastatin and tricor  . Osteoarthritis   . BPH (benign prostatic hyperplasia)   . GERD (gastroesophageal reflux disease)   . Squamous cell lung cancer : Left lower lobe  08/03/2014    Bronchoscopy showed extrinsic compression of the left lower lobe with endobronchial biopsies were negative. He subsequently had a needle biopsy which diagnosed the mass as squamous cell carcinoma   . Full code status 04/23/2015    Past Surgical History  Procedure Laterality Date  . Nm myoview ltd  07/2004    inferolatera wall ischemia  . Nm myocar perf wall motion  January 06, 2008     treadmill  myoview - tissue attenuation but no ischemia or infarction  . Doppler echocardiography  June 18 ,2009    mild concentric LVH, NORMAL ef, MILDLY  DILATED LEFT ATRIUM  AND MILD SCLEROTIC  AORTIC  VALVE AND OTHERWISE  NORMAL  . Cardiac catheterization  07/2004    80% proximal LAD lesion--PCI, EF 60%  . Percutaneous coronary stent intervention (pci-s)  2/27/ 2006    TAXUS DES 3.0 mm x 16 mm stent ->  PROX  LAD  . Renal doppler  05/03/2007    rgt and lft renal arteries normal patency; rgt and lft kidneys normal size  . Umbilical hernia repair    . Appendectomy    . Cataract extraction    . Video  assisted thoracoscopy (vats)/ lobectomy Left 08/10/2014    Procedure: VIDEO ASSISTED THORACOSCOPY (VATS)/ LOBECTOMY;  Surgeon: Ivin Poot, MD;  Location: [facility];  Service: Thoracic;  Laterality: Left;  . Video bronchoscopy N/A 08/10/2014    Procedure: VIDEO BRONCHOSCOPY;  Surgeon: Ivin Poot, MD;  Location: [facility] OR;  Service: Thoracic;  Laterality: N/A;    Social History   Social History  . Marital Status: Single    Spouse Name: N/A  . Number of Children: N/A  . Years of Education: N/A   Occupational History  . Not on file.   Social History Main Topics  . Smoking status: Former Smoker    Types: Cigarettes    Quit date: 10/25/1982  . Smokeless tobacco: Not on file  . Alcohol Use: No  . Drug Use: No  . Sexual Activity: Not on file   Other Topics Concern  . Not on file   Social History Narrative    He is a widowed father of 41, grandfather of 44, still working. Does not smoke, quit 30   years ago and does not drink alcohol. He still works in his Nurse, learning disability for [employer].    Current Outpatient Prescriptions on File Prior to Visit  Medication Sig Dispense Refill  . acetaminophen (TYLENOL)  325 MG tablet Take 650 mg by mouth every 6 (six) hours as needed (pain).    Marland Kitchen aspirin EC 325 MG tablet Take 325 mg by mouth daily.    . fenofibrate (TRICOR) 145 MG tablet Take 1 tablet (145 mg total) by mouth daily. 90 tablet 1  . isosorbide mononitrate (IMDUR) 30 MG 24 hr tablet TAKE 1 TABLET BY MOUTH DAILY. 90 tablet 1  . lactulose (CHRONULAC) 10 GM/15ML solution Take 30 mLs (20 g total) by mouth daily as needed for mild constipation. 240 mL 1  . metoprolol succinate (TOPROL-XL) 25 MG 24 hr tablet TAKE 1 TABLET BY MOUTH DAILY. 90 tablet 1  . prochlorperazine (COMPAZINE) 10 MG tablet Take 1 tablet (10 mg total) by mouth every 6 (six) hours as needed for nausea or vomiting. 30 tablet 0  . simvastatin (ZOCOR) 40 MG tablet Take 1 tablet (40 mg total) by mouth daily.  90 tablet 1  . telmisartan-hydrochlorothiazide (MICARDIS HCT) 40-12.5 MG per tablet Take 0.5 tablets by mouth daily. 90 tablet 3  . traMADol (ULTRAM) 50 MG tablet Take 50 mg by mouth every 6 (six) hours as needed for moderate pain.     No current facility-administered medications on file prior to visit.    Allergies  Allergen Reactions  . Niaspan [Niacin Er] Other (See Comments)    flushing    Family History  Problem Relation Age of Onset  . Cancer Mother   . Heart disease Mother   . Heart disease Father   . Parkinsonism Brother   . Thyroid disease Neg Hx     BP 122/64 mmHg  Pulse 112  Temp(Src) 97.8 F (36.6 C) (Oral)  Ht '6\' 2"'$  (1.88 m)  Wt 215 lb (97.523 kg)  BMI 27.59 kg/m2  SpO2 97%  Review of Systems Denies fever    Objective:   Physical Exam VITAL SIGNS:  See vs page GENERAL: no distress Skin: not diaphoretic Neuro: no tremor   Lab Results  Component Value Date   TSH 0.33* 08/15/2015      Assessment & Plan:  Hyperthyroidism, improved on rx.   Patient is advised the following: Patient Instructions  Thyroid blood tests are requested for you today.  We'll let you know about the results. We'll adjust the methimazole as we need to.  if ever you have fever while taking methimazole, stop it and call us, even if the reason is obvious, because of the risk of a rare side-effect. Please come back for a follow-up appointment in 6 weeks.    addendum: reduce tapazole to 5 mg qd.

## 2015-08-15 NOTE — Patient Instructions (Addendum)
Thyroid blood tests are requested for you today.  We'll let you know about the results. We'll adjust the methimazole as we need to.  if ever you have fever while taking methimazole, stop it and call us, even if the reason is obvious, because of the risk of a rare side-effect. Please come back for a follow-up appointment in 6 weeks.

## 2015-08-18 ENCOUNTER — Other Ambulatory Visit: Payer: Self-pay | Admitting: Cardiology

## 2015-08-20 NOTE — Telephone Encounter (Signed)
Rx request sent to pharmacy.  

## 2015-08-21 ENCOUNTER — Other Ambulatory Visit (HOSPITAL_BASED_OUTPATIENT_CLINIC_OR_DEPARTMENT_OTHER): Payer: Medicare Other

## 2015-08-21 ENCOUNTER — Ambulatory Visit (HOSPITAL_COMMUNITY)
Admission: RE | Admit: 2015-08-21 | Discharge: 2015-08-21 | Disposition: A | Discharge status: Home / Self Care | Payer: Medicare Other | Source: Ambulatory Visit | Attending: Internal Medicine | Admitting: Internal Medicine

## 2015-08-21 DIAGNOSIS — C3432 Malignant neoplasm of lower lobe, left bronchus or lung: Secondary | ICD-10-CM

## 2015-08-21 DIAGNOSIS — Z789 Other specified health status: Secondary | ICD-10-CM

## 2015-08-21 DIAGNOSIS — C3492 Malignant neoplasm of unspecified part of left bronchus or lung: Secondary | ICD-10-CM

## 2015-08-21 DIAGNOSIS — Z9221 Personal history of antineoplastic chemotherapy: Secondary | ICD-10-CM | POA: Diagnosis not present

## 2015-08-21 LAB — CBC WITH DIFFERENTIAL/PLATELET
BASO%: 0.7 % (ref 0.0–2.0)
Basophils Absolute: 0 10*3/uL (ref 0.0–0.1)
EOS%: 5.9 % (ref 0.0–7.0)
Eosinophils Absolute: 0.4 10*3/uL (ref 0.0–0.5)
HEMATOCRIT: 45.7 % (ref 38.4–49.9)
HEMOGLOBIN: 15.2 g/dL (ref 13.0–17.1)
LYMPH#: 2.2 10*3/uL (ref 0.9–3.3)
LYMPH%: 35.1 % (ref 14.0–49.0)
MCH: 30.3 pg (ref 27.2–33.4)
MCHC: 33.3 g/dL (ref 32.0–36.0)
MCV: 91.2 fL (ref 79.3–98.0)
MONO#: 0.6 10*3/uL (ref 0.1–0.9)
MONO%: 9.6 % (ref 0.0–14.0)
NEUT%: 48.7 % (ref 39.0–75.0)
NEUTROS ABS: 3 10*3/uL (ref 1.5–6.5)
PLATELETS: 180 10*3/uL (ref 140–400)
RBC: 5.01 10*6/uL (ref 4.20–5.82)
RDW: 14.8 % — AB (ref 11.0–14.6)
WBC: 6.2 10*3/uL (ref 4.0–10.3)

## 2015-08-21 LAB — COMPREHENSIVE METABOLIC PANEL
ALBUMIN: 3.9 g/dL (ref 3.5–5.0)
ALT: 11 U/L (ref 0–55)
ANION GAP: 10 meq/L (ref 3–11)
AST: 20 U/L (ref 5–34)
Alkaline Phosphatase: 87 U/L (ref 40–150)
BILIRUBIN TOTAL: 0.5 mg/dL (ref 0.20–1.20)
BUN: 19.2 mg/dL (ref 7.0–26.0)
CALCIUM: 9.6 mg/dL (ref 8.4–10.4)
CHLORIDE: 105 meq/L (ref 98–109)
CO2: 26 mEq/L (ref 22–29)
CREATININE: 1.2 mg/dL (ref 0.7–1.3)
EGFR: 55 mL/min/{1.73_m2} — ABNORMAL LOW (ref >90)
Glucose: 84 mg/dl (ref 70–140)
Potassium: 4.3 mEq/L (ref 3.5–5.1)
Sodium: 141 mEq/L (ref 136–145)
TOTAL PROTEIN: 7.3 g/dL (ref 6.4–8.3)

## 2015-08-21 MED ORDER — IOHEXOL 300 MG/ML  SOLN
75.0000 mL | Freq: Once | INTRAMUSCULAR | Status: AC | PRN
  Administered 2015-08-21: 75 mL via INTRAVENOUS

## 2015-08-28 ENCOUNTER — Encounter: Payer: Self-pay | Admitting: Internal Medicine

## 2015-08-28 ENCOUNTER — Telehealth: Payer: Self-pay | Admitting: Internal Medicine

## 2015-08-28 ENCOUNTER — Ambulatory Visit (HOSPITAL_BASED_OUTPATIENT_CLINIC_OR_DEPARTMENT_OTHER): Payer: Medicare Other | Admitting: Internal Medicine

## 2015-08-28 VITALS — BP 136/57 | HR 69 | Temp 97.1°F | Resp 19 | Ht 74.0 in | Wt 216.2 lb

## 2015-08-28 DIAGNOSIS — C3432 Malignant neoplasm of lower lobe, left bronchus or lung: Secondary | ICD-10-CM | POA: Diagnosis not present

## 2015-08-28 DIAGNOSIS — C3492 Malignant neoplasm of unspecified part of left bronchus or lung: Secondary | ICD-10-CM

## 2015-08-28 NOTE — Progress Notes (Signed)
[place] [phone]   [phone]) [phone]  OFFICE PROGRESS NOTE  [name], MD [place] [address]  DIAGNOSIS: Stage IIA (T2b, N0, M0) non-small cell lung cancer consistent with squamous cell carcinoma diagnosed in January 2016  PRIOR THERAPY:  1) Status post left VATS with left lower lobectomy and mediastinal lymph node dissection under the care of Dr. Prescott Gum on 08/10/2014. 2) Adjuvant systemic chemotherapy with carboplatin for AUC of 5 on day 1 and gemcitabine 1000 MG/M2 on days 1 and 8 every 3 weeks. First dose 09/19/2014.  CURRENT THERAPY: Observation.  INTERVAL HISTORY: Jeremy Deleon 82 y.o. male returns to the clinic today for follow-up visit. The patient is feeling fine today with no specific complaints. He has been on observation for more than 6 months months.  He denied having any significant fever or chills, no nausea or vomiting. The patient denied having any significant weight loss or night sweats. He has no chest pain, shortness of breath, cough or hemoptysis. He had repeat CT scan of the chest performed recently and he is here for evaluation and discussion of his scan results.  MEDICAL HISTORY: Past Medical History  Diagnosis Date  . CAD S/P percutaneous coronary angioplasty January 2006    Abnormal Myoview with inferolateral ischemia: --> 80% prox LAD lesion PCI: Taxus DES 3.0 mm x 16 mm-;; Myoview June 2009 no ischemia or infarction with attenuation artifact  . Hypertension   . Dyslipidemia, goal LDL below 70     on simvastatin and tricor  . Osteoarthritis   . BPH (benign prostatic hyperplasia)   . GERD (gastroesophageal reflux disease)   . Squamous cell lung cancer : Left lower lobe  08/03/2014    Bronchoscopy showed extrinsic compression of the left lower lobe with endobronchial biopsies were negative. He subsequently had a needle biopsy which diagnosed the mass as squamous cell  carcinoma   . Full code status 04/23/2015    ALLERGIES:  is allergic to niaspan.  MEDICATIONS:  Current Outpatient Prescriptions  Medication Sig Dispense Refill  . acetaminophen (TYLENOL) 325 MG tablet Take 650 mg by mouth every 6 (six) hours as needed (pain).    Marland Kitchen aspirin EC 325 MG tablet Take 325 mg by mouth daily.    . fenofibrate (TRICOR) 145 MG tablet TAKE 1 TABLET BY MOUTH EVERY DAY 30 tablet 0  . isosorbide mononitrate (IMDUR) 30 MG 24 hr tablet TAKE 1 TABLET BY MOUTH DAILY. 90 tablet 1  . lactulose (CHRONULAC) 10 GM/15ML solution Take 30 mLs (20 g total) by mouth daily as needed for mild constipation. 240 mL 1  . methimazole (TAPAZOLE) 5 MG tablet Take 1 tablet (5 mg total) by mouth daily. 30 tablet 1  . metoprolol succinate (TOPROL-XL) 25 MG 24 hr tablet TAKE 1 TABLET BY MOUTH DAILY. 90 tablet 1  . prochlorperazine (COMPAZINE) 10 MG tablet Take 1 tablet (10 mg total) by mouth every 6 (six) hours as needed for nausea or vomiting. 30 tablet 0  . simvastatin (ZOCOR) 40 MG tablet TAKE 1 TABLET BY MOUTH EVERY DAY 30 tablet 0  . telmisartan-hydrochlorothiazide (MICARDIS HCT) 40-12.5 MG per tablet Take 0.5 tablets by mouth daily. 90 tablet 3  . traMADol (ULTRAM) 50 MG tablet Take 50 mg by mouth every 6 (six) hours as needed for moderate pain.     No current facility-administered medications for this visit.    SURGICAL HISTORY:  Past Surgical History  Procedure Laterality Date  . Nm myoview ltd  07/2004    inferolatera wall ischemia  . Nm myocar perf wall motion  January 06, 2008     treadmill  myoview - tissue attenuation but no ischemia or infarction  . Doppler echocardiography  June 18 ,2009    mild concentric LVH, NORMAL ef, MILDLY  DILATED LEFT ATRIUM  AND MILD SCLEROTIC  AORTIC  VALVE AND OTHERWISE  NORMAL  . Cardiac catheterization  07/2004    80% proximal LAD lesion--PCI, EF 60%  . Percutaneous coronary stent intervention (pci-s)  2/27/ 2006    TAXUS DES 3.0 mm x 16 mm stent ->   PROX  LAD  . Renal doppler  05/03/2007    rgt and lft renal arteries normal patency; rgt and lft kidneys normal size  . Umbilical hernia repair    . Appendectomy    . Cataract extraction    . Video assisted thoracoscopy (vats)/ lobectomy Left 08/10/2014    Procedure: VIDEO ASSISTED THORACOSCOPY (VATS)/ LOBECTOMY;  Surgeon: Ivin Poot, MD;  Location: [facility];  Service: Thoracic;  Laterality: Left;  . Video bronchoscopy N/A 08/10/2014    Procedure: VIDEO BRONCHOSCOPY;  Surgeon: Ivin Poot, MD;  Location: [facility];  Service: Thoracic;  Laterality: N/A;    REVIEW OF SYSTEMS:  A comprehensive review of systems was negative.   PHYSICAL EXAMINATION: General appearance: alert, cooperative and no distress Head: Normocephalic, without obvious abnormality, atraumatic Neck: no adenopathy, no JVD, supple, symmetrical, trachea midline and thyroid not enlarged, symmetric, no tenderness/mass/nodules Lymph nodes: Cervical, supraclavicular, and axillary nodes normal. Resp: clear to auscultation bilaterally Back: symmetric, no curvature. ROM normal. No CVA tenderness. Cardio: normal apical impulse GI: soft, non-tender; bowel sounds normal; no masses,  no organomegaly Extremities: extremities normal, atraumatic, no cyanosis or edema Neurologic: Alert and oriented X 3, normal strength and tone. Normal symmetric reflexes. Normal coordination and gait  ECOG PERFORMANCE STATUS: 1 - Symptomatic but completely ambulatory  Blood pressure 136/57, pulse 69, temperature 97.1 F (36.2 C), temperature source Oral, resp. rate 19, height '6\' 2"'$  (1.88 m), weight 216 lb 3.2 oz (98.068 kg), SpO2 98 %.  LABORATORY DATA: Lab Results  Component Value Date   WBC 6.2 08/21/2015   HGB 15.2 08/21/2015   HCT 45.7 08/21/2015   MCV 91.2 08/21/2015   PLT 180 08/21/2015      Chemistry      Component Value Date/Time   NA 141 08/21/2015 0854   NA 140 08/14/2014 0255   K 4.3 08/21/2015 0854   K 3.6 08/14/2014 0255    CL 105 08/14/2014 0255   CO2 26 08/21/2015 0854   CO2 29 08/14/2014 0255   BUN 19.2 08/21/2015 0854   BUN 19 08/14/2014 0255   CREATININE 1.2 08/21/2015 0854   CREATININE 0.96 08/14/2014 0255      Component Value Date/Time   CALCIUM 9.6 08/21/2015 0854   CALCIUM 8.6 08/14/2014 0255   ALKPHOS 87 08/21/2015 0854   ALKPHOS 49 08/14/2014 0255   AST 20 08/21/2015 0854   AST 20 08/14/2014 0255   ALT 11 08/21/2015 0854   ALT 11 08/14/2014 0255   BILITOT 0.50 08/21/2015 0854   BILITOT 0.9 08/14/2014 0255       RADIOGRAPHIC STUDIES: Ct Chest W Contrast  08/21/2015  CLINICAL DATA:  Follow-up lung cancer diagnosed 2016. Chemotherapy complete. EXAM: CT CHEST WITH CONTRAST TECHNIQUE: Multidetector CT imaging of the chest was performed during intravenous contrast administration. CONTRAST:  1m OMNIPAQUE  IOHEXOL 300 MG/ML  SOLN COMPARISON:  CT 04/20/2015 FINDINGS: Mediastinum/Nodes: No axillary or supraclavicular lymphadenopathy. No mediastinal hilar lymphadenopathy. No pericardial fluid. Coronary calcifications are present. Lungs/Pleura: Large calcified granulomas in the RIGHT lung. Volume loss in the LEFT hemi thorax. Mild pleural thickening at the LEFT lung base. No measurable noncalcified nodules. Upper abdomen: Limited view of the liver, kidneys, pancreas are unremarkable. Normal adrenal glands. Small gallstones can gallbladder. Musculoskeletal: No aggressive osseous lesion. Flowing osteophytosis of the thoracic spine. IMPRESSION: 1. No evidence of lung cancer recurrence. 2. Posttherapy changes in the LEFT hemi thorax. Electronically Signed   By: Suzy Bouchard M.D.   On: 08/21/2015 10:23    ASSESSMENT AND PLAN: This is a very pleasant 82 years old white male recently diagnosed with a stage IIA non-small cell lung cancer, squamous cell carcinoma status post left lower lobectomy with lymph node dissection. This was followed by 4 cycles of adjuvant chemotherapy with carboplatin and gemcitabine.  The patient tolerated his treatment fairly well with no significant complaints except for mild fatigue. Has been observation for more than 6 months and the patient is feeling fine. The recent CT scan of the chest showed no evidence for disease recurrence. I discussed the scan results with the patient today. I recommended for him to continue on observation with repeat CT scan of the chest in 6 months. CODE STATUS was discussed with the patient today and he requested full CODE STATUS but no prolonged resuscitation if he has a vegetative state. He was advised to call immediately if he has any concerning symptoms in the interval. The patient voices understanding of current disease status and treatment options and is in agreement with the current care plan.  All questions were answered. The patient knows to call the clinic with any problems, questions or concerns. We can certainly see the patient much sooner if necessary.  Disclaimer: This note was dictated with voice recognition software. Similar sounding words can inadvertently be transcribed and may not be corrected upon review.

## 2015-08-28 NOTE — Telephone Encounter (Signed)
Pt confirmed labs/ov per 02/07 POF, gave pt AVS and Calendar... KJ °

## 2015-09-26 ENCOUNTER — Encounter: Payer: Self-pay | Admitting: Endocrinology

## 2015-09-26 ENCOUNTER — Ambulatory Visit (INDEPENDENT_AMBULATORY_CARE_PROVIDER_SITE_OTHER): Payer: Medicare Other | Admitting: Endocrinology

## 2015-09-26 VITALS — BP 122/80 | HR 73 | Temp 97.9°F | Ht 74.0 in | Wt 214.0 lb

## 2015-09-26 DIAGNOSIS — E0590 Thyrotoxicosis, unspecified without thyrotoxic crisis or storm: Secondary | ICD-10-CM | POA: Diagnosis not present

## 2015-09-26 LAB — T4, FREE: Free T4: 0.71 ng/dL (ref 0.60–1.60)

## 2015-09-26 LAB — TSH: TSH: 0.46 u[IU]/mL (ref 0.35–4.50)

## 2015-09-26 NOTE — Progress Notes (Signed)
Subjective:    Patient ID: Jeremy Deleon, male    DOB: 1933/05/27, 82 y.o.   MRN: [id number]  HPI Pt returns for f/u of hyperthyroidism (dx'ed in Nov of 2016; he has never had dedicated thyroid imaging, but Grave's dz is presumed; tapazole is chosen as rx, due to the need for frequent CT scans with contrast). pt states he feels well in general, except for bilat knee pain. He takes tapazole as rx'ed.   Past Medical History  Diagnosis Date  . CAD S/P percutaneous coronary angioplasty January 2006    Abnormal Myoview with inferolateral ischemia: --> 80% prox LAD lesion PCI: Taxus DES 3.0 mm x 16 mm-;; Myoview June 2009 no ischemia or infarction with attenuation artifact  . Hypertension   . Dyslipidemia, goal LDL below 70     on simvastatin and tricor  . Osteoarthritis   . BPH (benign prostatic hyperplasia)   . GERD (gastroesophageal reflux disease)   . Squamous cell lung cancer : Left lower lobe  08/03/2014    Bronchoscopy showed extrinsic compression of the left lower lobe with endobronchial biopsies were negative. He subsequently had a needle biopsy which diagnosed the mass as squamous cell carcinoma   . Full code status 04/23/2015    Past Surgical History  Procedure Laterality Date  . Nm myoview ltd  07/2004    inferolatera wall ischemia  . Nm myocar perf wall motion  January 06, 2008     treadmill  myoview - tissue attenuation but no ischemia or infarction  . Doppler echocardiography  June 18 ,2009    mild concentric LVH, NORMAL ef, MILDLY  DILATED LEFT ATRIUM  AND MILD SCLEROTIC  AORTIC  VALVE AND OTHERWISE  NORMAL  . Cardiac catheterization  07/2004    80% proximal LAD lesion--PCI, EF 60%  . Percutaneous coronary stent intervention (pci-s)  2/27/ 2006    TAXUS DES 3.0 mm x 16 mm stent ->  PROX  LAD  . Renal doppler  05/03/2007    rgt and lft renal arteries normal patency; rgt and lft kidneys normal size  . Umbilical hernia repair    . Appendectomy    . Cataract extraction    . Video  assisted thoracoscopy (vats)/ lobectomy Left 08/10/2014    Procedure: VIDEO ASSISTED THORACOSCOPY (VATS)/ LOBECTOMY;  Surgeon: Ivin Poot, MD;  Location: [facility];  Service: Thoracic;  Laterality: Left;  . Video bronchoscopy N/A 08/10/2014    Procedure: VIDEO BRONCHOSCOPY;  Surgeon: Ivin Poot, MD;  Location: [facility] OR;  Service: Thoracic;  Laterality: N/A;    Social History   Social History  . Marital Status: Single    Spouse Name: N/A  . Number of Children: N/A  . Years of Education: N/A   Occupational History  . Not on file.   Social History Main Topics  . Smoking status: Former Smoker    Types: Cigarettes    Quit date: 10/25/1982  . Smokeless tobacco: Not on file  . Alcohol Use: No  . Drug Use: No  . Sexual Activity: Not on file   Other Topics Concern  . Not on file   Social History Narrative    He is a widowed father of 78, grandfather of 66, still working. Does not smoke, quit 30   years ago and does not drink alcohol. He still works in his Nurse, learning disability for [employer].    Current Outpatient Prescriptions on File Prior to Visit  Medication Sig Dispense Refill  .  acetaminophen (TYLENOL) 325 MG tablet Take 650 mg by mouth every 6 (six) hours as needed (pain).    Marland Kitchen aspirin EC 325 MG tablet Take 325 mg by mouth daily.    . fenofibrate (TRICOR) 145 MG tablet TAKE 1 TABLET BY MOUTH EVERY DAY 30 tablet 0  . isosorbide mononitrate (IMDUR) 30 MG 24 hr tablet TAKE 1 TABLET BY MOUTH DAILY. 90 tablet 1  . lactulose (CHRONULAC) 10 GM/15ML solution Take 30 mLs (20 g total) by mouth daily as needed for mild constipation. 240 mL 1  . methimazole (TAPAZOLE) 5 MG tablet Take 1 tablet (5 mg total) by mouth daily. 30 tablet 1  . metoprolol succinate (TOPROL-XL) 25 MG 24 hr tablet TAKE 1 TABLET BY MOUTH DAILY. 90 tablet 1  . prochlorperazine (COMPAZINE) 10 MG tablet Take 1 tablet (10 mg total) by mouth every 6 (six) hours as needed for nausea or vomiting. 30 tablet 0    . simvastatin (ZOCOR) 40 MG tablet TAKE 1 TABLET BY MOUTH EVERY DAY 30 tablet 0  . telmisartan-hydrochlorothiazide (MICARDIS HCT) 40-12.5 MG per tablet Take 0.5 tablets by mouth daily. 90 tablet 3  . traMADol (ULTRAM) 50 MG tablet Take 50 mg by mouth every 6 (six) hours as needed for moderate pain.     No current facility-administered medications on file prior to visit.    Allergies  Allergen Reactions  . Niaspan [Niacin Er] Other (See Comments)    flushing    Family History  Problem Relation Age of Onset  . Cancer Mother   . Heart disease Mother   . Heart disease Father   . Parkinsonism Brother   . Thyroid disease Neg Hx     BP 122/80 mmHg  Pulse 73  Temp(Src) 97.9 F (36.6 C) (Oral)  Ht '6\' 2"'$  (1.88 m)  Wt 214 lb (97.07 kg)  BMI 27.46 kg/m2  SpO2 95%  Review of Systems Denies fever    Objective:   Physical Exam VITAL SIGNS:  See vs page GENERAL: no distress NECK: There is no palpable thyroid enlargement.  No thyroid nodule is palpable.  No palpable lymphadenopathy at the anterior neck.    Lab Results  Component Value Date   TSH 0.46 09/26/2015      Assessment & Plan:  Hyperthyroidism: well-controlled  Patient is advised the following: Patient Instructions  Thyroid blood tests are requested for you today.  We'll let you know about the results. We'll continue to adjust the methimazole as we need to.  if ever you have fever while taking methimazole, stop it and call us, even if the reason is obvious, because of the risk of a rare side-effect. Please come back for a follow-up appointment in 2 months.    addendum: Please continue the same medication.

## 2015-09-26 NOTE — Patient Instructions (Addendum)
Thyroid blood tests are requested for you today.  We'll let you know about the results. We'll continue to adjust the methimazole as we need to.  if ever you have fever while taking methimazole, stop it and call us, even if the reason is obvious, because of the risk of a rare side-effect. Please come back for a follow-up appointment in 2 months.

## 2015-10-29 ENCOUNTER — Telehealth: Payer: Self-pay | Admitting: Cardiology

## 2015-10-29 NOTE — Telephone Encounter (Signed)
Returned call to pt. Pt stated he had a missed call from the office but no message. Cannot find anywhere that the office had called. Pt does have upcoming appt for 10/31/15. Pt verbalized understanding of that appt. Pt has no complaints at this time and will be here on Wednesday for the appt.

## 2015-10-29 NOTE — Telephone Encounter (Signed)
New message   Pt said he is returning a call from Dr. Debara Pickett but he dont know why/ or if he had the wrong number

## 2015-10-31 ENCOUNTER — Ambulatory Visit (INDEPENDENT_AMBULATORY_CARE_PROVIDER_SITE_OTHER): Payer: Medicare Other | Admitting: Cardiology

## 2015-10-31 ENCOUNTER — Encounter: Payer: Self-pay | Admitting: Cardiology

## 2015-10-31 VITALS — BP 136/64 | HR 74 | Ht 73.0 in | Wt 217.8 lb

## 2015-10-31 DIAGNOSIS — I2510 Atherosclerotic heart disease of native coronary artery without angina pectoris: Secondary | ICD-10-CM | POA: Diagnosis not present

## 2015-10-31 DIAGNOSIS — Z9861 Coronary angioplasty status: Secondary | ICD-10-CM | POA: Diagnosis not present

## 2015-10-31 DIAGNOSIS — E785 Hyperlipidemia, unspecified: Secondary | ICD-10-CM | POA: Diagnosis not present

## 2015-10-31 DIAGNOSIS — I951 Orthostatic hypotension: Secondary | ICD-10-CM | POA: Diagnosis not present

## 2015-10-31 DIAGNOSIS — I10 Essential (primary) hypertension: Secondary | ICD-10-CM | POA: Diagnosis not present

## 2015-10-31 NOTE — Patient Instructions (Signed)

## 2015-10-31 NOTE — Progress Notes (Signed)
PCP: [name], MD  Clinic Note: Chief Complaint  Patient presents with  . Follow-up  . Knee Pain    cramping in knees  . Coronary Artery Disease    HPI: Jeremy Deleon is a 82 y.o. male with a PMH below who presents today for postop visit after having partial left lower lobectomy by Dr. Tharon Aquas Trigt.  He is also completed his chemotherapy. The next plan is to do reevaluation in October. I saw on after his initial VATS guided biopsy in Jan 2016, and he was having issues with profound dizziness likely related to orthostatic hypotension.   I last saw him in June 2016 - doing well. Lung Cancer doing well - due for 6 month f/u in Aug. -- continues to get stronger and have better energy levels.  Recent studies/hospitalizations  no new studies or procedures from a cardiac standpoint  No recent hospitalizations  Interval History: Doing well .  No further spells. No syncope, TIA or murmurs fugax. He still has some mild positional dizziness, but nothing significant. Positional dizziness - if bends over & stands up quickly.  Otherwise stable. He continues to be relatively active is much as he can with his knee pain. Has no major cardiovascular complaints.  Does not describe PND, or orthopnea. He does have stable mild bilateral lower leg edema.   He denies chest pain with rest or exertion. No palpitations, TIA/amaurosis fugax symptoms, syncope/neasr syncopeNomelena, hematochezia, hematuria, or epstaxis. No claudication.  ROS: A comprehensive was performed. Review of Systems  Constitutional: Negative for weight loss (pretty stable -) and malaise/fatigue (energy is gradually getting better).  HENT: Negative for congestion.   Respiratory: Negative for cough (couple times in AM). Shortness of breath: a bit @ baseline since surgery -    Cardiovascular: Positive for leg swelling. Negative for claudication.       Per HPI  Gastrointestinal: Negative for nausea (some with chemo),  vomiting, blood in stool and melena.  Genitourinary: Positive for frequency (multple x per night - nocturia). Negative for hematuria.  Musculoskeletal: Positive for joint pain (Mostly left knee pain and swelling).  Neurological: Positive for dizziness (Sounds more like vertigo.  Also has dizziness with bending over and standing up quickly.). Negative for weakness and headaches.  Endo/Heme/Allergies: Bruises/bleeds easily.  Psychiatric/Behavioral: Positive for depression. The patient does not have insomnia.   All other systems reviewed and are negative.  Past Medical History  Diagnosis Date  . CAD S/P percutaneous coronary angioplasty January 2006    Abnormal Myoview with inferolateral ischemia: --> 80% prox LAD lesion PCI: Taxus DES 3.0 mm x 16 mm-;; Myoview June 2009 no ischemia or infarction with attenuation artifact  . Hypertension   . Dyslipidemia, goal LDL below 70     on simvastatin and tricor  . Osteoarthritis   . BPH (benign prostatic hyperplasia)   . GERD (gastroesophageal reflux disease)   . Squamous cell lung cancer : Left lower lobe  08/03/2014    Bronchoscopy showed extrinsic compression of the left lower lobe with endobronchial biopsies were negative. He subsequently had a needle biopsy which diagnosed the mass as squamous cell carcinoma   . Full code status 04/23/2015   Past Surgical History  Procedure Laterality Date  . Nm myoview ltd  07/2004    inferolatera wall ischemia  . Nm myocar perf wall motion  January 06, 2008     treadmill  myoview - tissue attenuation but no ischemia or infarction  . Doppler echocardiography  June 18 ,2009    mild concentric LVH, NORMAL ef, MILDLY  DILATED LEFT ATRIUM  AND MILD SCLEROTIC  AORTIC  VALVE AND OTHERWISE  NORMAL  . Cardiac catheterization  07/2004    80% proximal LAD lesion--PCI, EF 60%  . Percutaneous coronary stent intervention (pci-s)  2/27/ 2006    TAXUS DES 3.0 mm x 16 mm stent ->  PROX  LAD  . Renal doppler  05/03/2007    rgt  and lft renal arteries normal patency; rgt and lft kidneys normal size  . Umbilical hernia repair    . Appendectomy    . Cataract extraction    . Video assisted thoracoscopy (vats)/ lobectomy Left 08/10/2014    Procedure: VIDEO ASSISTED THORACOSCOPY (VATS)/ LOBECTOMY;  Surgeon: Ivin Poot, MD;  Location: [facility];  Service: Thoracic;  Laterality: Left;  . Video bronchoscopy N/A 08/10/2014    Procedure: VIDEO BRONCHOSCOPY;  Surgeon: Ivin Poot, MD;  Location: [facility] OR;  Service: Thoracic;  Laterality: N/A;     Current Outpatient Prescriptions on File Prior to Visit  Medication Sig Dispense Refill  . acetaminophen (TYLENOL) 325 MG tablet Take 650 mg by mouth every 6 (six) hours as needed (pain).    Marland Kitchen aspirin EC 325 MG tablet Take 325 mg by mouth daily.    . fenofibrate (TRICOR) 145 MG tablet TAKE 1 TABLET BY MOUTH EVERY DAY 30 tablet 0  . isosorbide mononitrate (IMDUR) 30 MG 24 hr tablet TAKE 1 TABLET BY MOUTH DAILY. 90 tablet 1  . lactulose (CHRONULAC) 10 GM/15ML solution Take 30 mLs (20 g total) by mouth daily as needed for mild constipation. 240 mL 1  . methimazole (TAPAZOLE) 5 MG tablet Take 1 tablet (5 mg total) by mouth daily. 30 tablet 1  . metoprolol succinate (TOPROL-XL) 25 MG 24 hr tablet TAKE 1 TABLET BY MOUTH DAILY. 90 tablet 1  . prochlorperazine (COMPAZINE) 10 MG tablet Take 1 tablet (10 mg total) by mouth every 6 (six) hours as needed for nausea or vomiting. 30 tablet 0  . simvastatin (ZOCOR) 40 MG tablet TAKE 1 TABLET BY MOUTH EVERY DAY 30 tablet 0  . telmisartan-hydrochlorothiazide (MICARDIS HCT) 40-12.5 MG per tablet Take 0.5 tablets by mouth daily. 90 tablet 3  . traMADol (ULTRAM) 50 MG tablet Take 50 mg by mouth every 6 (six) hours as needed for moderate pain.     No current facility-administered medications on file prior to visit.    Allergies  Allergen Reactions  . Niaspan [Niacin Er] Other (See Comments)    flushing    Social History  Substance Use Topics  .  Smoking status: Former Smoker    Types: Cigarettes    Quit date: 10/25/1982  . Smokeless tobacco: None  . Alcohol Use: No   Family History  Problem Relation Age of Onset  . Cancer Mother   . Heart disease Mother   . Heart disease Father   . Parkinsonism Brother   . Thyroid disease Neg Hx     Wt Readings from Last 3 Encounters:  10/31/15 217 lb 12.8 oz (98.793 kg)  09/26/15 214 lb (97.07 kg)  08/28/15 216 lb 3.2 oz (98.068 kg)    PHYSICAL EXAM BP 136/64 mmHg  Pulse 74  Ht '6\' 1"'$  (1.854 m)  Wt 217 lb 12.8 oz (98.793 kg)  BMI 28.74 kg/m2 General appearance: alert, cooperative, appears stated age, appears a bit uncomfortable - queasy..  Looks a bit pale & weak.   Neck: no adenopathy,  no carotid bruit and no JVD Lungs: clear to auscultation bilaterally, normal percussion bilaterally and non-labored Heart: regular rate and rhythm, S1, S2 normal, no murmur, click, rub or gallop ; nondisplaced Abdomen: soft, non-tender; bowel sounds normal; no masses,  no organomegaly; Extremities: extremities normal, atraumatic, no cyanosis, and mild edema  Pulses: roughly 1+ and thready bilaterally Neurologic: Mental status: Alert, oriented, thought content appropriate Cranial nerves: normal (II-XII grossly intact)    Adult ECG Report  Normal sinus rhythm, rate 74 bpm. Nonspecific ST-T wave changes in inferior leads. Otherwise normal axis, intervals and durations. Stable EKG compared to prior studies.  Recent Labs:  Not currently available.  ASSESSMENT / PLAN: Problem List Items Addressed This Visit    Orthostatic hypotension; with baseline hypotension (Chronic)    As a result of this, we have allow for some permissive hypertension. I'm happy with where his blood pressure is currently. He is on low-dose Imdur and Toprol in addition to the Micardis HCT. I think now that he is no longer doing chemotherapy, he probably isn't to be as dehydrated as he was before.Marland Kitchen His blood pressures probably more  stable now.       Essential hypertension (Chronic)    Allowing for mild permissive hypertension, he is well controlled on his current regimen. No changes.      Dyslipidemia, goal LDL below 70 (Chronic)    Now back on TriCor in addition to simvastatin. Labs are followed by PCP. I have not seen recent results. Would be careful with the combination of simvastatin plus fenofibrate. Thankfully no myalgias/arthralgias or problems with LFTs.      CAD S/P percutaneous coronary angioplasty -- Taxus DES in Prox LAD - Primary (Chronic)    Stable with no active anginal symptoms. Remains on aspirin, beta blocker and ARB with statin and TriCor.        Patient Instructions: No change in current medications  Followup:  6 months    HARDING, Leonie Green, M.D., M.S. Interventional Cardiologist   Pager # [phone]

## 2015-11-01 ENCOUNTER — Telehealth: Payer: Self-pay | Admitting: Cardiology

## 2015-11-01 NOTE — Telephone Encounter (Signed)
Call pt back. He stated he got up quickly during the night to void and felt very nauseated and weak. He got back to bed and went back to sleep.  He got up this morning and does not feel nauseated or weak like the episode from during the night. He states he feels fine this morning. He wanted to let us know.

## 2015-11-01 NOTE — Telephone Encounter (Signed)
Pt not feeling well.  Per call pt very nauseated last night. Pt would like a call back today please.

## 2015-11-02 ENCOUNTER — Encounter: Payer: Self-pay | Admitting: Cardiology

## 2015-11-02 NOTE — Assessment & Plan Note (Signed)
Allowing for mild permissive hypertension, he is well controlled on his current regimen. No changes.

## 2015-11-02 NOTE — Assessment & Plan Note (Signed)
As a result of this, we have allow for some permissive hypertension. I'm happy with where his blood pressure is currently. He is on low-dose Imdur and Toprol in addition to the Micardis HCT. I think now that he is no longer doing chemotherapy, he probably isn't to be as dehydrated as he was before.Marland Kitchen His blood pressures probably more stable now.

## 2015-11-02 NOTE — Assessment & Plan Note (Signed)
Stable with no active anginal symptoms. Remains on aspirin, beta blocker and ARB with statin and TriCor.

## 2015-11-02 NOTE — Assessment & Plan Note (Signed)
Now back on TriCor in addition to simvastatin. Labs are followed by PCP. I have not seen recent results. Would be careful with the combination of simvastatin plus fenofibrate. Thankfully no myalgias/arthralgias or problems with LFTs.

## 2015-11-26 ENCOUNTER — Ambulatory Visit (INDEPENDENT_AMBULATORY_CARE_PROVIDER_SITE_OTHER): Payer: Medicare Other | Admitting: Endocrinology

## 2015-11-26 ENCOUNTER — Encounter: Payer: Self-pay | Admitting: Endocrinology

## 2015-11-26 VITALS — BP 110/62 | HR 76 | Ht 73.0 in | Wt 214.6 lb

## 2015-11-26 DIAGNOSIS — E0590 Thyrotoxicosis, unspecified without thyrotoxic crisis or storm: Secondary | ICD-10-CM

## 2015-11-26 LAB — TSH: TSH: 2.38 u[IU]/mL (ref 0.35–4.50)

## 2015-11-26 LAB — T4, FREE: Free T4: 0.62 ng/dL (ref 0.60–1.60)

## 2015-11-26 NOTE — Progress Notes (Signed)
Subjective:    Patient ID: Jeremy Deleon, male    DOB: Oct 29, 1932, 82 y.o.   MRN: [id number]  HPI Pt returns for f/u of hyperthyroidism (dx'ed in Nov of 2016; he has never had dedicated thyroid imaging, but Grave's dz is presumed; tapazole is chosen as rx, due to the need for frequent CT scans with contrast). pt states he feels well in general. He takes tapazole as rx'ed.  Past Medical History  Diagnosis Date  . CAD S/P percutaneous coronary angioplasty January 2006    Abnormal Myoview with inferolateral ischemia: --> 80% prox LAD lesion PCI: Taxus DES 3.0 mm x 16 mm-;; Myoview June 2009 no ischemia or infarction with attenuation artifact  . Hypertension   . Dyslipidemia, goal LDL below 70     on simvastatin and tricor  . Osteoarthritis   . BPH (benign prostatic hyperplasia)   . GERD (gastroesophageal reflux disease)   . Squamous cell lung cancer : Left lower lobe  08/03/2014    Bronchoscopy showed extrinsic compression of the left lower lobe with endobronchial biopsies were negative. He subsequently had a needle biopsy which diagnosed the mass as squamous cell carcinoma   . Full code status 04/23/2015    Past Surgical History  Procedure Laterality Date  . Nm myoview ltd  07/2004    inferolatera wall ischemia  . Nm myocar perf wall motion  January 06, 2008     treadmill  myoview - tissue attenuation but no ischemia or infarction  . Doppler echocardiography  June 18 ,2009    mild concentric LVH, NORMAL ef, MILDLY  DILATED LEFT ATRIUM  AND MILD SCLEROTIC  AORTIC  VALVE AND OTHERWISE  NORMAL  . Cardiac catheterization  07/2004    80% proximal LAD lesion--PCI, EF 60%  . Percutaneous coronary stent intervention (pci-s)  2/27/ 2006    TAXUS DES 3.0 mm x 16 mm stent ->  PROX  LAD  . Renal doppler  05/03/2007    rgt and lft renal arteries normal patency; rgt and lft kidneys normal size  . Umbilical hernia repair    . Appendectomy    . Cataract extraction    . Video assisted thoracoscopy  (vats)/ lobectomy Left 08/10/2014    Procedure: VIDEO ASSISTED THORACOSCOPY (VATS)/ LOBECTOMY;  Surgeon: Ivin Poot, MD;  Location: [place];  Service: Thoracic;  Laterality: Left;  . Video bronchoscopy N/A 08/10/2014    Procedure: VIDEO BRONCHOSCOPY;  Surgeon: Ivin Poot, MD;  Location: [facility] OR;  Service: Thoracic;  Laterality: N/A;    Social History   Social History  . Marital Status: Single    Spouse Name: N/A  . Number of Children: N/A  . Years of Education: N/A   Occupational History  . Not on file.   Social History Main Topics  . Smoking status: Former Smoker    Types: Cigarettes    Quit date: 10/25/1982  . Smokeless tobacco: Not on file  . Alcohol Use: No  . Drug Use: No  . Sexual Activity: Not on file   Other Topics Concern  . Not on file   Social History Narrative    He is a widowed father of 73, grandfather of 15, still working. Does not smoke, quit 30   years ago and does not drink alcohol. He still works in his Nurse, learning disability for [employer].    Current Outpatient Prescriptions on File Prior to Visit  Medication Sig Dispense Refill  . acetaminophen (TYLENOL) 325 MG tablet  Take 650 mg by mouth every 6 (six) hours as needed (pain).    Marland Kitchen aspirin EC 325 MG tablet Take 325 mg by mouth daily.    . fenofibrate (TRICOR) 145 MG tablet TAKE 1 TABLET BY MOUTH EVERY DAY 30 tablet 0  . isosorbide mononitrate (IMDUR) 30 MG 24 hr tablet TAKE 1 TABLET BY MOUTH DAILY. 90 tablet 1  . lactulose (CHRONULAC) 10 GM/15ML solution Take 30 mLs (20 g total) by mouth daily as needed for mild constipation. 240 mL 1  . methimazole (TAPAZOLE) 5 MG tablet Take 1 tablet (5 mg total) by mouth daily. 30 tablet 1  . metoprolol succinate (TOPROL-XL) 25 MG 24 hr tablet TAKE 1 TABLET BY MOUTH DAILY. 90 tablet 1  . prochlorperazine (COMPAZINE) 10 MG tablet Take 1 tablet (10 mg total) by mouth every 6 (six) hours as needed for nausea or vomiting. 30 tablet 0  . simvastatin  (ZOCOR) 40 MG tablet TAKE 1 TABLET BY MOUTH EVERY DAY 30 tablet 0  . telmisartan-hydrochlorothiazide (MICARDIS HCT) 40-12.5 MG per tablet Take 0.5 tablets by mouth daily. 90 tablet 3  . traMADol (ULTRAM) 50 MG tablet Take 50 mg by mouth every 6 (six) hours as needed for moderate pain.     No current facility-administered medications on file prior to visit.    Allergies  Allergen Reactions  . Niaspan [Niacin Er] Other (See Comments)    flushing    Family History  Problem Relation Age of Onset  . Cancer Mother   . Heart disease Mother   . Heart disease Father   . Parkinsonism Brother   . Thyroid disease Neg Hx     BP 110/62 mmHg  Pulse 76  Ht '6\' 1"'$  (1.854 m)  Wt 214 lb 9.6 oz (97.342 kg)  BMI 28.32 kg/m2  SpO2 97%  Review of Systems Denies fever    Objective:   Physical Exam VITAL SIGNS:  See vs page GENERAL: no distress NECK: There is no palpable thyroid enlargement.  No thyroid nodule is palpable.  No palpable lymphadenopathy at the anterior neck.  Lab Results  Component Value Date   TSH 2.38 11/26/2015      Assessment & Plan:  Hyperthyroidism: well-controlled.    Patient is advised the following: Patient Instructions  Thyroid blood tests are requested for you today.  We'll let you know about the results. if ever you have fever while taking methimazole, stop it and call us, even if the reason is obvious, because of the risk of a rare side-effect. Please come back for a follow-up appointment in 4 months.

## 2015-11-26 NOTE — Patient Instructions (Addendum)
Thyroid blood tests are requested for you today.  We'll let you know about the results. if ever you have fever while taking methimazole, stop it and call us, even if the reason is obvious, because of the risk of a rare side-effect. Please come back for a follow-up appointment in 4 months.

## 2015-12-19 ENCOUNTER — Other Ambulatory Visit: Payer: Self-pay | Admitting: Endocrinology

## 2015-12-20 ENCOUNTER — Encounter (HOSPITAL_COMMUNITY): Payer: Self-pay | Admitting: *Deleted

## 2015-12-20 ENCOUNTER — Ambulatory Visit (HOSPITAL_COMMUNITY)
Admission: EM | Admit: 2015-12-20 | Discharge: 2015-12-20 | Disposition: A | Discharge status: Home / Self Care | Payer: Medicare Other | Attending: Emergency Medicine | Admitting: Emergency Medicine

## 2015-12-20 DIAGNOSIS — J069 Acute upper respiratory infection, unspecified: Secondary | ICD-10-CM | POA: Diagnosis not present

## 2015-12-20 DIAGNOSIS — B9789 Other viral agents as the cause of diseases classified elsewhere: Principal | ICD-10-CM

## 2015-12-20 MED ORDER — IPRATROPIUM BROMIDE 0.06 % NA SOLN: 2.0000 | Freq: Four times a day (QID) | NASAL | Status: DC

## 2015-12-20 NOTE — ED Notes (Signed)
Patient    Requests   Rx     Sent  To   The    [facility]  [address]    rx   Verified   And  [place]  In

## 2015-12-20 NOTE — ED Notes (Signed)
Pt reports   Symptoms   Of   Cough   /   Congested          X   sev    Days       Pt  Has   A  History  Of  Cancer   And  Is  In  Remission

## 2015-12-20 NOTE — ED Provider Notes (Signed)
CSN: [id number]     Arrival date & time 12/20/15  1426 History   First MD Initiated Contact with Patient 12/20/15 1555     Chief Complaint  Patient presents with  . URI   (Consider location/radiation/quality/duration/timing/severity/associated sxs/prior Treatment) HPI  He is an 82 year old man here for evaluation of cold symptoms. He states his symptoms started on Monday. He describes nasal congestion, runny nose, cough, and sneezing. He denies any shortness of breath, wheezing, or chest pain. No fevers. No nausea or vomiting. He has not tried any medications due to concerns about interactions with his prescriptions.  He does have a history of lung cancer, but is currently disease-free. Last check with his oncologist was in February of this year.  Past Medical History  Diagnosis Date  . CAD S/P percutaneous coronary angioplasty January 2006    Abnormal Myoview with inferolateral ischemia: --> 80% prox LAD lesion PCI: Taxus DES 3.0 mm x 16 mm-;; Myoview June 2009 no ischemia or infarction with attenuation artifact  . Hypertension   . Dyslipidemia, goal LDL below 70     on simvastatin and tricor  . Osteoarthritis   . BPH (benign prostatic hyperplasia)   . GERD (gastroesophageal reflux disease)   . Squamous cell lung cancer : Left lower lobe  08/03/2014    Bronchoscopy showed extrinsic compression of the left lower lobe with endobronchial biopsies were negative. He subsequently had a needle biopsy which diagnosed the mass as squamous cell carcinoma   . Full code status 04/23/2015   Past Surgical History  Procedure Laterality Date  . Nm myoview ltd  07/2004    inferolatera wall ischemia  . Nm myocar perf wall motion  January 06, 2008     treadmill  myoview - tissue attenuation but no ischemia or infarction  . Doppler echocardiography  June 18 ,2009    mild concentric LVH, NORMAL ef, MILDLY  DILATED LEFT ATRIUM  AND MILD SCLEROTIC  AORTIC  VALVE AND OTHERWISE  NORMAL  . Cardiac catheterization   07/2004    80% proximal LAD lesion--PCI, EF 60%  . Percutaneous coronary stent intervention (pci-s)  2/27/ 2006    TAXUS DES 3.0 mm x 16 mm stent ->  PROX  LAD  . Renal doppler  05/03/2007    rgt and lft renal arteries normal patency; rgt and lft kidneys normal size  . Umbilical hernia repair    . Appendectomy    . Cataract extraction    . Video assisted thoracoscopy (vats)/ lobectomy Left 08/10/2014    Procedure: VIDEO ASSISTED THORACOSCOPY (VATS)/ LOBECTOMY;  Surgeon: Ivin Poot, MD;  Location: [place];  Service: Thoracic;  Laterality: Left;  . Video bronchoscopy N/A 08/10/2014    Procedure: VIDEO BRONCHOSCOPY;  Surgeon: Ivin Poot, MD;  Location: [facility] OR;  Service: Thoracic;  Laterality: N/A;   Family History  Problem Relation Age of Onset  . Cancer Mother   . Heart disease Mother   . Heart disease Father   . Parkinsonism Brother   . Thyroid disease Neg Hx    Social History  Substance Use Topics  . Smoking status: Former Smoker    Types: Cigarettes    Quit date: 10/25/1982  . Smokeless tobacco: None  . Alcohol Use: No    Review of Systems As in history of present illness Allergies  Niaspan  Home Medications   Prior to Admission medications   Medication Sig Start Date End Date Taking? Authorizing Provider  acetaminophen (TYLENOL) 325 MG tablet  Take 650 mg by mouth every 6 (six) hours as needed (pain).    Historical Provider, MD  aspirin EC 325 MG tablet Take 325 mg by mouth daily.    Historical Provider, MD  fenofibrate (TRICOR) 145 MG tablet TAKE 1 TABLET BY MOUTH EVERY DAY 08/20/15   Leonie Man, MD  ipratropium (ATROVENT) 0.06 % nasal spray Place 2 sprays into both nostrils 4 (four) times daily. 12/20/15   Melony Overly, MD  isosorbide mononitrate (IMDUR) 30 MG 24 hr tablet TAKE 1 TABLET BY MOUTH DAILY. 10/26/14   Leonie Man, MD  lactulose (CHRONULAC) 10 GM/15ML solution Take 30 mLs (20 g total) by mouth daily as needed for mild constipation. 08/23/14   Ivin Poot, MD  methimazole (TAPAZOLE) 10 MG tablet TAKE 1 TABLET BY MOUTH TWICE DAILY 12/19/15   Renato Shin, MD  methimazole (TAPAZOLE) 5 MG tablet Take 1 tablet (5 mg total) by mouth daily. 08/15/15   Renato Shin, MD  metoprolol succinate (TOPROL-XL) 25 MG 24 hr tablet TAKE 1 TABLET BY MOUTH DAILY. 10/26/14   Leonie Man, MD  prochlorperazine (COMPAZINE) 10 MG tablet Take 1 tablet (10 mg total) by mouth every 6 (six) hours as needed for nausea or vomiting. 08/24/14   Curt Bears, MD  simvastatin (ZOCOR) 40 MG tablet TAKE 1 TABLET BY MOUTH EVERY DAY 08/20/15   Leonie Man, MD  telmisartan-hydrochlorothiazide (MICARDIS HCT) 40-12.5 MG per tablet Take 0.5 tablets by mouth daily. 11/13/14   Leonie Man, MD  traMADol (ULTRAM) 50 MG tablet Take 50 mg by mouth every 6 (six) hours as needed for moderate pain.    Historical Provider, MD   Meds Ordered and Administered this Visit  Medications - No data to display  BP 136/68 mmHg  Pulse 74  Temp(Src) 97.7 F (36.5 C) (Oral)  Resp 18  SpO2 98% No data found.   Physical Exam  Constitutional: He is oriented to person, place, and time. He appears well-developed and well-nourished. No distress.  HENT:  Mouth/Throat: No oropharyngeal exudate.  Mild oropharyngeal erythema. Small amount of nasal discharge is present with mild erythema. TMs normal bilaterally.  Neck: Neck supple.  Cardiovascular: Normal rate, regular rhythm and normal heart sounds.   No murmur heard. Pulmonary/Chest: Effort normal and breath sounds normal. No respiratory distress. He has no wheezes. He has no rales.  Lymphadenopathy:    He has no cervical adenopathy.  Neurological: He is alert and oriented to person, place, and time.    ED Course  Procedures (including critical care time)  Labs Review Labs Reviewed - No data to display  Imaging Review No results found.   MDM   1. Viral URI with cough    No sign of pneumonia or bronchitis. Treat with  over-the-counter cetirizine. Discussed that this needs to be the plain cetirizine. Atrovent nasal spray to help with nasal symptoms. Return precautions reviewed.    Melony Overly, MD 12/20/15 [phone]

## 2015-12-20 NOTE — Discharge Instructions (Signed)
You have a cold virus. There is no sign of pneumonia or bronchitis. Get plain cetirizine at the drug store and take this once a day. Use the Atrovent nasal spray 4 times a day for the next 5 days. Your symptoms should get better over the next 2-3 days. If you develop fevers, trouble breathing, or are just not getting better, please follow-up here or with your primary care doctor.

## 2016-01-22 ENCOUNTER — Other Ambulatory Visit: Payer: Self-pay | Admitting: Cardiology

## 2016-01-23 NOTE — Telephone Encounter (Signed)
Rx Refill

## 2016-02-09 ENCOUNTER — Other Ambulatory Visit: Payer: Self-pay | Admitting: Cardiology

## 2016-02-18 ENCOUNTER — Telehealth: Payer: Self-pay | Admitting: Medical Oncology

## 2016-02-18 NOTE — Telephone Encounter (Signed)
Pt asking about CT scan -he does not have an appt. I gave him the appt time.

## 2016-02-25 ENCOUNTER — Ambulatory Visit (HOSPITAL_COMMUNITY)
Admission: RE | Admit: 2016-02-25 | Discharge: 2016-02-25 | Disposition: A | Discharge status: Home / Self Care | Payer: Medicare Other | Source: Ambulatory Visit | Attending: Internal Medicine | Admitting: Internal Medicine

## 2016-02-25 ENCOUNTER — Other Ambulatory Visit (HOSPITAL_BASED_OUTPATIENT_CLINIC_OR_DEPARTMENT_OTHER): Payer: Medicare Other

## 2016-02-25 DIAGNOSIS — Z902 Acquired absence of lung [part of]: Secondary | ICD-10-CM | POA: Diagnosis not present

## 2016-02-25 DIAGNOSIS — C3492 Malignant neoplasm of unspecified part of left bronchus or lung: Secondary | ICD-10-CM | POA: Diagnosis present

## 2016-02-25 DIAGNOSIS — I700 Atherosclerosis of aorta: Secondary | ICD-10-CM | POA: Insufficient documentation

## 2016-02-25 DIAGNOSIS — C3432 Malignant neoplasm of lower lobe, left bronchus or lung: Secondary | ICD-10-CM

## 2016-02-25 DIAGNOSIS — I2510 Atherosclerotic heart disease of native coronary artery without angina pectoris: Secondary | ICD-10-CM | POA: Insufficient documentation

## 2016-02-25 DIAGNOSIS — K8020 Calculus of gallbladder without cholecystitis without obstruction: Secondary | ICD-10-CM | POA: Insufficient documentation

## 2016-02-25 LAB — CBC WITH DIFFERENTIAL/PLATELET
BASO%: 0.5 % (ref 0.0–2.0)
BASOS ABS: 0 10*3/uL (ref 0.0–0.1)
EOS%: 6.1 % (ref 0.0–7.0)
Eosinophils Absolute: 0.4 10*3/uL (ref 0.0–0.5)
HCT: 42.9 % (ref 38.4–49.9)
HEMOGLOBIN: 14.4 g/dL (ref 13.0–17.1)
LYMPH%: 32.3 % (ref 14.0–49.0)
MCH: 31.3 pg (ref 27.2–33.4)
MCHC: 33.6 g/dL (ref 32.0–36.0)
MCV: 93.3 fL (ref 79.3–98.0)
MONO#: 0.5 10*3/uL (ref 0.1–0.9)
MONO%: 8.7 % (ref 0.0–14.0)
NEUT#: 3.2 10*3/uL (ref 1.5–6.5)
NEUT%: 52.4 % (ref 39.0–75.0)
Platelets: 173 10*3/uL (ref 140–400)
RBC: 4.6 10*6/uL (ref 4.20–5.82)
RDW: 14.6 % (ref 11.0–14.6)
WBC: 6.1 10*3/uL (ref 4.0–10.3)
lymph#: 2 10*3/uL (ref 0.9–3.3)

## 2016-02-25 LAB — COMPREHENSIVE METABOLIC PANEL
ALT: 11 U/L (ref 0–55)
AST: 23 U/L (ref 5–34)
Albumin: 3.9 g/dL (ref 3.5–5.0)
Alkaline Phosphatase: 70 U/L (ref 40–150)
Anion Gap: 10 mEq/L (ref 3–11)
BUN: 21.2 mg/dL (ref 7.0–26.0)
CHLORIDE: 105 meq/L (ref 98–109)
CO2: 27 mEq/L (ref 22–29)
Calcium: 9.3 mg/dL (ref 8.4–10.4)
Creatinine: 1.4 mg/dL — ABNORMAL HIGH (ref 0.7–1.3)
EGFR: 48 mL/min/{1.73_m2} — ABNORMAL LOW (ref >90)
GLUCOSE: 88 mg/dL (ref 70–140)
POTASSIUM: 4.1 meq/L (ref 3.5–5.1)
SODIUM: 142 meq/L (ref 136–145)
Total Bilirubin: 0.59 mg/dL (ref 0.20–1.20)
Total Protein: 6.9 g/dL (ref 6.4–8.3)

## 2016-02-25 MED ORDER — IOPAMIDOL (ISOVUE-300) INJECTION 61%
75.0000 mL | Freq: Once | INTRAVENOUS | Status: AC | PRN
  Administered 2016-02-25: 75 mL via INTRAVENOUS

## 2016-02-27 ENCOUNTER — Other Ambulatory Visit: Payer: Self-pay | Admitting: Cardiology

## 2016-02-27 ENCOUNTER — Other Ambulatory Visit: Payer: Self-pay | Admitting: Endocrinology

## 2016-02-28 NOTE — Telephone Encounter (Signed)
Rx request sent to pharmacy.  

## 2016-03-03 ENCOUNTER — Ambulatory Visit: Payer: Medicare Other | Admitting: Internal Medicine

## 2016-03-03 ENCOUNTER — Telehealth: Payer: Self-pay | Admitting: Internal Medicine

## 2016-03-03 NOTE — Telephone Encounter (Signed)
Spoke with pt to confirm 8/30 appt at 930 am

## 2016-03-19 ENCOUNTER — Ambulatory Visit (HOSPITAL_BASED_OUTPATIENT_CLINIC_OR_DEPARTMENT_OTHER): Payer: Medicare Other | Admitting: Internal Medicine

## 2016-03-19 ENCOUNTER — Telehealth: Payer: Self-pay | Admitting: Internal Medicine

## 2016-03-19 ENCOUNTER — Encounter: Payer: Self-pay | Admitting: Internal Medicine

## 2016-03-19 VITALS — BP 126/65 | HR 81 | Temp 97.5°F | Resp 18 | Ht 73.0 in | Wt 214.7 lb

## 2016-03-19 DIAGNOSIS — C3432 Malignant neoplasm of lower lobe, left bronchus or lung: Secondary | ICD-10-CM

## 2016-03-19 DIAGNOSIS — C3492 Malignant neoplasm of unspecified part of left bronchus or lung: Secondary | ICD-10-CM

## 2016-03-19 DIAGNOSIS — R5383 Other fatigue: Secondary | ICD-10-CM | POA: Diagnosis not present

## 2016-03-19 NOTE — Telephone Encounter (Signed)
GAVE PATIENT AVS REPORT AND APPOINTMENTS FOR February AND MARCH. [facility] WILL CALL RE SCANS -PATIENT

## 2016-03-19 NOTE — Progress Notes (Signed)
[facility] [phone]   [phone]  OFFICE PROGRESS NOTE  [name], MD [place] [address]  DIAGNOSIS: Stage IIA (T2b, N0, M0) non-small cell lung cancer consistent with squamous cell carcinoma diagnosed in January 2016  PRIOR THERAPY:  1) Status post left VATS with left lower lobectomy and mediastinal lymph node dissection under the care of Dr. Prescott Gum on 08/10/2014. 2) Adjuvant systemic chemotherapy with carboplatin for AUC of 5 on day 1 and gemcitabine 1000 MG/M2 on days 1 and 8 every 3 weeks. First dose 09/19/2014.  CURRENT THERAPY: Observation.  INTERVAL HISTORY: Jeremy Deleon 82 y.o. male returns to the clinic today for follow-up visit. The patient is feeling fine today with no specific complaints. He is currently on observation and feeling fine except for arthritis. He denied having any significant fever or chills, no nausea or vomiting. The patient denied having any significant weight loss or night sweats. He has no chest pain, shortness of breath, cough or hemoptysis. He had repeat CT scan of the chest performed recently and he is here for evaluation and discussion of his scan results.  MEDICAL HISTORY: Past Medical History:  Diagnosis Date  . BPH (benign prostatic hyperplasia)   . CAD S/P percutaneous coronary angioplasty January 2006   Abnormal Myoview with inferolateral ischemia: --> 80% prox LAD lesion PCI: Taxus DES 3.0 mm x 16 mm-;; Myoview June 2009 no ischemia or infarction with attenuation artifact  . Dyslipidemia, goal LDL below 70    on simvastatin and tricor  . Full code status 04/23/2015  . GERD (gastroesophageal reflux disease)   . Hypertension   . Osteoarthritis   . Squamous cell lung cancer : Left lower lobe  08/03/2014   Bronchoscopy showed extrinsic compression of the left lower lobe with endobronchial biopsies were negative. He subsequently had a needle biopsy which  diagnosed the mass as squamous cell carcinoma     ALLERGIES:  is allergic to niaspan [niacin er].  MEDICATIONS:  Current Outpatient Prescriptions  Medication Sig Dispense Refill  . aspirin EC 325 MG tablet Take 325 mg by mouth daily.    . fenofibrate (TRICOR) 145 MG tablet TAKE 1 TABLET BY MOUTH EVERY DAY 30 tablet 0  . ipratropium (ATROVENT) 0.06 % nasal spray Place 2 sprays into both nostrils 4 (four) times daily. 15 mL 0  . isosorbide mononitrate (IMDUR) 30 MG 24 hr tablet TAKE 1 TABLET BY MOUTH EVERY DAY 90 tablet 0  . metoprolol succinate (TOPROL-XL) 25 MG 24 hr tablet TAKE 1 TABLET BY MOUTH DAILY. 90 tablet 1  . prochlorperazine (COMPAZINE) 10 MG tablet Take 1 tablet (10 mg total) by mouth every 6 (six) hours as needed for nausea or vomiting. 30 tablet 0  . simvastatin (ZOCOR) 40 MG tablet TAKE 1 TABLET BY MOUTH EVERY DAY 30 tablet 0  . telmisartan-hydrochlorothiazide (MICARDIS HCT) 40-12.5 MG tablet TAKE 1 TABLET BY MOUTH EVERY DAY 90 tablet 3  . traMADol (ULTRAM) 50 MG tablet Take 50 mg by mouth every 6 (six) hours as needed.    Marland Kitchen acetaminophen (TYLENOL) 325 MG tablet Take 650 mg by mouth every 6 (six) hours as needed (pain).    Marland Kitchen lactulose (CHRONULAC) 10 GM/15ML solution Take 30 mLs (20 g total) by mouth daily as needed for mild constipation. (Patient not taking: Reported on 03/19/2016) 240 mL 1  . methimazole (TAPAZOLE) 10 MG tablet Take 10 mg by mouth daily.  0  No current facility-administered medications for this visit.     SURGICAL HISTORY:  Past Surgical History:  Procedure Laterality Date  . APPENDECTOMY    . CARDIAC CATHETERIZATION  07/2004   80% proximal LAD lesion--PCI, EF 60%  . CATARACT EXTRACTION    . DOPPLER ECHOCARDIOGRAPHY  June 18 ,2009   mild concentric LVH, NORMAL ef, MILDLY  DILATED LEFT ATRIUM  AND MILD SCLEROTIC  AORTIC  VALVE AND OTHERWISE  NORMAL  . NM MYOCAR PERF WALL MOTION  January 06, 2008    treadmill  myoview - tissue attenuation but no ischemia or  infarction  . NM MYOVIEW LTD  07/2004   inferolatera wall ischemia  . PERCUTANEOUS CORONARY STENT INTERVENTION (PCI-S)  2/27/ 2006   TAXUS DES 3.0 mm x 16 mm stent ->  PROX  LAD  . renal doppler  05/03/2007   rgt and lft renal arteries normal patency; rgt and lft kidneys normal size  . UMBILICAL HERNIA REPAIR    . VIDEO ASSISTED THORACOSCOPY (VATS)/ LOBECTOMY Left 08/10/2014   Procedure: VIDEO ASSISTED THORACOSCOPY (VATS)/ LOBECTOMY;  Surgeon: Ivin Poot, MD;  Location: [place];  Service: Thoracic;  Laterality: Left;  Marland Kitchen VIDEO BRONCHOSCOPY N/A 08/10/2014   Procedure: VIDEO BRONCHOSCOPY;  Surgeon: Ivin Poot, MD;  Location: [facility] OR;  Service: Thoracic;  Laterality: N/A;    REVIEW OF SYSTEMS:  A comprehensive review of systems was negative except for: Musculoskeletal: positive for arthralgias   PHYSICAL EXAMINATION: General appearance: alert, cooperative and no distress Head: Normocephalic, without obvious abnormality, atraumatic Neck: no adenopathy, no JVD, supple, symmetrical, trachea midline and thyroid not enlarged, symmetric, no tenderness/mass/nodules Lymph nodes: Cervical, supraclavicular, and axillary nodes normal. Resp: clear to auscultation bilaterally Back: symmetric, no curvature. ROM normal. No CVA tenderness. Cardio: normal apical impulse GI: soft, non-tender; bowel sounds normal; no masses,  no organomegaly Extremities: extremities normal, atraumatic, no cyanosis or edema Neurologic: Alert and oriented X 3, normal strength and tone. Normal symmetric reflexes. Normal coordination and gait  ECOG PERFORMANCE STATUS: 1 - Symptomatic but completely ambulatory  Blood pressure 126/65, pulse 81, temperature 97.5 F (36.4 C), temperature source Oral, resp. rate 18, height '6\' 1"'$  (1.854 m), weight 214 lb 11.2 oz (97.4 kg), SpO2 98 %.  LABORATORY DATA: Lab Results  Component Value Date   WBC 6.1 02/25/2016   HGB 14.4 02/25/2016   HCT 42.9 02/25/2016   MCV 93.3 02/25/2016    PLT 173 02/25/2016      Chemistry      Component Value Date/Time   NA 142 02/25/2016 0923   K 4.1 02/25/2016 0923   CL 105 08/14/2014 0255   CO2 27 02/25/2016 0923   BUN 21.2 02/25/2016 0923   CREATININE 1.4 (H) 02/25/2016 0923      Component Value Date/Time   CALCIUM 9.3 02/25/2016 0923   ALKPHOS 70 02/25/2016 0923   AST 23 02/25/2016 0923   ALT 11 02/25/2016 0923   BILITOT 0.59 02/25/2016 0923       RADIOGRAPHIC STUDIES: Ct Chest W Contrast  Result Date: 02/25/2016 CLINICAL DATA:  Squamous cell left lung cancer, chemotherapy complete. EXAM: CT CHEST WITH CONTRAST TECHNIQUE: Multidetector CT imaging of the chest was performed during intravenous contrast administration. CONTRAST:  79m ISOVUE-300 IOPAMIDOL (ISOVUE-300) INJECTION 61% COMPARISON:  08/21/2015. FINDINGS: Cardiovascular: Atherosclerotic calcification of the arterial vasculature, including three-vessel involvement of the coronary arteries. Heart size normal. No pericardial effusion. Mediastinum/Nodes: No pathologically enlarged mediastinal, hilar or axillary lymph nodes. Tiny hiatal hernia. Esophagus  is unremarkable. Tiny hiatal hernia. Lungs/Pleura: Calcified granulomas. A few scattered tiny pulmonary nodules measure 4 mm or less in size, as before. Scattered pulmonary parenchymal scarring. Left lower lobectomy. Trace left pleural fluid, stable. Upper Abdomen: Visualized portions of the liver is unremarkable. Stones layer in the gallbladder. Visualized portions of the adrenal glands, kidneys, spleen, pancreas, stomach and bowel are grossly unremarkable with exception of a tiny hiatal hernia, mentioned earlier. No upper abdominal adenopathy. Musculoskeletal: No worrisome lytic or sclerotic lesions. Thoracotomy changes on the left. IMPRESSION: 1. Left lower lobectomy without evidence of recurrent or metastatic disease. 2. Aortic atherosclerosis and three-vessel coronary artery calcification. 3. Cholelithiasis. Electronically Signed    By: Lorin Picket M.D.   On: 02/25/2016 11:51    ASSESSMENT AND PLAN: This is a very pleasant 82 years old white male recently diagnosed with a stage IIA non-small cell lung cancer, squamous cell carcinoma status post left lower lobectomy with lymph node dissection. This was followed by 4 cycles of adjuvant chemotherapy with carboplatin and gemcitabine. The patient tolerated his treatment fairly well with no significant complaints except for mild fatigue. The patient is currently on observation and is feeling fine. His recent CT scan of the chest showed no evidence for disease recurrence. I discussed the scan results with the patient today. I recommended for him to continue on observation with repeat CT scan of the chest in 6 months. He was advised to call immediately if he has any concerning symptoms in the interval. The patient voices understanding of current disease status and treatment options and is in agreement with the current care plan.  All questions were answered. The patient knows to call the clinic with any problems, questions or concerns. We can certainly see the patient much sooner if necessary.  Disclaimer: This note was dictated with voice recognition software. Similar sounding words can inadvertently be transcribed and may not be corrected upon review.

## 2016-03-28 ENCOUNTER — Encounter: Payer: Self-pay | Admitting: Endocrinology

## 2016-03-28 ENCOUNTER — Ambulatory Visit (INDEPENDENT_AMBULATORY_CARE_PROVIDER_SITE_OTHER): Payer: Medicare Other | Admitting: Endocrinology

## 2016-03-28 VITALS — BP 118/80 | HR 84 | Ht 73.0 in | Wt 215.0 lb

## 2016-03-28 DIAGNOSIS — E0590 Thyrotoxicosis, unspecified without thyrotoxic crisis or storm: Secondary | ICD-10-CM

## 2016-03-28 LAB — T4, FREE: FREE T4: 0.65 ng/dL (ref 0.60–1.60)

## 2016-03-28 LAB — TSH: TSH: 2.6 u[IU]/mL (ref 0.35–4.50)

## 2016-03-28 NOTE — Patient Instructions (Addendum)
Thyroid blood tests are requested for you today.  We'll let you know about the results. if ever you have fever while taking methimazole, stop it and call us, even if the reason is obvious, because of the risk of a rare side-effect. Please come back for a follow-up appointment in 6 months.

## 2016-03-28 NOTE — Progress Notes (Signed)
Subjective:    Patient ID: Jeremy Deleon, male    DOB: 12/02/1932, 82 y.o.   MRN: [id number]  HPI Pt returns for f/u of hyperthyroidism (dx'ed in Nov of 2016; he has never had dedicated thyroid imaging, but Grave's dz is presumed; tapazole is chosen as rx, due to the need for frequent CT scans with contrast). pt states he feels well in general. He takes tapazole as rx'ed.  Past Medical History:  Diagnosis Date  . BPH (benign prostatic hyperplasia)   . CAD S/P percutaneous coronary angioplasty January 2006   Abnormal Myoview with inferolateral ischemia: --> 80% prox LAD lesion PCI: Taxus DES 3.0 mm x 16 mm-;; Myoview June 2009 no ischemia or infarction with attenuation artifact  . Dyslipidemia, goal LDL below 70    on simvastatin and tricor  . Full code status 04/23/2015  . GERD (gastroesophageal reflux disease)   . Hypertension   . Osteoarthritis   . Squamous cell lung cancer : Left lower lobe  08/03/2014   Bronchoscopy showed extrinsic compression of the left lower lobe with endobronchial biopsies were negative. He subsequently had a needle biopsy which diagnosed the mass as squamous cell carcinoma     Past Surgical History:  Procedure Laterality Date  . APPENDECTOMY    . CARDIAC CATHETERIZATION  07/2004   80% proximal LAD lesion--PCI, EF 60%  . CATARACT EXTRACTION    . DOPPLER ECHOCARDIOGRAPHY  June 18 ,2009   mild concentric LVH, NORMAL ef, MILDLY  DILATED LEFT ATRIUM  AND MILD SCLEROTIC  AORTIC  VALVE AND OTHERWISE  NORMAL  . NM MYOCAR PERF WALL MOTION  January 06, 2008    treadmill  myoview - tissue attenuation but no ischemia or infarction  . NM MYOVIEW LTD  07/2004   inferolatera wall ischemia  . PERCUTANEOUS CORONARY STENT INTERVENTION (PCI-S)  2/27/ 2006   TAXUS DES 3.0 mm x 16 mm stent ->  PROX  LAD  . renal doppler  05/03/2007   rgt and lft renal arteries normal patency; rgt and lft kidneys normal size  . UMBILICAL HERNIA REPAIR    . VIDEO ASSISTED THORACOSCOPY (VATS)/  LOBECTOMY Left 08/10/2014   Procedure: VIDEO ASSISTED THORACOSCOPY (VATS)/ LOBECTOMY;  Surgeon: Ivin Poot, MD;  Location: [facility];  Service: Thoracic;  Laterality: Left;  Marland Kitchen VIDEO BRONCHOSCOPY N/A 08/10/2014   Procedure: VIDEO BRONCHOSCOPY;  Surgeon: Ivin Poot, MD;  Location: [facility] OR;  Service: Thoracic;  Laterality: N/A;    Social History   Social History  . Marital status: Single    Spouse name: N/A  . Number of children: N/A  . Years of education: N/A   Occupational History  . Not on file.   Social History Main Topics  . Smoking status: Former Smoker    Types: Cigarettes    Quit date: 10/25/1982  . Smokeless tobacco: Not on file  . Alcohol use No  . Drug use: No  . Sexual activity: Not on file   Other Topics Concern  . Not on file   Social History Narrative    He is a widowed father of 16, grandfather of 52, still working. Does not smoke, quit 30   years ago and does not drink alcohol. He still works in his Nurse, learning disability for [employer].    Current Outpatient Prescriptions on File Prior to Visit  Medication Sig Dispense Refill  . acetaminophen (TYLENOL) 325 MG tablet Take 650 mg by mouth every 6 (six) hours as needed (  pain).    . aspirin EC 325 MG tablet Take 325 mg by mouth daily.    . fenofibrate (TRICOR) 145 MG tablet TAKE 1 TABLET BY MOUTH EVERY DAY 30 tablet 0  . ipratropium (ATROVENT) 0.06 % nasal spray Place 2 sprays into both nostrils 4 (four) times daily. 15 mL 0  . isosorbide mononitrate (IMDUR) 30 MG 24 hr tablet TAKE 1 TABLET BY MOUTH EVERY DAY 90 tablet 0  . methimazole (TAPAZOLE) 10 MG tablet Take 10 mg by mouth daily.  0  . metoprolol succinate (TOPROL-XL) 25 MG 24 hr tablet TAKE 1 TABLET BY MOUTH DAILY. 90 tablet 1  . prochlorperazine (COMPAZINE) 10 MG tablet Take 1 tablet (10 mg total) by mouth every 6 (six) hours as needed for nausea or vomiting. 30 tablet 0  . simvastatin (ZOCOR) 40 MG tablet TAKE 1 TABLET BY MOUTH EVERY DAY 30  tablet 0  . telmisartan-hydrochlorothiazide (MICARDIS HCT) 40-12.5 MG tablet TAKE 1 TABLET BY MOUTH EVERY DAY 90 tablet 3  . traMADol (ULTRAM) 50 MG tablet Take 50 mg by mouth every 6 (six) hours as needed.    . lactulose (CHRONULAC) 10 GM/15ML solution Take 30 mLs (20 g total) by mouth daily as needed for mild constipation. (Patient not taking: Reported on 03/19/2016) 240 mL 1   No current facility-administered medications on file prior to visit.     Allergies  Allergen Reactions  . Niaspan [Niacin Er] Other (See Comments)    flushing    Family History  Problem Relation Age of Onset  . Cancer Mother   . Heart disease Mother   . Heart disease Father   . Parkinsonism Brother   . Thyroid disease Neg Hx     BP 118/80   Pulse 84   Ht '6\' 1"'$  (1.854 m)   Wt 215 lb (97.5 kg)   SpO2 96%   BMI 28.37 kg/m   Review of Systems Denies fever    Objective:   Physical Exam VITAL SIGNS:  See vs page GENERAL: no distress NECK: There is no palpable thyroid enlargement.  No thyroid nodule is palpable.  No palpable lymphadenopathy at the anterior neck.    Lab Results  Component Value Date   TSH 2.60 03/28/2016      Assessment & Plan:  Hyperthyroidism: well-controlled.  Please continue the same medication.

## 2016-04-24 ENCOUNTER — Encounter: Payer: Self-pay | Admitting: Nurse Practitioner

## 2016-04-25 ENCOUNTER — Other Ambulatory Visit: Payer: Medicare Other

## 2016-05-01 ENCOUNTER — Ambulatory Visit: Payer: Medicare Other | Admitting: Internal Medicine

## 2016-05-02 ENCOUNTER — Ambulatory Visit (INDEPENDENT_AMBULATORY_CARE_PROVIDER_SITE_OTHER): Payer: Medicare Other | Admitting: Nurse Practitioner

## 2016-05-02 ENCOUNTER — Encounter (INDEPENDENT_AMBULATORY_CARE_PROVIDER_SITE_OTHER): Payer: Self-pay

## 2016-05-02 ENCOUNTER — Other Ambulatory Visit: Payer: Self-pay | Admitting: Cardiology

## 2016-05-02 ENCOUNTER — Encounter: Payer: Self-pay | Admitting: Nurse Practitioner

## 2016-05-02 ENCOUNTER — Other Ambulatory Visit: Payer: Self-pay | Admitting: Nurse Practitioner

## 2016-05-02 DIAGNOSIS — K5900 Constipation, unspecified: Secondary | ICD-10-CM | POA: Diagnosis not present

## 2016-05-02 MED ORDER — LACTULOSE 10 GM/15ML PO SOLN: ORAL | 0 refills | Status: DC

## 2016-05-02 NOTE — Progress Notes (Addendum)
HPI: Patient is an 82 year old male, new to this practice  with hx of CAD s/p DES in prox LAD in 2006, hyperthyroidism, hypertension, and stage II A (T2b, N0, M0) non-small cell lung cancer diagnosed January 2016.  He is status post left lower lobectomy with lymph node dissection followed by adjuvant chemotherapy No evidence for disease recurrence on recent CT scans.   Patient referred by PCP, Harriet Masson, P.A , for constipation. Patient states his bowel movements were a lot more normal prior to his lobectomy, now they are less frequent. No blood in stool. . Since then he's been on lactulose which worked pretty well but only took it as needed. Patient was then tried on MiraLAX for a few weeks and despite taking it on a daily basis the result was nearly nothing . Patient is doubtful he gets enough fiber in his diet, he drinks iced tea but as far as water, gets only one bottle a day. Last colonoscopy was greater than 15 years ago.    Past Medical History:  Diagnosis Date  . BPH (benign prostatic hyperplasia)   . CAD S/P percutaneous coronary angioplasty January 2006   Abnormal Myoview with inferolateral ischemia: --> 80% prox LAD lesion PCI: Taxus DES 3.0 mm x 16 mm-;; Myoview June 2009 no ischemia or infarction with attenuation artifact  . Dyslipidemia, goal LDL below 70    on simvastatin and tricor  . Full code status 04/23/2015  . GERD (gastroesophageal reflux disease)   . Hypertension   . Osteoarthritis   . Squamous cell lung cancer : Left lower lobe  08/03/2014   Bronchoscopy showed extrinsic compression of the left lower lobe with endobronchial biopsies were negative. He subsequently had a needle biopsy which diagnosed the mass as squamous cell carcinoma     Past Surgical History:  Procedure Laterality Date  . APPENDECTOMY    . CARDIAC CATHETERIZATION  07/2004   80% proximal LAD lesion--PCI, EF 60%  . CATARACT EXTRACTION    . DOPPLER ECHOCARDIOGRAPHY  June 18 ,2009   mild  concentric LVH, NORMAL ef, MILDLY  DILATED LEFT ATRIUM  AND MILD SCLEROTIC  AORTIC  VALVE AND OTHERWISE  NORMAL  . NM MYOCAR PERF WALL MOTION  January 06, 2008    treadmill  myoview - tissue attenuation but no ischemia or infarction  . NM MYOVIEW LTD  07/2004   inferolatera wall ischemia  . PERCUTANEOUS CORONARY STENT INTERVENTION (PCI-S)  2/27/ 2006   TAXUS DES 3.0 mm x 16 mm stent ->  PROX  LAD  . renal doppler  05/03/2007   rgt and lft renal arteries normal patency; rgt and lft kidneys normal size  . UMBILICAL HERNIA REPAIR    . VIDEO ASSISTED THORACOSCOPY (VATS)/ LOBECTOMY Left 08/10/2014   Procedure: VIDEO ASSISTED THORACOSCOPY (VATS)/ LOBECTOMY;  Surgeon: Ivin Poot, MD;  Location: [facility];  Service: Thoracic;  Laterality: Left;  Marland Kitchen VIDEO BRONCHOSCOPY N/A 08/10/2014   Procedure: VIDEO BRONCHOSCOPY;  Surgeon: Ivin Poot, MD;  Location: [facility] OR;  Service: Thoracic;  Laterality: N/A;   Family History  Problem Relation Age of Onset  . Cancer Mother   . Heart disease Mother   . Heart disease Father   . Parkinsonism Brother   . Thyroid disease Neg Hx    Social History  Substance Use Topics  . Smoking status: Former Smoker    Types: Cigarettes    Quit date: 10/25/1982  . Smokeless tobacco: Never Used  . Alcohol  use No   Current Outpatient Prescriptions  Medication Sig Dispense Refill  . acetaminophen (TYLENOL) 325 MG tablet Take 650 mg by mouth every 6 (six) hours as needed (pain).    Marland Kitchen aspirin EC 325 MG tablet Take 325 mg by mouth daily.    . fenofibrate (TRICOR) 145 MG tablet TAKE 1 TABLET BY MOUTH EVERY DAY 30 tablet 0  . ipratropium (ATROVENT) 0.06 % nasal spray Place 2 sprays into both nostrils 4 (four) times daily. 15 mL 0  . isosorbide mononitrate (IMDUR) 30 MG 24 hr tablet TAKE 1 TABLET BY MOUTH EVERY DAY 90 tablet 0  . lactulose (CHRONULAC) 10 GM/15ML solution Take 30 mLs (20 g total) by mouth daily as needed for mild constipation. 240 mL 1  . methimazole (TAPAZOLE) 10 MG  tablet Take 10 mg by mouth daily.  0  . metoprolol succinate (TOPROL-XL) 25 MG 24 hr tablet TAKE 1 TABLET BY MOUTH DAILY. 90 tablet 1  . prochlorperazine (COMPAZINE) 10 MG tablet Take 1 tablet (10 mg total) by mouth every 6 (six) hours as needed for nausea or vomiting. 30 tablet 0  . simvastatin (ZOCOR) 40 MG tablet TAKE 1 TABLET BY MOUTH EVERY DAY 30 tablet 0  . telmisartan-hydrochlorothiazide (MICARDIS HCT) 40-12.5 MG tablet TAKE 1 TABLET BY MOUTH EVERY DAY 90 tablet 3  . traMADol (ULTRAM) 50 MG tablet Take 50 mg by mouth every 6 (six) hours as needed.     No current facility-administered medications for this visit.    Allergies  Allergen Reactions  . Niaspan [Niacin Er] Other (See Comments)    flushing    Review of Systems: Positive for arthritis, hearing problems, and excessive urination. All other systems reviewed and negative except where noted in HPI.   Physical Exam: BP 132/78   Pulse 74   Ht '6\' 1"'$  (1.854 m)   Wt 218 lb 2 oz (98.9 kg)   BMI 28.78 kg/m  Constitutional: Pleasant,well-developed, white male in no acute distress. HEENT: Normocephalic and atraumatic. Conjunctivae are normal. No scleral icterus. Neck supple.  Cardiovascular: Normal rate, regular rhythm.  Pulmonary/chest: Effort normal and breath sounds normal. No wheezing, rales or rhonchi. Abdominal: Soft, nondistended, nontender. Bowel sounds active throughout. There are no masses palpable. No hepatomegaly. Extremities: no edema Lymphadenopathy: No cervical adenopathy noted. Neurological: Alert and oriented to person place and time. Skin: Skin is warm and dry. No rashes noted. Psychiatric: Normal mood and affect. Behavior is normal.   ASSESSMENT AND PLAN:  Chronic constipation, present since partial lung lobectomy Dec 2016. His recent labs are unremarkable, weight is stable. Very remote colonoscopy (can't recall results). Given age and absence of any alarm symptoms would not suggest colonoscopy at this  point.   -High fiber diet brochure given -Need to increase water intake from the one bottle a day to at least 8 glasses daily. -Lactulose 1 tablespoon every day -Call our office in 3-4 weeks if constipation has not improved     Cc:  Harriet Masson, P.A    Addendum: Reviewed and agree with initial management. Jerene Bears, MD

## 2016-05-02 NOTE — Patient Instructions (Signed)
We sent a refill for Lactulose liquid to your pharmacy.  [place].   Increase the fiber . Increase water to 8 glasses today.  Call if not better in 3-4 weeks.

## 2016-05-13 ENCOUNTER — Other Ambulatory Visit: Payer: Self-pay | Admitting: Cardiology

## 2016-05-22 ENCOUNTER — Other Ambulatory Visit: Payer: Self-pay | Admitting: Cardiology

## 2016-05-27 ENCOUNTER — Telehealth: Payer: Self-pay | Admitting: Nurse Practitioner

## 2016-05-27 NOTE — Telephone Encounter (Signed)
Spoke with Jeremy Deleon. He has Chronulac at home. He doesn't want the new Rx called in. He asks to call us if he does need more.

## 2016-07-04 ENCOUNTER — Other Ambulatory Visit: Payer: Self-pay | Admitting: Internal Medicine

## 2016-07-04 DIAGNOSIS — K5900 Constipation, unspecified: Secondary | ICD-10-CM

## 2016-07-18 ENCOUNTER — Other Ambulatory Visit: Payer: Self-pay | Admitting: Nurse Practitioner

## 2016-08-11 ENCOUNTER — Other Ambulatory Visit: Payer: Self-pay | Admitting: Dermatology

## 2016-08-13 ENCOUNTER — Other Ambulatory Visit: Payer: Self-pay | Admitting: Cardiology

## 2016-08-28 ENCOUNTER — Other Ambulatory Visit: Payer: Self-pay | Admitting: Cardiology

## 2016-08-28 ENCOUNTER — Other Ambulatory Visit: Payer: Self-pay | Admitting: Endocrinology

## 2016-08-28 NOTE — Telephone Encounter (Signed)
Rx(s) sent to pharmacy electronically.  

## 2016-09-01 ENCOUNTER — Ambulatory Visit (INDEPENDENT_AMBULATORY_CARE_PROVIDER_SITE_OTHER): Payer: Medicare Other | Admitting: Cardiology

## 2016-09-01 ENCOUNTER — Encounter: Payer: Self-pay | Admitting: Cardiology

## 2016-09-01 VITALS — BP 101/58 | HR 69 | Ht 73.0 in | Wt 218.6 lb

## 2016-09-01 DIAGNOSIS — I10 Essential (primary) hypertension: Secondary | ICD-10-CM

## 2016-09-01 DIAGNOSIS — I708 Atherosclerosis of other arteries: Secondary | ICD-10-CM

## 2016-09-01 DIAGNOSIS — E785 Hyperlipidemia, unspecified: Secondary | ICD-10-CM | POA: Diagnosis not present

## 2016-09-01 DIAGNOSIS — I951 Orthostatic hypotension: Secondary | ICD-10-CM

## 2016-09-01 DIAGNOSIS — Z9861 Coronary angioplasty status: Secondary | ICD-10-CM | POA: Diagnosis not present

## 2016-09-01 DIAGNOSIS — I2510 Atherosclerotic heart disease of native coronary artery without angina pectoris: Secondary | ICD-10-CM | POA: Diagnosis not present

## 2016-09-01 DIAGNOSIS — H3509 Other intraretinal microvascular abnormalities: Secondary | ICD-10-CM | POA: Insufficient documentation

## 2016-09-01 MED ORDER — LOSARTAN POTASSIUM 25 MG PO TABS: 25.0000 mg | ORAL_TABLET | Freq: Every day | ORAL | 3 refills | Status: DC

## 2016-09-01 NOTE — Patient Instructions (Signed)
Your physician has requested that you have an echocardiogram with bubble study @ [address]rd [address]. Echocardiography is a painless test that uses sound waves to create images of your heart. It provides your doctor with information about the size and shape of your heart and how well your heart's chambers and valves are working. This procedure takes approximately one hour. There are no restrictions for this procedure.  Your physician has requested that you have a carotid duplex. This test is an ultrasound of the carotid arteries in your neck. It looks at blood flow through these arteries that supply the brain with blood. Allow one hour for this exam. There are no restrictions or special instructions.  Your physician recommends that you return for lab work FASTING (lipid, liver function)  Your physician has recommended you make the following change in your medication:  -- STOP micardis-hct -- START losartan '25mg'$  once daily (1-2 days after stopping micardis-hct)   Your physician recommends that you schedule a follow-up appointment in: TWO MONTHS with Dr. Ellyn Hack

## 2016-09-01 NOTE — Progress Notes (Signed)
PCP: Jeremy Salinas, MD  Clinic Note: Chief Complaint  Patient presents with  . 1 year visit    occasioanl shortness of breath; retinal artery plaque  . Coronary Artery Disease    HPI: Jeremy Deleon is a 83 y.o. male with a PMH below who presents today for Essentially annual follow-up for CAD-PCI.Jeremy Deleon was last seen in April 2017 postop left lower lobectomy for lung cancer.  Recent Hospitalizations: June 2017 respiratory tract infection symptoms  Studies Reviewed: n/a  Interval History: Jeremy Deleon presents today feeling okay. He says he just feels lazy overall with no energy level. He also notes that he needs to sit for a while when he first sits up and morning. He has positional dizziness at that time. No syncope or near-syncope spells. No TIA or amaurosis fugax. Somewhat limited by his arthritis pains as far as activities go, but no resting or exertional dyspnea or chest discomfort. No PND, orthopnea with mild bilateral edema.  No palpitations.   No claudication.  He recently saw his eye doctor and was told that there was something in the right eye that he was in a semi-R about. My basic knowledge of this. It would mean that this probably is a retinal artery plaque or Hollenhorst plaque. He is not having any visual symptoms now.  He notes that every now and then without any particular initiating factor, he will have some dyspnea. This sometimes occurs when lying down, sometimes with walking. Not associated with any particular other symptoms.  ROS: A comprehensive was performed. Review of Systems  Constitutional: Positive for malaise/fatigue.  HENT: Positive for hearing loss. Negative for congestion and nosebleeds.   Eyes:       Is due to see his eye doctor soon.  Respiratory: Positive for shortness of breath. Negative for cough and wheezing.   Cardiovascular:       Per history of present illness  Gastrointestinal: Negative for abdominal pain, constipation and  heartburn.  Musculoskeletal: Positive for joint pain. Negative for falls and myalgias.       Small silver dollar size red firm spot on right shin.  Neurological: Positive for dizziness and weakness (Generalized).  Endo/Heme/Allergies: Bruises/bleeds easily.  Psychiatric/Behavioral: Positive for memory loss. Negative for depression. The patient is not nervous/anxious and does not have insomnia.     Past Medical History:  Diagnosis Date  . BPH (benign prostatic hyperplasia)   . CAD S/P percutaneous coronary angioplasty January 2006   Abnormal Myoview with inferolateral ischemia: --> 80% prox LAD lesion PCI: Taxus DES 3.0 mm x 16 mm-;; Myoview June 2009 no ischemia or infarction with attenuation artifact  . Dyslipidemia, goal LDL below 70    on simvastatin and tricor  . Full code status 04/23/2015  . GERD (gastroesophageal reflux disease)   . Hypertension   . Osteoarthritis   . Squamous cell lung cancer : Left lower lobe  08/03/2014   Bronchoscopy showed extrinsic compression of the left lower lobe with endobronchial biopsies were negative. He subsequently had a needle biopsy which diagnosed the mass as squamous cell carcinoma     Past Surgical History:  Procedure Laterality Date  . APPENDECTOMY    . CARDIAC CATHETERIZATION  07/2004   80% proximal LAD lesion--PCI, EF 60%  . CATARACT EXTRACTION    . DOPPLER ECHOCARDIOGRAPHY  June 18 ,2009   mild concentric LVH, NORMAL ef, MILDLY  DILATED LEFT ATRIUM  AND MILD SCLEROTIC  AORTIC  VALVE AND OTHERWISE  NORMAL  .  NM MYOCAR PERF WALL MOTION  January 06, 2008    treadmill  myoview - tissue attenuation but no ischemia or infarction  . NM MYOVIEW LTD  07/2004   inferolatera wall ischemia  . PERCUTANEOUS CORONARY STENT INTERVENTION (PCI-S)  2/27/ 2006   TAXUS DES 3.0 mm x 16 mm stent ->  PROX  LAD  . renal doppler  05/03/2007   rgt and lft renal arteries normal patency; rgt and lft kidneys normal size  . UMBILICAL HERNIA REPAIR    . VIDEO ASSISTED  THORACOSCOPY (VATS)/ LOBECTOMY Left 08/10/2014   Procedure: VIDEO ASSISTED THORACOSCOPY (VATS)/ LOBECTOMY;  Surgeon: Jeremy Poot, MD;  Location: [facility];  Service: Thoracic;  Laterality: Left;  Marland Kitchen VIDEO BRONCHOSCOPY N/A 08/10/2014   Procedure: VIDEO BRONCHOSCOPY;  Surgeon: Jeremy Poot, MD;  Location: [facility] OR;  Service: Thoracic;  Laterality: N/A;    Current Meds  Medication Sig  . acetaminophen (TYLENOL) 325 MG tablet Take 650 mg by mouth every 6 (six) hours as needed (pain).  Marland Kitchen aspirin EC 325 MG tablet Take 325 mg by mouth daily.  . fenofibrate (TRICOR) 145 MG tablet TAKE 1 TABLET BY MOUTH EVERY DAY  . fenofibrate (TRICOR) 145 MG tablet TAKE 1 TABLET(145 MG) BY MOUTH DAILY  . GENERLAC 10 GM/15ML SOLN TAKE 30ML BY MOUTH EVERY DAY  . isosorbide mononitrate (IMDUR) 30 MG 24 hr tablet TAKE 1 TABLET BY MOUTH EVERY DAY  . methimazole (TAPAZOLE) 10 MG tablet Take 10 mg by mouth daily.  . metoprolol succinate (TOPROL-XL) 25 MG 24 hr tablet TAKE 1 TABLET BY MOUTH DAILY.  . simvastatin (ZOCOR) 40 MG tablet TAKE 1 TABLET BY MOUTH EVERY DAY  . traMADol (ULTRAM) 50 MG tablet Take 50 mg by mouth every 6 (six) hours as needed.  . [DISCONTINUED] telmisartan-hydrochlorothiazide (MICARDIS HCT) 40-12.5 MG tablet TAKE 1 TABLET BY MOUTH EVERY DAY    Allergies  Allergen Reactions  . Niaspan [Niacin Er] Other (See Comments)    flushing    Social History   Social History  . Marital status: Single    Spouse name: N/A  . Number of children: N/A  . Years of education: N/A   Social History Main Topics  . Smoking status: Former Smoker    Types: Cigarettes    Quit date: 10/25/1982  . Smokeless tobacco: Never Used  . Alcohol use No  . Drug use: No  . Sexual activity: Not Asked   Other Topics Concern  . None   Social History Narrative    He is a widowed father of 8, grandfather of 11, still working. Does not smoke, quit 30   years ago and does not drink alcohol. He still works in his Education officer, museum for [employer].    family history includes Cancer in his mother; Heart disease in his father and mother; Parkinsonism in his brother.  Wt Readings from Last 3 Encounters:  09/01/16 99.2 kg (218 lb 9.6 oz)  05/02/16 98.9 kg (218 lb 2 oz)  03/28/16 97.5 kg (215 lb)    PHYSICAL EXAM BP (!) 101/58   Pulse 69   Ht '6\' 1"'$  (1.854 m)   Wt 99.2 kg (218 lb 9.6 oz)   BMI 28.84 kg/m  General appearance: alert, cooperative, appears stated age, well-nourished & well-groomed.  HEENT: [place]/AT, EOMI, MMM, anicteric sclera Neck: no adenopathy, no carotid bruit and no JVD Lungs: CTAB, normal percussion bilaterally and non-labored Heart: RRR, S1& S2 normal, no murmur, click, rub or  gallop ; nondisplaced Abdomen: soft, non-tender; bowel sounds normal; no masses,  no organomegaly; Extremities: extremities normal, atraumatic, no cyanosis, and mild edema - mild spider veins. The right inner shin has a silver dollar sized erythematous macular lesion that is firm below the surface. Pulses: roughly 1+ and thready bilaterally Neurologic: Mental status: Alert, oriented, thought content appropriate    Adult ECG Report  Rate: 79 ;  Rhythm: normal sinus rhythm and Otherwise normal axis, intervals and durations; Normal EKG  Narrative Interpretation: Stable EKG   Other studies Reviewed: Additional studies/ records that were reviewed today include:  Recent Labs:   No results found for: CHOL, HDL, LDLCALC, LDLDIRECT, TRIG, CHOLHDL   ASSESSMENT / PLAN: Problem List Items Addressed This Visit    CAD S/P percutaneous coronary angioplasty -- Taxus DES in Prox LAD - Primary (Chronic)    No recurrent anginal symptoms. Remains on aspirin, beta blocker, ARB as well as statin plus TriCor. Nonischemic Myoview in June 2009.  Checking 2-D echocardiogram to assess retinal artery plaque, but this will also tell us how his EF is based on his fatigue and occasional dyspnea.      Relevant Medications    losartan (COZAAR) 25 MG tablet   Other Relevant Orders   EKG 12-Lead (Completed)   Dyslipidemia, goal LDL below 70 (Chronic)    On combination of simvastatin plus TriCor - with now Hollenhorst plaque and coronary disease as the primary risk features.. Labs are followed by PCP. Need to be careful with liver function. No myalgias or arthralgias. However with his fatigue, this may need to be considered.      Relevant Medications   losartan (COZAAR) 25 MG tablet   Other Relevant Orders   Lipid panel   Hepatic function panel   Essential hypertension (Chronic)    We are trying to allow for permissive hypertension given his orthostatic hypotension. Plan: Discontinue Micardis HCTZ and convert to simply losartan 25 mg daily. Continue metoprolol at current dose. Ensure adequate hydration      Relevant Medications   losartan (COZAAR) 25 MG tablet   Orthostatic hypotension; with baseline hypotension (Chronic)    Allowing for permissive hypertension. On low-dose Imdur and Toprol. Stopping Micardis HCTZ to allow for some blood pressure increased. Reducing dose of converting to losartan daily.      Relevant Medications   losartan (COZAAR) 25 MG tablet   Retinal artery plaque    Unfortunately did not get the letter from his eye doctor, but this is most likely thing they would want to be evaluated. Artery on statin and fenofibrate, we will check carotid Dopplers and echocardiogram part of the "stroke workup"   Plan:   Continue current cardiovascular medications.  Check 2-D echocardiogram and carotid Dopplers.      Relevant Medications   losartan (COZAAR) 25 MG tablet   Other Relevant Orders   VAS US CAROTID   ECHOCARDIOGRAM LIMITED BUBBLE STUDY      Current medicines are reviewed at length with the patient today. (+/- concerns) Still low the positional dizziness and fatigue The following changes have been made: See below  Patient Instructions  Your physician has requested that you  have an echocardiogram with bubble study @ [address] - 3rd Floor. Echocardiography is a painless test that uses sound waves to create images of your heart. It provides your doctor with information about the size and shape of your heart and how well your heart's chambers and valves are working. This procedure takes  approximately one hour. There are no restrictions for this procedure.  Your physician has requested that you have a carotid duplex. This test is an ultrasound of the carotid arteries in your neck. It looks at blood flow through these arteries that supply the brain with blood. Allow one hour for this exam. There are no restrictions or special instructions.  Your physician recommends that you return for lab work FASTING (lipid, liver function)  Your physician has recommended you make the following change in your medication:  -- STOP micardis-hct -- START losartan '25mg'$  once daily (1-2 days after stopping micardis-hct)   Your physician recommends that you schedule a follow-up appointment in: TWO MONTHS with Dr. Ellyn Hack      Studies Ordered:   Orders Placed This Encounter  Procedures  . Lipid panel  . Hepatic function panel  . EKG 12-Lead  . ECHOCARDIOGRAM LIMITED BUBBLE STUDY      Glenetta Hew, M.D., M.S. Interventional Cardiologist   Pager # [phone] Phone # [phone] [address] [place], [place] [address]

## 2016-09-02 ENCOUNTER — Encounter: Payer: Self-pay | Admitting: Cardiology

## 2016-09-02 NOTE — Assessment & Plan Note (Signed)
We are trying to allow for permissive hypertension given his orthostatic hypotension. Plan: Discontinue Micardis HCTZ and convert to simply losartan 25 mg daily. Continue metoprolol at current dose. Ensure adequate hydration

## 2016-09-02 NOTE — Assessment & Plan Note (Signed)
On combination of simvastatin plus TriCor - with now Hollenhorst plaque and coronary disease as the primary risk features.. Labs are followed by PCP. Need to be careful with liver function. No myalgias or arthralgias. However with his fatigue, this may need to be considered.

## 2016-09-02 NOTE — Assessment & Plan Note (Signed)
Unfortunately did not get the letter from his eye doctor, but this is most likely thing they would want to be evaluated. Artery on statin and fenofibrate, we will check carotid Dopplers and echocardiogram part of the "stroke workup"   Plan:   Continue current cardiovascular medications.  Check 2-D echocardiogram and carotid Dopplers.

## 2016-09-02 NOTE — Assessment & Plan Note (Signed)
No recurrent anginal symptoms. Remains on aspirin, beta blocker, ARB as well as statin plus TriCor. Nonischemic Myoview in June 2009.  Checking 2-D echocardiogram to assess retinal artery plaque, but this will also tell us how his EF is based on his fatigue and occasional dyspnea.

## 2016-09-02 NOTE — Assessment & Plan Note (Signed)
Allowing for permissive hypertension. On low-dose Imdur and Toprol. Stopping Micardis HCTZ to allow for some blood pressure increased. Reducing dose of converting to losartan daily.

## 2016-09-08 LAB — HEPATIC FUNCTION PANEL
ALK PHOS: 76 U/L (ref 40–115)
ALT: 11 U/L (ref 9–46)
AST: 23 U/L (ref 10–35)
Albumin: 4.5 g/dL (ref 3.6–5.1)
BILIRUBIN DIRECT: 0.1 mg/dL (ref <=0.2)
BILIRUBIN INDIRECT: 0.4 mg/dL (ref 0.2–1.2)
BILIRUBIN TOTAL: 0.5 mg/dL (ref 0.2–1.2)
Total Protein: 6.8 g/dL (ref 6.1–8.1)

## 2016-09-08 LAB — LIPID PANEL
CHOL/HDL RATIO: 3.3 ratio (ref <5.0)
CHOLESTEROL: 167 mg/dL (ref <200)
HDL: 50 mg/dL (ref >40)
LDL Cholesterol: 102 mg/dL — ABNORMAL HIGH (ref <100)
Triglycerides: 76 mg/dL (ref <150)
VLDL: 15 mg/dL (ref <30)

## 2016-09-09 ENCOUNTER — Ambulatory Visit (HOSPITAL_COMMUNITY)
Admission: RE | Admit: 2016-09-09 | Discharge: 2016-09-09 | Disposition: A | Discharge status: Home / Self Care | Payer: Medicare Other | Source: Ambulatory Visit | Attending: Cardiology | Admitting: Cardiology

## 2016-09-09 DIAGNOSIS — H3509 Other intraretinal microvascular abnormalities: Secondary | ICD-10-CM | POA: Diagnosis present

## 2016-09-09 DIAGNOSIS — I6523 Occlusion and stenosis of bilateral carotid arteries: Secondary | ICD-10-CM | POA: Diagnosis not present

## 2016-09-09 DIAGNOSIS — I708 Atherosclerosis of other arteries: Secondary | ICD-10-CM | POA: Insufficient documentation

## 2016-09-15 ENCOUNTER — Telehealth: Payer: Self-pay | Admitting: Cardiology

## 2016-09-15 NOTE — Telephone Encounter (Signed)
Spoke with pt, he was asking if there is any kind of "interaction" between bubble study and his CT later. Discussed with RN and informed pt that there is no interaction having these studies on the same day.

## 2016-09-15 NOTE — Telephone Encounter (Signed)
Patient is calling with concerns about the Echocardiogram and the CT scan being scheduled on the same day. He has some concerns that they might conflict with each other. Please call to discuss, thanks.

## 2016-09-16 ENCOUNTER — Encounter (HOSPITAL_COMMUNITY): Payer: Self-pay

## 2016-09-16 ENCOUNTER — Ambulatory Visit (HOSPITAL_BASED_OUTPATIENT_CLINIC_OR_DEPARTMENT_OTHER): Payer: Medicare Other

## 2016-09-16 ENCOUNTER — Ambulatory Visit (HOSPITAL_COMMUNITY)
Admission: RE | Admit: 2016-09-16 | Discharge: 2016-09-16 | Disposition: A | Discharge status: Home / Self Care | Payer: Medicare Other | Source: Ambulatory Visit | Attending: Internal Medicine | Admitting: Internal Medicine

## 2016-09-16 ENCOUNTER — Other Ambulatory Visit (HOSPITAL_BASED_OUTPATIENT_CLINIC_OR_DEPARTMENT_OTHER): Payer: Medicare Other

## 2016-09-16 ENCOUNTER — Other Ambulatory Visit: Payer: Self-pay

## 2016-09-16 DIAGNOSIS — C3492 Malignant neoplasm of unspecified part of left bronchus or lung: Secondary | ICD-10-CM

## 2016-09-16 DIAGNOSIS — K219 Gastro-esophageal reflux disease without esophagitis: Secondary | ICD-10-CM | POA: Diagnosis not present

## 2016-09-16 DIAGNOSIS — H3509 Other intraretinal microvascular abnormalities: Secondary | ICD-10-CM | POA: Diagnosis not present

## 2016-09-16 DIAGNOSIS — E785 Hyperlipidemia, unspecified: Secondary | ICD-10-CM | POA: Insufficient documentation

## 2016-09-16 DIAGNOSIS — I10 Essential (primary) hypertension: Secondary | ICD-10-CM | POA: Diagnosis not present

## 2016-09-16 DIAGNOSIS — I708 Atherosclerosis of other arteries: Secondary | ICD-10-CM

## 2016-09-16 DIAGNOSIS — I351 Nonrheumatic aortic (valve) insufficiency: Secondary | ICD-10-CM | POA: Insufficient documentation

## 2016-09-16 DIAGNOSIS — I2510 Atherosclerotic heart disease of native coronary artery without angina pectoris: Secondary | ICD-10-CM | POA: Insufficient documentation

## 2016-09-16 DIAGNOSIS — C3432 Malignant neoplasm of lower lobe, left bronchus or lung: Secondary | ICD-10-CM

## 2016-09-16 HISTORY — PX: TRANSTHORACIC ECHOCARDIOGRAM: SHX275

## 2016-09-16 LAB — COMPREHENSIVE METABOLIC PANEL
ALT: 13 U/L (ref 0–55)
AST: 23 U/L (ref 5–34)
Albumin: 3.9 g/dL (ref 3.5–5.0)
Alkaline Phosphatase: 68 U/L (ref 40–150)
Anion Gap: 8 mEq/L (ref 3–11)
BUN: 20.5 mg/dL (ref 7.0–26.0)
CALCIUM: 9 mg/dL (ref 8.4–10.4)
CHLORIDE: 102 meq/L (ref 98–109)
CO2: 29 meq/L (ref 22–29)
Creatinine: 1.3 mg/dL (ref 0.7–1.3)
EGFR: 50 mL/min/{1.73_m2} — AB (ref >90)
Glucose: 80 mg/dl (ref 70–140)
POTASSIUM: 3.4 meq/L — AB (ref 3.5–5.1)
Sodium: 139 mEq/L (ref 136–145)
Total Bilirubin: 0.64 mg/dL (ref 0.20–1.20)
Total Protein: 6.4 g/dL (ref 6.4–8.3)

## 2016-09-16 LAB — CBC WITH DIFFERENTIAL/PLATELET
BASO%: 0.8 % (ref 0.0–2.0)
BASOS ABS: 0 10*3/uL (ref 0.0–0.1)
EOS%: 7 % (ref 0.0–7.0)
Eosinophils Absolute: 0.4 10*3/uL (ref 0.0–0.5)
HEMATOCRIT: 40.9 % (ref 38.4–49.9)
HGB: 13.8 g/dL (ref 13.0–17.1)
LYMPH#: 1.8 10*3/uL (ref 0.9–3.3)
LYMPH%: 29.6 % (ref 14.0–49.0)
MCH: 31.3 pg (ref 27.2–33.4)
MCHC: 33.7 g/dL (ref 32.0–36.0)
MCV: 93 fL (ref 79.3–98.0)
MONO#: 0.4 10*3/uL (ref 0.1–0.9)
MONO%: 6.7 % (ref 0.0–14.0)
NEUT#: 3.4 10*3/uL (ref 1.5–6.5)
NEUT%: 55.9 % (ref 39.0–75.0)
PLATELETS: 183 10*3/uL (ref 140–400)
RBC: 4.4 10*6/uL (ref 4.20–5.82)
RDW: 14.2 % (ref 11.0–14.6)
WBC: 6 10*3/uL (ref 4.0–10.3)

## 2016-09-17 NOTE — Progress Notes (Signed)
Carotid Dopplers from 09/09/2016: Essentially normal with no source for embolic event. Heterogeneous plaque less than 39% Bilateral Internal Carotids. Normal including arteries bilaterally with patent vertebral flow bilaterally.  This says recommend follow-up in 2 years. Would probably not repeat unless there is an indication.  Glenetta Hew, MD  Pls forward to PCP & Opthamologist

## 2016-09-17 NOTE — Progress Notes (Signed)
2-D echocardiogram result:  Size and function with normal EF 60-65%. Grade 1 diastolic dysfunction is normal for age. Normal valves. No PFO or ASD noted on bubble study. Nothing to correlate with potential cardioembolic source.   Please forward PCP: Dr. Elenor Quinones (and also to the patient's ophthalmologist)

## 2016-09-23 ENCOUNTER — Telehealth: Payer: Self-pay | Admitting: Internal Medicine

## 2016-09-23 ENCOUNTER — Ambulatory Visit (HOSPITAL_BASED_OUTPATIENT_CLINIC_OR_DEPARTMENT_OTHER): Payer: Medicare Other | Admitting: Internal Medicine

## 2016-09-23 ENCOUNTER — Encounter: Payer: Self-pay | Admitting: Internal Medicine

## 2016-09-23 VITALS — BP 120/64 | HR 71 | Temp 97.4°F | Resp 18 | Ht 73.0 in | Wt 220.3 lb

## 2016-09-23 DIAGNOSIS — C3492 Malignant neoplasm of unspecified part of left bronchus or lung: Secondary | ICD-10-CM

## 2016-09-23 DIAGNOSIS — C3432 Malignant neoplasm of lower lobe, left bronchus or lung: Secondary | ICD-10-CM | POA: Diagnosis not present

## 2016-09-23 NOTE — Telephone Encounter (Signed)
Appointments scheduled per 3/6 LOS. Patient given AVS report and calendars with future scheduled appointments. °

## 2016-09-23 NOTE — Progress Notes (Signed)
[facility] [phone]   Fax:([phone]) [phone]  OFFICE PROGRESS NOTE  [name], MD [place] [address]  DIAGNOSIS: Stage IIA (T2b, N0, M0) non-small cell lung cancer consistent with squamous cell carcinoma diagnosed in January 2016  PRIOR THERAPY:  1) Status post left VATS with left lower lobectomy and mediastinal lymph node dissection under the care of Dr. Prescott Gum on 08/10/2014. 2) Adjuvant systemic chemotherapy with carboplatin for AUC of 5 on day 1 and gemcitabine 1000 MG/M2 on days 1 and 8 every 3 weeks. First dose 09/19/2014.  CURRENT THERAPY: Observation.  INTERVAL HISTORY: Jeremy Deleon 83 y.o. male came to the clinic today for six-month follow-up visit. The patient is feeling fine today with no specific complaints except for mild fatigue. He denied having any chest pain, shortness of breath, cough or hemoptysis. He has no weight loss or night sweats. He has no nausea, vomiting, diarrhea or constipation. The patient had repeat CT scan of the chest performed recently and he is here for evaluation and discussion of his scan results.  MEDICAL HISTORY: Past Medical History:  Diagnosis Date  . BPH (benign prostatic hyperplasia)   . CAD S/P percutaneous coronary angioplasty January 2006   Abnormal Myoview with inferolateral ischemia: --> 80% prox LAD lesion PCI: Taxus DES 3.0 mm x 16 mm-;; Myoview June 2009 no ischemia or infarction with attenuation artifact  . Dyslipidemia, goal LDL below 70    on simvastatin and tricor  . Full code status 04/23/2015  . GERD (gastroesophageal reflux disease)   . Hypertension   . Osteoarthritis   . Squamous cell lung cancer : Left lower lobe  08/03/2014   Bronchoscopy showed extrinsic compression of the left lower lobe with endobronchial biopsies were negative. He subsequently had a needle biopsy which diagnosed the mass as squamous cell carcinoma     ALLERGIES:  is  allergic to niaspan [niacin er].  MEDICATIONS:  Current Outpatient Prescriptions  Medication Sig Dispense Refill  . acetaminophen (TYLENOL) 325 MG tablet Take 650 mg by mouth every 6 (six) hours as needed (pain).    Marland Kitchen aspirin EC 325 MG tablet Take 325 mg by mouth daily.    . fenofibrate (TRICOR) 145 MG tablet TAKE 1 TABLET BY MOUTH EVERY DAY 30 tablet 0  . fenofibrate (TRICOR) 145 MG tablet TAKE 1 TABLET(145 MG) BY MOUTH DAILY 90 tablet 1  . GENERLAC 10 GM/15ML SOLN TAKE 30ML BY MOUTH EVERY DAY 2838 mL 0  . isosorbide mononitrate (IMDUR) 30 MG 24 hr tablet TAKE 1 TABLET BY MOUTH EVERY DAY 90 tablet 0  . losartan (COZAAR) 25 MG tablet Take 1 tablet (25 mg total) by mouth daily. 90 tablet 3  . methimazole (TAPAZOLE) 10 MG tablet Take 10 mg by mouth daily.  0  . metoprolol succinate (TOPROL-XL) 25 MG 24 hr tablet TAKE 1 TABLET BY MOUTH DAILY. 90 tablet 1  . simvastatin (ZOCOR) 40 MG tablet TAKE 1 TABLET BY MOUTH EVERY DAY 30 tablet 0  . traMADol (ULTRAM) 50 MG tablet Take 50 mg by mouth every 6 (six) hours as needed.     No current facility-administered medications for this visit.     SURGICAL HISTORY:  Past Surgical History:  Procedure Laterality Date  . APPENDECTOMY    . CARDIAC CATHETERIZATION  07/2004   80% proximal LAD lesion--PCI, EF 60%  . CATARACT EXTRACTION    . DOPPLER ECHOCARDIOGRAPHY  June 18 ,2009  mild concentric LVH, NORMAL ef, MILDLY  DILATED LEFT ATRIUM  AND MILD SCLEROTIC  AORTIC  VALVE AND OTHERWISE  NORMAL  . NM MYOCAR PERF WALL MOTION  January 06, 2008    treadmill  myoview - tissue attenuation but no ischemia or infarction  . NM MYOVIEW LTD  07/2004   inferolatera wall ischemia  . PERCUTANEOUS CORONARY STENT INTERVENTION (PCI-S)  2/27/ 2006   TAXUS DES 3.0 mm x 16 mm stent ->  PROX  LAD  . renal doppler  05/03/2007   rgt and lft renal arteries normal patency; rgt and lft kidneys normal size  . UMBILICAL HERNIA REPAIR    . VIDEO ASSISTED THORACOSCOPY (VATS)/  LOBECTOMY Left 08/10/2014   Procedure: VIDEO ASSISTED THORACOSCOPY (VATS)/ LOBECTOMY;  Surgeon: Ivin Poot, MD;  Location: [facility];  Service: Thoracic;  Laterality: Left;  Marland Kitchen VIDEO BRONCHOSCOPY N/A 08/10/2014   Procedure: VIDEO BRONCHOSCOPY;  Surgeon: Ivin Poot, MD;  Location[facility] OR;  Service: Thoracic;  Laterality: N/A;    REVIEW OF SYSTEMS:  A comprehensive review of systems was negative except for: Constitutional: positive for fatigue   PHYSICAL EXAMINATION: General appearance: alert, cooperative and no distress Head: Normocephalic, without obvious abnormality, atraumatic Neck: no adenopathy, no JVD, supple, symmetrical, trachea midline and thyroid not enlarged, symmetric, no tenderness/mass/nodules Lymph nodes: Cervical, supraclavicular, and axillary nodes normal. Resp: clear to auscultation bilaterally Back: symmetric, no curvature. ROM normal. No CVA tenderness. Cardio: normal apical impulse GI: soft, non-tender; bowel sounds normal; no masses,  no organomegaly Extremities: extremities normal, atraumatic, no cyanosis or edema  ECOG PERFORMANCE STATUS: 1 - Symptomatic but completely ambulatory  Blood pressure 120/64, pulse 71, temperature 97.4 F (36.3 C), temperature source Oral, resp. rate 18, height '6\' 1"'$  (1.854 m), weight 220 lb 4.8 oz (99.9 kg), SpO2 98 %.  LABORATORY DATA: Lab Results  Component Value Date   WBC 6.0 09/16/2016   HGB 13.8 09/16/2016   HCT 40.9 09/16/2016   MCV 93.0 09/16/2016   PLT 183 09/16/2016      Chemistry      Component Value Date/Time   NA 139 09/16/2016 1246   K 3.4 (L) 09/16/2016 1246   CL 105 08/14/2014 0255   CO2 29 09/16/2016 1246   BUN 20.5 09/16/2016 1246   CREATININE 1.3 09/16/2016 1246      Component Value Date/Time   CALCIUM 9.0 09/16/2016 1246   ALKPHOS 68 09/16/2016 1246   AST 23 09/16/2016 1246   ALT 13 09/16/2016 1246   BILITOT 0.64 09/16/2016 1246       RADIOGRAPHIC STUDIES: Ct Chest Wo Contrast  Result  Date: 09/16/2016 CLINICAL DATA:  LEFT lung cancer status post lobectomy. EXAM: CT CHEST WITHOUT CONTRAST TECHNIQUE: Multidetector CT imaging of the chest was performed following the standard protocol without IV contrast. COMPARISON:  CT 02/25/2016 FINDINGS: Cardiovascular: Coronary artery calcification and aortic atherosclerotic calcification. Mediastinum/Nodes: No axillary or supraclavicular adenopathy. No mediastinal hilar adenopathy. The Lungs/Pleura: post LEFT lower lobectomy. No new nodularity. There multiple calcified nodules in the RIGHT lung consistent benign granulomas. No new pulmonary nodularity. Upper Abdomen: Limited view of the liver, kidneys, pancreas are unremarkable. Normal adrenal glands. Small gallstones noted. Musculoskeletal: No aggressive osseous lesion. IMPRESSION: 1. Non-small lung cancer recurrence. 2. Coronary artery calcification and aortic atherosclerotic calcification. Electronically Signed   By: Suzy Bouchard M.D.   On: 09/16/2016 15:55    ASSESSMENT AND PLAN:  This is a very pleasant 83 years old white male with history of  stage IIA non-small cell lung cancer, squamous cell carcinoma status post left lower lobectomy with lymph node dissection followed by adjuvant systemic chemotherapy with carboplatin and gemcitabine for 4 cycles. The patient has been observation since June 2016. His recent CT scan of the chest showed no evidence for recurrence of his lung cancer. I discussed the scan results with the patient today. I recommended for him to continue on observation with repeat CT scan of the chest in 6 months. He was advised to call immediately if he has any concerning symptoms in the interval. The patient voices understanding of current disease status and treatment options and is in agreement with the current care plan.  All questions were answered. The patient knows to call the clinic with any problems, questions or concerns. We can certainly see the patient much sooner  if necessary. I spent 10 minutes counseling the patient face to face. The total time spent in the appointment was 15 minutes.  Disclaimer: This note was dictated with voice recognition software. Similar sounding words can inadvertently be transcribed and may not be corrected upon review.

## 2016-09-24 ENCOUNTER — Ambulatory Visit (INDEPENDENT_AMBULATORY_CARE_PROVIDER_SITE_OTHER): Payer: Medicare Other | Admitting: Endocrinology

## 2016-09-24 ENCOUNTER — Encounter: Payer: Self-pay | Admitting: Endocrinology

## 2016-09-24 VITALS — BP 112/64 | HR 77 | Ht 73.0 in | Wt 221.0 lb

## 2016-09-24 DIAGNOSIS — E0590 Thyrotoxicosis, unspecified without thyrotoxic crisis or storm: Secondary | ICD-10-CM

## 2016-09-24 LAB — TSH: TSH: 2.35 u[IU]/mL (ref 0.35–4.50)

## 2016-09-24 LAB — T4, FREE: Free T4: 0.7 ng/dL (ref 0.60–1.60)

## 2016-09-24 NOTE — Progress Notes (Signed)
Subjective:    Patient ID: Jeremy Deleon, male    DOB: 1932-09-30, 83 y.o.   MRN: [id number]  HPI Pt returns for f/u of hyperthyroidism (dx'ed 2016; he has never had dedicated thyroid imaging, but Grave's dz is presumed; tapazole is chosen as rx, due to the need for frequent CT scans with contrast). pt states he feels well in general. He takes tapazole as rx'ed.  Past Medical History:  Diagnosis Date  . BPH (benign prostatic hyperplasia)   . CAD S/P percutaneous coronary angioplasty January 2006   Abnormal Myoview with inferolateral ischemia: --> 80% prox LAD lesion PCI: Taxus DES 3.0 mm x 16 mm-;; Myoview June 2009 no ischemia or infarction with attenuation artifact  . Dyslipidemia, goal LDL below 70    on simvastatin and tricor  . Full code status 04/23/2015  . GERD (gastroesophageal reflux disease)   . Hypertension   . Osteoarthritis   . Squamous cell lung cancer : Left lower lobe  08/03/2014   Bronchoscopy showed extrinsic compression of the left lower lobe with endobronchial biopsies were negative. He subsequently had a needle biopsy which diagnosed the mass as squamous cell carcinoma     Past Surgical History:  Procedure Laterality Date  . APPENDECTOMY    . CARDIAC CATHETERIZATION  07/2004   80% proximal LAD lesion--PCI, EF 60%  . CATARACT EXTRACTION    . DOPPLER ECHOCARDIOGRAPHY  June 18 ,2009   mild concentric LVH, NORMAL ef, MILDLY  DILATED LEFT ATRIUM  AND MILD SCLEROTIC  AORTIC  VALVE AND OTHERWISE  NORMAL  . NM MYOCAR PERF WALL MOTION  January 06, 2008    treadmill  myoview - tissue attenuation but no ischemia or infarction  . NM MYOVIEW LTD  07/2004   inferolatera wall ischemia  . PERCUTANEOUS CORONARY STENT INTERVENTION (PCI-S)  2/27/ 2006   TAXUS DES 3.0 mm x 16 mm stent ->  PROX  LAD  . renal doppler  05/03/2007   rgt and lft renal arteries normal patency; rgt and lft kidneys normal size  . UMBILICAL HERNIA REPAIR    . VIDEO ASSISTED THORACOSCOPY (VATS)/ LOBECTOMY Left  08/10/2014   Procedure: VIDEO ASSISTED THORACOSCOPY (VATS)/ LOBECTOMY;  Surgeon: Ivin Poot, MD;  Location: [facility];  Service: Thoracic;  Laterality: Left;  Marland Kitchen VIDEO BRONCHOSCOPY N/A 08/10/2014   Procedure: VIDEO BRONCHOSCOPY;  Surgeon: Ivin Poot, MD;  Location: [facility] OR;  Service: Thoracic;  Laterality: N/A;    Social History   Social History  . Marital status: Single    Spouse name: N/A  . Number of children: N/A  . Years of education: N/A   Occupational History  . Not on file.   Social History Main Topics  . Smoking status: Former Smoker    Types: Cigarettes    Quit date: 10/25/1982  . Smokeless tobacco: Never Used  . Alcohol use No  . Drug use: No  . Sexual activity: Not on file   Other Topics Concern  . Not on file   Social History Narrative    He is a widowed father of 65, grandfather of 38, still working. Does not smoke, quit 30   years ago and does not drink alcohol. He still works in his Nurse, learning disability for [employer].    Current Outpatient Prescriptions on File Prior to Visit  Medication Sig Dispense Refill  . acetaminophen (TYLENOL) 325 MG tablet Take 650 mg by mouth every 6 (six) hours as needed (pain).    Marland Kitchen  aspirin EC 325 MG tablet Take 325 mg by mouth daily.    . fenofibrate (TRICOR) 145 MG tablet TAKE 1 TABLET BY MOUTH EVERY DAY 30 tablet 0  . GENERLAC 10 GM/15ML SOLN TAKE 30ML BY MOUTH EVERY DAY 2838 mL 0  . isosorbide mononitrate (IMDUR) 30 MG 24 hr tablet TAKE 1 TABLET BY MOUTH EVERY DAY 90 tablet 0  . losartan (COZAAR) 25 MG tablet Take 1 tablet (25 mg total) by mouth daily. 90 tablet 3  . methimazole (TAPAZOLE) 10 MG tablet Take 10 mg by mouth daily.  0  . metoprolol succinate (TOPROL-XL) 25 MG 24 hr tablet TAKE 1 TABLET BY MOUTH DAILY. 90 tablet 1  . simvastatin (ZOCOR) 40 MG tablet TAKE 1 TABLET BY MOUTH EVERY DAY 30 tablet 0  . traMADol (ULTRAM) 50 MG tablet Take 50 mg by mouth every 6 (six) hours as needed.     No current  facility-administered medications on file prior to visit.     Allergies  Allergen Reactions  . Niaspan [Niacin Er] Other (See Comments)    flushing    Family History  Problem Relation Age of Onset  . Cancer Mother   . Heart disease Mother   . Heart disease Father   . Parkinsonism Brother   . Thyroid disease Neg Hx     BP 112/64   Pulse 77   Ht '6\' 1"'$  (1.854 m)   Wt 221 lb (100.2 kg)   SpO2 94%   BMI 29.16 kg/m    Review of Systems Denies fever.     Objective:   Physical Exam VITAL SIGNS:  See vs page. GENERAL: no distress.  NECK: There is no palpable thyroid enlargement.  No thyroid nodule is palpable.  No palpable lymphadenopathy at the anterior neck.    Lab Results  Component Value Date   TSH 2.35 09/24/2016      Assessment & Plan:  Hyperthyroidism: well-controlled.  Please continue the same medication

## 2016-09-24 NOTE — Patient Instructions (Signed)
Thyroid blood tests are requested for you today.  We'll let you know about the results. if ever you have fever while taking methimazole, stop it and call us, even if the reason is obvious, because of the risk of a rare side-effect. Please come back for a follow-up appointment in 6 months.

## 2016-10-07 ENCOUNTER — Emergency Department (HOSPITAL_COMMUNITY)
Admission: EM | Admit: 2016-10-07 | Discharge: 2016-10-07 | Disposition: A | Discharge status: Home / Self Care | Payer: Medicare Other

## 2016-10-07 ENCOUNTER — Emergency Department (HOSPITAL_BASED_OUTPATIENT_CLINIC_OR_DEPARTMENT_OTHER)
Admission: EM | Admit: 2016-10-07 | Discharge: 2016-10-07 | Disposition: A | Discharge status: Home / Self Care | Payer: Medicare Other | Attending: Emergency Medicine | Admitting: Emergency Medicine

## 2016-10-07 ENCOUNTER — Emergency Department (HOSPITAL_BASED_OUTPATIENT_CLINIC_OR_DEPARTMENT_OTHER): Payer: Medicare Other

## 2016-10-07 ENCOUNTER — Encounter (HOSPITAL_BASED_OUTPATIENT_CLINIC_OR_DEPARTMENT_OTHER): Payer: Self-pay | Admitting: Emergency Medicine

## 2016-10-07 DIAGNOSIS — R0981 Nasal congestion: Secondary | ICD-10-CM | POA: Diagnosis not present

## 2016-10-07 DIAGNOSIS — W19XXXA Unspecified fall, initial encounter: Secondary | ICD-10-CM

## 2016-10-07 DIAGNOSIS — Y939 Activity, unspecified: Secondary | ICD-10-CM | POA: Diagnosis not present

## 2016-10-07 DIAGNOSIS — Z7982 Long term (current) use of aspirin: Secondary | ICD-10-CM | POA: Diagnosis not present

## 2016-10-07 DIAGNOSIS — M546 Pain in thoracic spine: Secondary | ICD-10-CM

## 2016-10-07 DIAGNOSIS — R42 Dizziness and giddiness: Secondary | ICD-10-CM | POA: Diagnosis not present

## 2016-10-07 DIAGNOSIS — Z87891 Personal history of nicotine dependence: Secondary | ICD-10-CM | POA: Diagnosis not present

## 2016-10-07 DIAGNOSIS — Y999 Unspecified external cause status: Secondary | ICD-10-CM | POA: Diagnosis not present

## 2016-10-07 DIAGNOSIS — N390 Urinary tract infection, site not specified: Secondary | ICD-10-CM | POA: Diagnosis not present

## 2016-10-07 DIAGNOSIS — W06XXXA Fall from bed, initial encounter: Secondary | ICD-10-CM | POA: Insufficient documentation

## 2016-10-07 DIAGNOSIS — I2510 Atherosclerotic heart disease of native coronary artery without angina pectoris: Secondary | ICD-10-CM | POA: Insufficient documentation

## 2016-10-07 DIAGNOSIS — S299XXA Unspecified injury of thorax, initial encounter: Secondary | ICD-10-CM | POA: Diagnosis present

## 2016-10-07 DIAGNOSIS — I10 Essential (primary) hypertension: Secondary | ICD-10-CM | POA: Diagnosis not present

## 2016-10-07 DIAGNOSIS — Y92009 Unspecified place in unspecified non-institutional (private) residence as the place of occurrence of the external cause: Secondary | ICD-10-CM | POA: Diagnosis not present

## 2016-10-07 LAB — URINALYSIS, MICROSCOPIC (REFLEX)

## 2016-10-07 LAB — URINALYSIS, ROUTINE W REFLEX MICROSCOPIC
GLUCOSE, UA: NEGATIVE mg/dL
Hgb urine dipstick: NEGATIVE
Ketones, ur: NEGATIVE mg/dL
NITRITE: NEGATIVE
PH: 6 (ref 5.0–8.0)
PROTEIN: 30 mg/dL — AB
Specific Gravity, Urine: 1.026 (ref 1.005–1.030)

## 2016-10-07 LAB — CBC WITH DIFFERENTIAL/PLATELET
BASOS ABS: 0 10*3/uL (ref 0.0–0.1)
BASOS PCT: 0 %
Eosinophils Absolute: 0.3 10*3/uL (ref 0.0–0.7)
Eosinophils Relative: 3 %
HEMATOCRIT: 42.6 % (ref 39.0–52.0)
Hemoglobin: 14.3 g/dL (ref 13.0–17.0)
Lymphocytes Relative: 8 %
Lymphs Abs: 0.9 10*3/uL (ref 0.7–4.0)
MCH: 31.4 pg (ref 26.0–34.0)
MCHC: 33.6 g/dL (ref 30.0–36.0)
MCV: 93.4 fL (ref 78.0–100.0)
MONO ABS: 0.9 10*3/uL (ref 0.1–1.0)
Monocytes Relative: 9 %
NEUTROS ABS: 8.3 10*3/uL — AB (ref 1.7–7.7)
NEUTROS PCT: 80 %
Platelets: 181 10*3/uL (ref 150–400)
RBC: 4.56 MIL/uL (ref 4.22–5.81)
RDW: 14.6 % (ref 11.5–15.5)
WBC: 10.5 10*3/uL (ref 4.0–10.5)

## 2016-10-07 LAB — BASIC METABOLIC PANEL
ANION GAP: 9 (ref 5–15)
BUN: 21 mg/dL — ABNORMAL HIGH (ref 6–20)
CALCIUM: 9 mg/dL (ref 8.9–10.3)
CHLORIDE: 99 mmol/L — AB (ref 101–111)
CO2: 27 mmol/L (ref 22–32)
Creatinine, Ser: 1.38 mg/dL — ABNORMAL HIGH (ref 0.61–1.24)
GFR calc non Af Amer: 46 mL/min — ABNORMAL LOW (ref >60)
GFR, EST AFRICAN AMERICAN: 54 mL/min — AB (ref >60)
GLUCOSE: 124 mg/dL — AB (ref 65–99)
POTASSIUM: 4.1 mmol/L (ref 3.5–5.1)
Sodium: 135 mmol/L (ref 135–145)

## 2016-10-07 MED ORDER — FENTANYL CITRATE (PF) 100 MCG/2ML IJ SOLN
75.0000 ug | Freq: Once | INTRAMUSCULAR | Status: AC
  Administered 2016-10-07: 75 ug via INTRAVENOUS
  Filled 2016-10-07: qty 2

## 2016-10-07 MED ORDER — CEPHALEXIN 500 MG PO CAPS: 500.0000 mg | ORAL_CAPSULE | Freq: Two times a day (BID) | ORAL | 0 refills | Status: DC

## 2016-10-07 MED ORDER — CEPHALEXIN 250 MG PO CAPS
500.0000 mg | ORAL_CAPSULE | Freq: Once | ORAL | Status: AC
  Administered 2016-10-07: 500 mg via ORAL
  Filled 2016-10-07: qty 2

## 2016-10-07 MED ORDER — HYDROCODONE-ACETAMINOPHEN 5-325 MG PO TABS: 1.0000 | ORAL_TABLET | Freq: Four times a day (QID) | ORAL | 0 refills | Status: DC | PRN

## 2016-10-07 MED ORDER — SODIUM CHLORIDE 0.9 % IV BOLUS (SEPSIS)
1000.0000 mL | Freq: Once | INTRAVENOUS | Status: AC
  Administered 2016-10-07: 1000 mL via INTRAVENOUS

## 2016-10-07 NOTE — ED Triage Notes (Signed)
Pt fell this morning at home. Pt reports feeling dizzy and weak prior to falling. Pt was unable to get up on his own. Pt c/o pain under shoulder blades and has several small skin tears to left forearm.

## 2016-10-07 NOTE — ED Provider Notes (Signed)
[facility] Provider Note   CSN: [id number] Arrival date & time: 10/07/16  2002   By signing my name below, I, Eunice Blase, attest that this documentation has been prepared under the direction and in the presence of Forde Dandy, MD. Electronically signed, Eunice Blase, ED Scribe. 10/07/16. 9:20 PM.  History   Chief Complaint Chief Complaint  Patient presents with  . Fall   The history is provided by the patient, medical records and a relative. No language interpreter was used.    HPI Comments: Jeremy Deleon is a 83 y.o. male who presents to the Emergency Department s/p an unwitnessed fall that occurred this morning at home ~10:30 AM. He states he fell as he attempted to step forward while getting out of bed. States he stood up quickly and got dizzy. No syncope.  Pt believes he fell on his back but he denies head trauma or LOC. He currently c/o upper back pain under the shoulder blades bilaterally. States he has been sitting in a chair all day and not walking due to pain in upper back. Took tramadol but no good effect.   Pt reportedly has not walked since his fall, and family members add he has decreased food intake today. Not eaten anything all day. Baseline urinary frequency noted; he states he urinates frequently at night. Pt takes aspirin regularly at home. Pt denies Hx of significant dizzy spells, N/V/D, chest pain, SOB, cough, rhinorrhea, nasal congestion, headache, confusion, chest pain, neck pain, abdominal pain, low extremity pain, pelvic pain, numbness, weakness    Past Medical History:  Diagnosis Date  . BPH (benign prostatic hyperplasia)   . CAD S/P percutaneous coronary angioplasty January 2006   Abnormal Myoview with inferolateral ischemia: --> 80% prox LAD lesion PCI: Taxus DES 3.0 mm x 16 mm-;; Myoview June 2009 no ischemia or infarction with attenuation artifact  . Dyslipidemia, goal LDL below 70    on simvastatin and tricor  . Full code status  04/23/2015  . GERD (gastroesophageal reflux disease)   . Hypertension   . Osteoarthritis   . Squamous cell lung cancer : Left lower lobe  08/03/2014   Bronchoscopy showed extrinsic compression of the left lower lobe with endobronchial biopsies were negative. He subsequently had a needle biopsy which diagnosed the mass as squamous cell carcinoma     Patient Active Problem List   Diagnosis Date Noted  . Retinal artery plaque 09/01/2016  . Hyperthyroidism 07/26/2015  . Full code status 04/23/2015  . Orthostatic hypotension; with baseline hypotension 11/13/2014  . Cellulitis 10/17/2014  . Chemotherapy induced neutropenia ([place]) 10/17/2014  . S/P lobectomy of lung 08/10/2014  . Squamous cell lung cancer : Left lower lobe  08/03/2014  . Dyslipidemia, goal LDL below 70 10/25/2013  . CAD S/P percutaneous coronary angioplasty -- Taxus DES in Prox LAD 10/25/2013  . Essential hypertension     Past Surgical History:  Procedure Laterality Date  . APPENDECTOMY    . CARDIAC CATHETERIZATION  07/2004   80% proximal LAD lesion--PCI, EF 60%  . CATARACT EXTRACTION    . DOPPLER ECHOCARDIOGRAPHY  June 18 ,2009   mild concentric LVH, NORMAL ef, MILDLY  DILATED LEFT ATRIUM  AND MILD SCLEROTIC  AORTIC  VALVE AND OTHERWISE  NORMAL  . NM MYOCAR PERF WALL MOTION  January 06, 2008    treadmill  myoview - tissue attenuation but no ischemia or infarction  . NM MYOVIEW LTD  07/2004   inferolatera wall ischemia  .  PERCUTANEOUS CORONARY STENT INTERVENTION (PCI-S)  2/27/ 2006   TAXUS DES 3.0 mm x 16 mm stent ->  PROX  LAD  . renal doppler  05/03/2007   rgt and lft renal arteries normal patency; rgt and lft kidneys normal size  . UMBILICAL HERNIA REPAIR    . VIDEO ASSISTED THORACOSCOPY (VATS)/ LOBECTOMY Left 08/10/2014   Procedure: VIDEO ASSISTED THORACOSCOPY (VATS)/ LOBECTOMY;  Surgeon: Ivin Poot, MD;  Location: [facility];  Service: Thoracic;  Laterality: Left;  Marland Kitchen VIDEO BRONCHOSCOPY N/A 08/10/2014   Procedure: VIDEO  BRONCHOSCOPY;  Surgeon: Ivin Poot, MD;  Location: [facility] OR;  Service: Thoracic;  Laterality: N/A;       Home Medications    Prior to Admission medications   Medication Sig Start Date End Date Taking? Authorizing Provider  acetaminophen (TYLENOL) 325 MG tablet Take 650 mg by mouth every 6 (six) hours as needed (pain).    Historical Provider, MD  aspirin EC 325 MG tablet Take 325 mg by mouth daily.    Historical Provider, MD  cephALEXin (KEFLEX) 500 MG capsule Take 1 capsule (500 mg total) by mouth 2 (two) times daily. 10/07/16   Forde Dandy, MD  fenofibrate (TRICOR) 145 MG tablet TAKE 1 TABLET BY MOUTH EVERY DAY 08/20/15   Leonie Man, MD  [facility] 10 GM/15ML SOLN TAKE 30ML BY MOUTH EVERY DAY 07/04/16   Irene Shipper, MD  HYDROcodone-acetaminophen (NORCO/VICODIN) 5-325 MG tablet Take 1 tablet by mouth every 6 (six) hours as needed. 10/07/16   Forde Dandy, MD  isosorbide mononitrate (IMDUR) 30 MG 24 hr tablet TAKE 1 TABLET BY MOUTH EVERY DAY 08/28/16   Leonie Man, MD  losartan (COZAAR) 25 MG tablet Take 1 tablet (25 mg total) by mouth daily. 09/01/16 11/30/16  Leonie Man, MD  methimazole (TAPAZOLE) 10 MG tablet Take 10 mg by mouth daily. 02/29/16   Historical Provider, MD  metoprolol succinate (TOPROL-XL) 25 MG 24 hr tablet TAKE 1 TABLET BY MOUTH DAILY. 10/26/14   Leonie Man, MD  simvastatin (ZOCOR) 40 MG tablet TAKE 1 TABLET BY MOUTH EVERY DAY 08/20/15   Leonie Man, MD  traMADol (ULTRAM) 50 MG tablet Take 50 mg by mouth every 6 (six) hours as needed. 12/25/15   Historical Provider, MD    Family History Family History  Problem Relation Age of Onset  . Cancer Mother   . Heart disease Mother   . Heart disease Father   . Parkinsonism Brother   . Thyroid disease Neg Hx     Social History Social History  Substance Use Topics  . Smoking status: Former Smoker    Types: Cigarettes    Quit date: 10/25/1982  . Smokeless tobacco: Never Used  . Alcohol use No      Allergies   Niaspan [niacin er]   Review of Systems Review of Systems  Constitutional: Positive for appetite change.  HENT: Positive for congestion.   Respiratory: Negative for cough and shortness of breath.   Cardiovascular: Negative for chest pain.  Gastrointestinal: Negative for abdominal pain, diarrhea, nausea and vomiting.  Genitourinary: Positive for frequency.  Musculoskeletal: Positive for arthralgias, gait problem and myalgias. Negative for back pain, neck pain and neck stiffness.  Allergic/Immunologic: Negative for immunocompromised state.  Neurological: Positive for dizziness. Negative for weakness, numbness and headaches.  Psychiatric/Behavioral: Negative for confusion.  All other systems reviewed and are negative.   Physical Exam Updated Vital Signs BP (!) 112/58 (BP Location: Left Arm)  Pulse 74   Temp 98.1 F (36.7 C) (Oral)   Resp 18   Ht '6\' 2"'$  (1.88 m)   Wt 208 lb (94.3 kg)   SpO2 98%   BMI 26.71 kg/m   Physical Exam Physical Exam  Nursing note and vitals reviewed. Constitutional: Well developed, well nourished, non-toxic, and in no acute distress Head: Normocephalic and atraumatic.  Mouth/Throat: Oropharynx is clear and moist.  Neck: Normal range of motion. Neck supple. No cervical spine tenderness. Cardiovascular: Normal rate and regular rhythm.   Pulmonary/Chest: Effort normal and breath sounds normal.  Abdominal: Soft. There is no tenderness. There is no rebound and no guarding.  Musculoskeletal: Normal range of motion of all four extremities. TTP over the mid to lower thoracic spine without deformities.   Neurological:  Alert, oriented to person, place, time, and situation. Memory grossly in tact. Fluent speech. No dysarthria or aphasia.  Cranial nerves:EOMI without nystagmus. No gaze deviation. Facial muscles symmetric with activation. Sensation to light touch over face in tact bilaterally. Hearing grossly in tact. Palate elevates  symmetrically. Head turn and shoulder shrug are intact. Tongue midline.  Reflexes defered.  Muscle bulk and tone normal. No pronator drift. Moves all extremities symmetrically. Sensation to light touch is in tact throughout in bilateral upper and lower extremities..  Skin: Skin is warm and dry.  Psychiatric: Cooperative   ED Treatments / Results  DIAGNOSTIC STUDIES: Oxygen Saturation is 98% on RA, normal by my interpretation.    COORDINATION OF CARE: 9:15 PM Discussed treatment plan with pt at bedside and pt agreed to plan. Will order imaging and medications and then reassess.  Labs (all labs ordered are listed, but only abnormal results are displayed) Labs Reviewed  CBC WITH DIFFERENTIAL/PLATELET - Abnormal; Notable for the following:       Result Value   Neutro Abs 8.3 (*)    All other components within normal limits  BASIC METABOLIC PANEL - Abnormal; Notable for the following:    Chloride 99 (*)    Glucose, Bld 124 (*)    BUN 21 (*)    Creatinine, Ser 1.38 (*)    GFR calc non Af Amer 46 (*)    GFR calc Af Amer 54 (*)    All other components within normal limits  URINALYSIS, ROUTINE W REFLEX MICROSCOPIC - Abnormal; Notable for the following:    Color, Urine AMBER (*)    APPearance CLOUDY (*)    Bilirubin Urine SMALL (*)    Protein, ur 30 (*)    Leukocytes, UA LARGE (*)    All other components within normal limits  URINALYSIS, MICROSCOPIC (REFLEX) - Abnormal; Notable for the following:    Bacteria, UA FEW (*)    Squamous Epithelial / LPF 6-30 (*)    All other components within normal limits  URINE CULTURE    EKG  EKG Interpretation  Date/Time:  Tuesday October 07 2016 20:14:12 EDT Ventricular Rate:  74 PR Interval:  188 QRS Duration: 90 QT Interval:  406 QTC Calculation: 450 R Axis:   10 Text Interpretation:  Normal sinus rhythm Minimal voltage criteria for LVH, may be normal variant Cannot rule out Anterior infarct , age undetermined Abnormal ECG no acute changes   Confirmed by Shataya Winkles MD, Sharea Guinther [phone]) on 10/07/2016 8:44:58 PM       Radiology Dg Chest 2 View  Result Date: 10/07/2016 CLINICAL DATA:  83 year old male with fall and low thoracic back pain. EXAM: CHEST  2 VIEW COMPARISON:  Thoracic spine  CT dated 10/07/2016 and chest CT dated 09/16/2016 FINDINGS: There is mild stable eventration of the left hemidiaphragm. Small right upper lobe subpleural calcified granuloma measuring approximately 7 mm. The lungs are clear. There is no pleural effusion or pneumothorax. The cardiac silhouette is within normal limits. There is osteopenia with degenerative changes of the spine. No acute osseous pathology identified. IMPRESSION: No acute cardiopulmonary process. No definite acute/traumatic osseous pathology. Osteopenia. Electronically Signed   By: Anner Crete M.D.   On: 10/07/2016 21:44   Ct Thoracic Spine Wo Contrast  Result Date: 10/07/2016 CLINICAL DATA:  Fall, mid back pain.  Denies weakness history of lung cancer EXAM: CT THORACIC SPINE WITHOUT CONTRAST TECHNIQUE: Multidetector CT images of the thoracic were obtained using the standard protocol without intravenous contrast. COMPARISON:  Chest CT 09/16/2016 FINDINGS: Alignment: Normal alignment of the vertebral bodies. Vertebrae: No loss vertebral body height and disc height. There is continuous osteophytosis in the mid thoracic spine. Normal facet articulation. Bulky osteophytosis in the lower cervical spine including image Paraspinal and other soft tissues: No epidural paraspinal hematoma. Disc levels: Unremarkable Calcified granuloma in the RIGHT lower lobe IMPRESSION: 1. No acute findings of the cervical spine. 2. Continue osteophytosis of the thoracic spine anteriorly. Electronically Signed   By: Suzy Bouchard M.D.   On: 10/07/2016 21:43    Procedures Procedures (including critical care time)  Medications Ordered in ED Medications  cephALEXin (KEFLEX) capsule 500 mg (not administered)  sodium chloride  0.9 % bolus 1,000 mL (1,000 mLs Intravenous New Bag/Given 10/07/16 2205)  fentaNYL (SUBLIMAZE) injection 75 mcg (75 mcg Intravenous Given 10/07/16 2205)     Initial Impression / Assessment and Plan / ED Course  I have reviewed the triage vital signs and the nursing notes.  Pertinent labs & imaging results that were available during my care of the patient were reviewed by me and considered in my medical decision making (see chart for details).     83 year old male who presents after fall with upper back pain and difficulty walking. He is nontoxic in no acute distress. Vital signs are within normal limits. He has no focal neurological deficits on exam. Does have upper thoracic midline spine tenderness to palpation without step-offs or deformities. CT thoracic spine obtained, visualized and does not show acute fractures. Received fentanyl for pain, to good effect, and subsequently is able to ambulate steadily without any complaints or problems.  Blood work are reassuring, without leukocytosis, electrolyte or metabolic derangements, kidney injury, or other abnormalities. Chest x-ray visualized and does not show evidence of pneumonia or other acute processes. UA may be suggestive of infection. Given generalized weakness earlier today, we'll treat with course of Keflex. Urine is sent for culture.  Given that he feels improved and is ambulatory with stable vital signs and abdominal mental status, I do feel like he is appropriate for outpatient management. Strict return and follow-up instructions reviewed. He and family expressed understanding of all discharge instructions and felt comfortable with the plan of care.  Final Clinical Impressions(s) / ED Diagnoses   Final diagnoses:  Fall, initial encounter  Lower urinary tract infectious disease  Acute midline thoracic back pain    New Prescriptions New Prescriptions   CEPHALEXIN (KEFLEX) 500 MG CAPSULE    Take 1 capsule (500 mg total) by mouth 2  (two) times daily.   HYDROCODONE-ACETAMINOPHEN (NORCO/VICODIN) 5-325 MG TABLET    Take 1 tablet by mouth every 6 (six) hours as needed.   I personally performed the services  described in this documentation, which was scribed in my presence. The recorded information has been reviewed and is accurate.     Forde Dandy, MD 10/07/16 [phone]

## 2016-10-07 NOTE — ED Notes (Signed)
No answer when called 

## 2016-10-07 NOTE — Discharge Instructions (Signed)
Please take antibiotics for potential UTI.  Your CT does not show broken back bones but this may be muscle strain or bruising. Take norco for breakthrough pain. Take Miralax with this daily because it will make you constipated.  Please follow-up closely with your primary care doctor for recheck.  Return for worsening symptoms, including fever, confusion, inability to walk, recurrent falls or any other symptoms concerning to you.

## 2016-10-08 ENCOUNTER — Other Ambulatory Visit: Payer: Self-pay

## 2016-10-08 DIAGNOSIS — Z9861 Coronary angioplasty status: Principal | ICD-10-CM

## 2016-10-08 DIAGNOSIS — I2510 Atherosclerotic heart disease of native coronary artery without angina pectoris: Secondary | ICD-10-CM

## 2016-10-08 DIAGNOSIS — E785 Hyperlipidemia, unspecified: Secondary | ICD-10-CM

## 2016-10-08 MED ORDER — ATORVASTATIN CALCIUM 40 MG PO TABS
40.0000 mg | ORAL_TABLET | Freq: Every day | ORAL | 6 refills | Status: DC
Start: 2016-10-08 — End: 2017-09-14

## 2016-10-09 ENCOUNTER — Telehealth: Payer: Self-pay | Admitting: Cardiology

## 2016-10-09 DIAGNOSIS — Z79899 Other long term (current) drug therapy: Secondary | ICD-10-CM

## 2016-10-09 LAB — URINE CULTURE

## 2016-10-09 NOTE — Telephone Encounter (Signed)
Returned call to patient-states he was called by someone yesterday and told to stop a medication and begin a new one and is confused.  Per chart review:  Notes recorded by Luanna Salk, LPN on 2/83/6629 at 4:76 PM EDT Patient called results given.Advised to stop simvastatin and start atorvastatin 40 mg daily.Repeat fasting lipid and hepatic panels in 4 months.Lab order mailed. ------  Notes Recorded by Leonie Man, MD on 09/11/2016 at 11:27 AM EST Normal liver function. Levels show that LDL is 102 - that is above our goal. I would like to see if we can switch him from simvastatin 40 mg 2 atorvastatin 40 mg and recheck in 4 months.  Glenetta Hew, MD    Patient aware and verbalized understanding.

## 2016-10-09 NOTE — Telephone Encounter (Signed)
Patient calling, states that he was advised to discontinue one medication but he doesn't remember which medication. Please call to discuss, thanks.

## 2016-10-30 ENCOUNTER — Ambulatory Visit (INDEPENDENT_AMBULATORY_CARE_PROVIDER_SITE_OTHER): Payer: Medicare Other | Admitting: Cardiology

## 2016-10-30 ENCOUNTER — Encounter: Payer: Self-pay | Admitting: Cardiology

## 2016-10-30 VITALS — BP 122/74 | HR 76 | Ht 73.0 in | Wt 219.2 lb

## 2016-10-30 DIAGNOSIS — I951 Orthostatic hypotension: Secondary | ICD-10-CM

## 2016-10-30 DIAGNOSIS — Z9861 Coronary angioplasty status: Secondary | ICD-10-CM | POA: Diagnosis not present

## 2016-10-30 DIAGNOSIS — I708 Atherosclerosis of other arteries: Secondary | ICD-10-CM | POA: Diagnosis not present

## 2016-10-30 DIAGNOSIS — E785 Hyperlipidemia, unspecified: Secondary | ICD-10-CM | POA: Diagnosis not present

## 2016-10-30 DIAGNOSIS — H3509 Other intraretinal microvascular abnormalities: Secondary | ICD-10-CM | POA: Diagnosis not present

## 2016-10-30 DIAGNOSIS — I2510 Atherosclerotic heart disease of native coronary artery without angina pectoris: Secondary | ICD-10-CM | POA: Diagnosis not present

## 2016-10-30 NOTE — Patient Instructions (Signed)
MEDICATION CHANGE  --STOP LOSARTAN     NO OTHER CHANGES    Your physician wants you to follow-up in: 4-6 MONTHS WITH DR HARDING. You will receive a reminder letter in the mail two months in advance. If you don't receive a letter, please call our office to schedule the follow-up appointment.   If you need a refill on your cardiac medications before your next appointment, please call your pharmacy.

## 2016-10-30 NOTE — Progress Notes (Signed)
PCP: [name], MD  Clinic Note: Chief Complaint  Patient presents with  . Follow-up    pt states not heart type chest pain but some soreness from a fall last week   . Shortness of Breath    a little    HPI: Jeremy Deleon is a 83 y.o. male with a PMH below who presents today for Two-month follow-up history of coronary artery disease.Jeremy Deleon was last seen on 09/01/2016. He was noticing that he felt lazy with poor energy. Additional dizziness little bit of orthostatic symptoms. He was told that he may have something wrong with his right eye and it sounded like possible Hollenhorst plaque. No visual symptoms reported. He did note dyspnea on exertion. We were allowing for permissive hypertension therefore stopped his Micardis-HCTZ and started low-dose losartan. Continue metoprolol. Encouraged hydration. Continue Imdur. Check 2-D echo with bubble study and carotid Dopplers.  Recent Hospitalizations: ER visit on March 20 for a fall  Studies Reviewed:   2-D Echo with Bubble 09/16/2016: Normal LV function EF 60-65%. GR 1 DD. No PFO or ASD noted. Otherwise normal.  Carotid Dopplers 09/09/2016:< 39% Plaque vertebral arteries  Interval History: Jeremy Deleon was doing relatively well from a CV standpoint until he had a fall back on 3/20 -- was getting out of bed b/c he was feeling a bit nauseated & thought he may need to get to the bathroom to vomit.  Unfortunately, in his rush, he probably got up too quickly & promptly became woozy & collapsed backwards & landed on the carpet (no head injury & no LOC - just near syncope).  He has been having quite a lot of back & rib cage pain since the fall.  Not doing hardly anything active & not sleeping well.  He is a bit more tired than usual & has been sitting in his recliner most of the day. He denies any rest or exertional anginal CP - just the global chest wall pain.  A bit hard to take a deep breath, so he does get "winded" more easily - also b/c  pain.   No PND, orthopnea or notable edema - mild stable dependent edema that goes down overnight or with elevating the feet.   Does not walk enough to have leg aching. No TIA or amaurosis fugax Sx - nothing correlating with the "eye abnormality".   No vision abnormalities. No palpitations, or rapid irregular HR.  No claudication.  ROS: A comprehensive was performed. Review of Systems  Constitutional: Positive for malaise/fatigue ("set back a bit" after the fall. Not sleeping well b/c pain.  Not up to doing much b/c pain.).  HENT: Negative for congestion and nosebleeds.   Respiratory: Negative for cough, shortness of breath and wheezing.   Cardiovascular: Positive for chest pain (chest & back pain from fall).  Gastrointestinal: Negative for blood in stool, heartburn and melena.  Genitourinary: Negative for hematuria.  Musculoskeletal: Positive for falls and joint pain (Usual bilateral Knee pain).  Neurological: Positive for dizziness.       Near Syncope - no LOC  Endo/Heme/Allergies: Bruises/bleeds easily.  Psychiatric/Behavioral: Positive for memory loss.  All other systems reviewed and are negative.   Past Medical History:  Diagnosis Date  . BPH (benign prostatic hyperplasia)   . CAD S/P percutaneous coronary angioplasty January 2006   Abnormal Myoview with inferolateral ischemia: --> 80% prox LAD lesion PCI: Taxus DES 3.0 mm x 16 mm-;; Myoview June 2009 no ischemia or infarction with  attenuation artifact  . Dyslipidemia, goal LDL below 70    on simvastatin and tricor  . Full code status 04/23/2015  . GERD (gastroesophageal reflux disease)   . Hypertension   . Osteoarthritis   . Squamous cell lung cancer : Left lower lobe  08/03/2014   Bronchoscopy showed extrinsic compression of the left lower lobe with endobronchial biopsies were negative. He subsequently had a needle biopsy which diagnosed the mass as squamous cell carcinoma     Past Surgical History:  Procedure Laterality  Date  . APPENDECTOMY    . CARDIAC CATHETERIZATION  07/2004   80% proximal LAD lesion--PCI, EF 60%  . CAROTID DOPPLERS  09/2016   < 39% Plaque vertebral arteries  . CATARACT EXTRACTION    . DOPPLER ECHOCARDIOGRAPHY  June 18 ,2009   mild concentric LVH, NORMAL ef, MILDLY  DILATED LEFT ATRIUM  AND MILD SCLEROTIC  AORTIC  VALVE AND OTHERWISE  NORMAL  . NM MYOCAR PERF WALL MOTION  January 06, 2008    treadmill  myoview - tissue attenuation but no ischemia or infarction  . NM MYOVIEW LTD  07/2004   inferolatera wall ischemia  . PERCUTANEOUS CORONARY STENT INTERVENTION (PCI-S)  2/27/ 2006   TAXUS DES 3.0 mm x 16 mm stent ->  PROX  LAD  . renal doppler  05/03/2007   rgt and lft renal arteries normal patency; rgt and lft kidneys normal size  . TRANSTHORACIC ECHOCARDIOGRAM  09/2016   WITH BUBBLE STUDY: Normal LV function EF 60-65%. GR 1 DD. No PFO or ASD noted. Otherwise normal.  . UMBILICAL HERNIA REPAIR    . VIDEO ASSISTED THORACOSCOPY (VATS)/ LOBECTOMY Left 08/10/2014   Procedure: VIDEO ASSISTED THORACOSCOPY (VATS)/ LOBECTOMY;  Surgeon: Ivin Poot, MD;  Location: [facility];  Service: Thoracic;  Laterality: Left;  Marland Kitchen VIDEO BRONCHOSCOPY N/A 08/10/2014   Procedure: VIDEO BRONCHOSCOPY;  Surgeon: Ivin Poot, MD;  Location: [facility] OR;  Service: Thoracic;  Laterality: N/A;    Current Meds  Medication Sig  . acetaminophen (TYLENOL) 325 MG tablet Take 650 mg by mouth every 6 (six) hours as needed (pain).  Marland Kitchen aspirin EC 325 MG tablet Take 325 mg by mouth daily.  Marland Kitchen atorvastatin (LIPITOR) 40 MG tablet Take 1 tablet (40 mg total) by mouth daily.  . cephALEXin (KEFLEX) 500 MG capsule Take 1 capsule (500 mg total) by mouth 2 (two) times daily.  . fenofibrate (TRICOR) 145 MG tablet TAKE 1 TABLET BY MOUTH EVERY DAY  . GENERLAC 10 GM/15ML SOLN TAKE 30ML BY MOUTH EVERY DAY  . HYDROcodone-acetaminophen (NORCO/VICODIN) 5-325 MG tablet Take 1 tablet by mouth every 6 (six) hours as needed.  . isosorbide mononitrate  (IMDUR) 30 MG 24 hr tablet TAKE 1 TABLET BY MOUTH EVERY DAY  . methimazole (TAPAZOLE) 10 MG tablet Take 10 mg by mouth daily.  . metoprolol succinate (TOPROL-XL) 25 MG 24 hr tablet TAKE 1 TABLET BY MOUTH DAILY.  . traMADol (ULTRAM) 50 MG tablet Take 50 mg by mouth every 6 (six) hours as needed.  . [DISCONTINUED] losartan (COZAAR) 25 MG tablet Take 1 tablet (25 mg total) by mouth daily.    Allergies  Allergen Reactions  . Niaspan [Niacin Er] Other (See Comments)    flushing    Social History   Social History  . Marital status: Single    Spouse name: N/A  . Number of children: N/A  . Years of education: N/A   Social History Main Topics  . Smoking status: Former  Smoker    Types: Cigarettes    Quit date: 10/25/1982  . Smokeless tobacco: Never Used  . Alcohol use No  . Drug use: No  . Sexual activity: Not Asked   Other Topics Concern  . None   Social History Narrative    He is a widowed father of 71, grandfather of 31, still working. Does not smoke, quit 30   years ago and does not drink alcohol. He still works in his Nurse, learning disability for [employer].    family history includes Cancer in his mother; Heart disease in his father and mother; Parkinsonism in his brother.  Wt Readings from Last 3 Encounters:  10/30/16 219 lb 3.2 oz (99.4 kg)  10/07/16 208 lb (94.3 kg)  09/24/16 221 lb (100.2 kg)    PHYSICAL EXAM BP 122/74   Pulse 76   Ht '6\' 1"'$  (1.854 m)   Wt 219 lb 3.2 oz (99.4 kg)   SpO2 94%   BMI 28.92 kg/m  General appearance: alert, cooperative, appears stated age, no distress and borderline obese. Well nourished & well groomed. Pleasant mood & affect. Neck: no adenopathy, no carotid bruit and no JVD Lungs: CTAB, normal percussion bilaterally and non-labored Heart: RRR,  S1& S2 normal, no murmur, click, rub or gallop; non-displaced PMI Abdomen: soft, non-tender; bowel sounds normal; no masses,  no organomegaly; No HJR Extremities: extremities normal,  atraumatic, no cyanosis, and edema -trace Pulses: 2+ and symmetric;  Neurologic: Mental status: Alert, oriented, thought content appropriate    Adult ECG Report n/a  Other studies Reviewed: Additional studies/ records that were reviewed today include:  Recent Labs:  Discussed last visit. Lab Results  Component Value Date   CHOL 167 09/08/2016   HDL 50 09/08/2016   LDLCALC 102 (H) 09/08/2016   TRIG 76 09/08/2016   CHOLHDL 3.3 09/08/2016    Lab Results  Component Value Date   CREATININE 1.38 (H) 10/07/2016   BUN 21 (H) 10/07/2016   NA 135 10/07/2016   K 4.1 10/07/2016   CL 99 (L) 10/07/2016   CO2 27 10/07/2016    ASSESSMENT / PLAN: Problem List Items Addressed This Visit    CAD S/P percutaneous coronary angioplasty -- Taxus DES in Prox LAD (Chronic)    Stable - no angina on low dose Toprol & Imdur. On stating & Fibrate. Normal Echo w/o RWMA. Negative Myoview in 2009.  Continue to exercise as tolerated - once he recovers from the fall, he should be able to return to his baseline activity level.      Dyslipidemia, goal LDL below 70 (Chronic)    Converted to Atorvastatin + Tricor (need to closely follow liver Fx & monitor for myalgias). Despite aggressive Rx - last lipids from February were not @ goal. If recheck levels continue to be low, would have low threshold to consider PCSK9 Inbitor.      Orthostatic hypotension; with baseline hypotension (Chronic)    Changed from Micardic-HCTZ to low dose Losartan last visit, but still had a fall 2/2 low BP. Plan: d/c ARB altogether - allow permissive HTN. Continue current Toprol dose & PM Imdur. Bedside bottle of water & no BP med until after a meal.      Retinal artery plaque    Negative Echo with Bubble & minimal carotid disease. - no obvious embolic source.. Suspect atheroembolic --> continue statin + fenofibrate as long as no myalgias.         Current medicines are reviewed at length with  the patient today. (+/-  concerns) n/a The following changes have been made: n/a  Patient Instructions  MEDICATION CHANGE  --STOP LOSARTAN     NO OTHER CHANGES    Your physician wants you to follow-up in: 4-6 MONTHS WITH DR Ricarda Atayde. You will receive a reminder letter in the mail two months in advance. If you don't receive a letter, please call our office to schedule the follow-up appointment.   If you need a refill on your cardiac medications before your next appointment, please call your pharmacy.   Studies Ordered:   No orders of the defined types were placed in this encounter.     Glenetta Hew, M.D., M.S. Interventional Cardiologist   Pager # [phone] Phone # [phone] [address] [place], [place] [address]

## 2016-10-31 ENCOUNTER — Encounter: Payer: Self-pay | Admitting: Cardiology

## 2016-10-31 NOTE — Assessment & Plan Note (Signed)
Negative Echo with Bubble & minimal carotid disease. - no obvious embolic source.. Suspect atheroembolic --> continue statin + fenofibrate as long as no myalgias.

## 2016-10-31 NOTE — Assessment & Plan Note (Signed)
Converted to Atorvastatin + Tricor (need to closely follow liver Fx & monitor for myalgias). Despite aggressive Rx - last lipids from February were not @ goal. If recheck levels continue to be low, would have low threshold to consider PCSK9 Inbitor.

## 2016-10-31 NOTE — Assessment & Plan Note (Signed)
Changed from Micardic-HCTZ to low dose Losartan last visit, but still had a fall 2/2 low BP. Plan: d/c ARB altogether - allow permissive HTN. Continue current Toprol dose & PM Imdur. Bedside bottle of water & no BP med until after a meal.

## 2016-10-31 NOTE — Assessment & Plan Note (Signed)
Stable - no angina on low dose Toprol & Imdur. On stating & Fibrate. Normal Echo w/o RWMA. Negative Myoview in 2009.  Continue to exercise as tolerated - once he recovers from the fall, he should be able to return to his baseline activity level.

## 2016-11-25 ENCOUNTER — Other Ambulatory Visit: Payer: Self-pay | Admitting: Cardiology

## 2016-11-26 NOTE — Telephone Encounter (Signed)
REFILL 

## 2016-12-10 ENCOUNTER — Other Ambulatory Visit: Payer: Self-pay | Admitting: Cardiology

## 2016-12-10 ENCOUNTER — Other Ambulatory Visit: Payer: Self-pay | Admitting: Endocrinology

## 2016-12-11 NOTE — Telephone Encounter (Signed)
Rx has been sent to the pharmacy electronically. ° °

## 2017-02-12 ENCOUNTER — Telehealth: Payer: Self-pay | Admitting: Cardiology

## 2017-02-12 NOTE — Telephone Encounter (Signed)
Spoke with patient and informed him to come a week or two before his next appointment with Dr Ellyn Hack to have labs drawn at our office. Informed patient to fast to the morning of his appointment. Patient voiced understanding. Labs in chart.

## 2017-02-12 NOTE — Telephone Encounter (Signed)
New message     Does pt need to have lab work before his next appt ?

## 2017-02-28 ENCOUNTER — Encounter (HOSPITAL_BASED_OUTPATIENT_CLINIC_OR_DEPARTMENT_OTHER): Payer: Self-pay | Admitting: *Deleted

## 2017-02-28 ENCOUNTER — Emergency Department (HOSPITAL_BASED_OUTPATIENT_CLINIC_OR_DEPARTMENT_OTHER): Payer: Medicare Other

## 2017-02-28 ENCOUNTER — Emergency Department (HOSPITAL_BASED_OUTPATIENT_CLINIC_OR_DEPARTMENT_OTHER)
Admission: EM | Admit: 2017-02-28 | Discharge: 2017-02-28 | Disposition: A | Discharge status: Home / Self Care | Payer: Medicare Other | Attending: Emergency Medicine | Admitting: Emergency Medicine

## 2017-02-28 DIAGNOSIS — Z85118 Personal history of other malignant neoplasm of bronchus and lung: Secondary | ICD-10-CM | POA: Insufficient documentation

## 2017-02-28 DIAGNOSIS — I10 Essential (primary) hypertension: Secondary | ICD-10-CM | POA: Insufficient documentation

## 2017-02-28 DIAGNOSIS — R070 Pain in throat: Secondary | ICD-10-CM | POA: Diagnosis present

## 2017-02-28 DIAGNOSIS — Z87891 Personal history of nicotine dependence: Secondary | ICD-10-CM | POA: Diagnosis not present

## 2017-02-28 DIAGNOSIS — Z79899 Other long term (current) drug therapy: Secondary | ICD-10-CM | POA: Diagnosis not present

## 2017-02-28 DIAGNOSIS — Z7982 Long term (current) use of aspirin: Secondary | ICD-10-CM | POA: Diagnosis not present

## 2017-02-28 DIAGNOSIS — Z955 Presence of coronary angioplasty implant and graft: Secondary | ICD-10-CM | POA: Diagnosis not present

## 2017-02-28 DIAGNOSIS — I2510 Atherosclerotic heart disease of native coronary artery without angina pectoris: Secondary | ICD-10-CM | POA: Diagnosis not present

## 2017-02-28 MED ORDER — GI COCKTAIL ~~LOC~~
30.0000 mL | Freq: Once | ORAL | Status: AC
  Administered 2017-02-28: 30 mL via ORAL
  Filled 2017-02-28: qty 30

## 2017-02-28 NOTE — ED Triage Notes (Signed)
Patient reports that he woke up tonight and he feels like he has something in his throat. States when he went to sleep last night he did have a sore throat. Denies fevers or SOB.

## 2017-02-28 NOTE — ED Provider Notes (Signed)
[facility] Provider Note   CSN: [id number] Arrival date & time: 02/28/17  0249     History   Chief Complaint No chief complaint on file.   HPI Jeremy Deleon is a 83 y.o. male.  HPI Patient is a 34-year-old male who presents to emergency department with a complaint of fullness and discomfort in his anterior neck and throat.  He states that when he went to bed he had a slightly scratchy throat when he awoke he felt that it might be something in his throat.  He ate dinner and had no difficulty eating dinner.  He had no choking or coughing.  No shortness of breath.  No chest pain, chest tightness, chest pressure at this time.  No arm or back pain.  No jaw pain.  No prior history of gastroesophageal reflux disease.  He can drink fluids at the bedside without any difficulty.  Symptoms are mild in severity.   Past Medical History:  Diagnosis Date  . BPH (benign prostatic hyperplasia)   . CAD S/P percutaneous coronary angioplasty January 2006   Abnormal Myoview with inferolateral ischemia: --> 80% prox LAD lesion PCI: Taxus DES 3.0 mm x 16 mm-;; Myoview June 2009 no ischemia or infarction with attenuation artifact  . Dyslipidemia, goal LDL below 70    on simvastatin and tricor  . Full code status 04/23/2015  . GERD (gastroesophageal reflux disease)   . Hypertension   . Osteoarthritis   . Squamous cell lung cancer : Left lower lobe  08/03/2014   Bronchoscopy showed extrinsic compression of the left lower lobe with endobronchial biopsies were negative. He subsequently had a needle biopsy which diagnosed the mass as squamous cell carcinoma     Patient Active Problem List   Diagnosis Date Noted  . Retinal artery plaque 09/01/2016  . Hyperthyroidism 07/26/2015  . Full code status 04/23/2015  . Orthostatic hypotension; with baseline hypotension 11/13/2014  . Cellulitis 10/17/2014  . Chemotherapy induced neutropenia ([place]) 10/17/2014  . S/P lobectomy of lung 08/10/2014  .  Squamous cell lung cancer : Left lower lobe  08/03/2014  . Dyslipidemia, goal LDL below 70 10/25/2013  . CAD S/P percutaneous coronary angioplasty -- Taxus DES in Prox LAD 10/25/2013  . Essential hypertension     Past Surgical History:  Procedure Laterality Date  . APPENDECTOMY    . CARDIAC CATHETERIZATION  07/2004   80% proximal LAD lesion--PCI, EF 60%  . CAROTID DOPPLERS  09/2016   < 39% Plaque vertebral arteries  . CATARACT EXTRACTION    . DOPPLER ECHOCARDIOGRAPHY  June 18 ,2009   mild concentric LVH, NORMAL ef, MILDLY  DILATED LEFT ATRIUM  AND MILD SCLEROTIC  AORTIC  VALVE AND OTHERWISE  NORMAL  . NM MYOCAR PERF WALL MOTION  January 06, 2008    treadmill  myoview - tissue attenuation but no ischemia or infarction  . NM MYOVIEW LTD  07/2004   inferolatera wall ischemia  . PERCUTANEOUS CORONARY STENT INTERVENTION (PCI-S)  2/27/ 2006   TAXUS DES 3.0 mm x 16 mm stent ->  PROX  LAD  . renal doppler  05/03/2007   rgt and lft renal arteries normal patency; rgt and lft kidneys normal size  . TRANSTHORACIC ECHOCARDIOGRAM  09/2016   WITH BUBBLE STUDY: Normal LV function EF 60-65%. GR 1 DD. No PFO or ASD noted. Otherwise normal.  . UMBILICAL HERNIA REPAIR    . VIDEO ASSISTED THORACOSCOPY (VATS)/ LOBECTOMY Left 08/10/2014   Procedure: VIDEO ASSISTED THORACOSCOPY (VATS)/  LOBECTOMY;  Surgeon: Ivin Poot, MD;  Location: [facility];  Service: Thoracic;  Laterality: Left;  Marland Kitchen VIDEO BRONCHOSCOPY N/A 08/10/2014   Procedure: VIDEO BRONCHOSCOPY;  Surgeon: Ivin Poot, MD;  Location: [facility] OR;  Service: Thoracic;  Laterality: N/A;       Home Medications    Prior to Admission medications   Medication Sig Start Date End Date Taking? Authorizing Provider  acetaminophen (TYLENOL) 325 MG tablet Take 650 mg by mouth every 6 (six) hours as needed (pain).    [provider]  aspirin EC 325 MG tablet Take 325 mg by mouth daily.    [provider]  atorvastatin (LIPITOR) 40 MG tablet Take 1  tablet (40 mg total) by mouth daily. 10/08/16 01/06/17  Leonie Man, MD  cephALEXin (KEFLEX) 500 MG capsule Take 1 capsule (500 mg total) by mouth 2 (two) times daily. 10/07/16   Forde Dandy, MD  fenofibrate (TRICOR) 145 MG tablet TAKE 1 TABLET BY MOUTH EVERY DAY 08/20/15   Leonie Man, MD  fenofibrate (TRICOR) 145 MG tablet TAKE 1 TABLET(145 MG) BY MOUTH DAILY 11/26/16   Leonie Man, MD  [facility] 10 GM/15ML SOLN TAKE 30ML BY MOUTH EVERY DAY 07/04/16   Irene Shipper, MD  HYDROcodone-acetaminophen (NORCO/VICODIN) 5-325 MG tablet Take 1 tablet by mouth every 6 (six) hours as needed. 10/07/16   Forde Dandy, MD  isosorbide mononitrate (IMDUR) 30 MG 24 hr tablet TAKE 1 TABLET BY MOUTH EVERY DAY 12/11/16   Leonie Man, MD  methimazole (TAPAZOLE) 10 MG tablet Take 10 mg by mouth daily. 02/29/16   [provider]  methimazole (TAPAZOLE) 10 MG tablet TAKE 1 TABLET BY MOUTH TWICE DAILY 12/10/16   Renato Shin, MD  metoprolol succinate (TOPROL-XL) 25 MG 24 hr tablet TAKE 1 TABLET BY MOUTH DAILY. 10/26/14   Leonie Man, MD  traMADol (ULTRAM) 50 MG tablet Take 50 mg by mouth every 6 (six) hours as needed. 12/25/15   [provider]    Family History Family History  Problem Relation Age of Onset  . Cancer Mother   . Heart disease Mother   . Heart disease Father   . Parkinsonism Brother   . Thyroid disease Neg Hx     Social History Social History  Substance Use Topics  . Smoking status: Former Smoker    Types: Cigarettes    Quit date: 10/25/1982  . Smokeless tobacco: Never Used  . Alcohol use No     Allergies   Niaspan [niacin er]   Review of Systems Review of Systems  All other systems reviewed and are negative.    Physical Exam Updated Vital Signs BP (!) 179/84 (BP Location: Right Arm)   Pulse 77   Temp 98 F (36.7 C) (Oral)   Resp 18   Ht 6\' 1"  (1.854 m)   Wt 95.3 kg (210 lb)   SpO2 97%   BMI 27.71 kg/m   Physical Exam  Constitutional: He  is oriented to person, place, and time. He appears well-developed. No distress.  HENT:  Head: Normocephalic.  Edentulous.  No gingival swelling or point tenderness.  Anterior neck is normal.  No erythema or swelling of the anterior neck.  Voice is normal.  No stridor or hoarseness appreciated.  Posterior pharynx is normal.  Tolerating secretions.  Eyes: EOM are normal.  Neck: Normal range of motion.  Cardiovascular: Normal rate and regular rhythm.   Pulmonary/Chest: Effort normal and breath sounds  normal.  Abdominal: There is no tenderness.  Musculoskeletal: Normal range of motion.  Neurological: He is alert and oriented to person, place, and time.  Skin: Skin is warm.  Psychiatric: He has a normal mood and affect.  Nursing note and vitals reviewed.    ED Treatments / Results  Labs (all labs ordered are listed, but only abnormal results are displayed) Labs Reviewed - No data to display  EKG  EKG Interpretation None       Radiology Dg Neck Soft Tissue  Result Date: 02/28/2017 CLINICAL DATA:  Acute onset of sore throat. Sensation of object in throat. EXAM: NECK SOFT TISSUES - 1+ VIEW COMPARISON:  PET/CT performed 06/29/2014 FINDINGS: There is distention of the hypopharynx. No radiopaque foreign bodies are seen. The epiglottis is normal in thickness. The proximal trachea is unremarkable. Prevertebral soft tissues are within normal limits. Anterior bridging osteophytes are noted along the lower cervical spine. Minimal intervertebral disc space narrowing is noted along the mid cervical spine. There is no evidence of acute fracture or subluxation along the cervical spine. The visualized lung apices are clear. IMPRESSION: Distention of the hypopharynx is likely transient in nature. No radiopaque foreign bodies seen. Prevertebral soft tissues are within normal limits. The epiglottis is normal in thickness. Electronically Signed   By: Garald Balding M.D.   On: 02/28/2017 03:46     Procedures Procedures (including critical care time)  Medications Ordered in ED Medications  gi cocktail (Maalox,Lidocaine,Donnatal) (30 mLs Oral Given 02/28/17 0328)     Initial Impression / Assessment and Plan / ED Course  I have reviewed the triage vital signs and the nursing notes.  Pertinent labs & imaging results that were available during my care of the patient were reviewed by me and considered in my medical decision making (see chart for details).     Posterior pharynx is normal.  Soft tissue neck is without abnormality.  Tolerating secretions.  Able to drink a couple water at the bedside without difficulty.  Vital signs are stable.  I do not think he needs advanced imaging or any additional testing at this time.  He is well-appearing.  This may represent pharyngitis developing laryngitis.  He and his son understand to return to the emergency department for new or worsening symptoms.  Final Clinical Impressions(s) / ED Diagnoses   Final diagnoses:  Throat pain    New Prescriptions New Prescriptions   No medications on file     Jola Schmidt, MD 02/28/17 [phone]

## 2017-03-06 LAB — LIPID PANEL
Chol/HDL Ratio: 3.2 ratio (ref 0.0–5.0)
Cholesterol, Total: 140 mg/dL (ref 100–199)
HDL: 44 mg/dL (ref >39)
LDL Calculated: 76 mg/dL (ref 0–99)
Triglycerides: 102 mg/dL (ref 0–149)
VLDL Cholesterol Cal: 20 mg/dL (ref 5–40)

## 2017-03-06 LAB — HEPATIC FUNCTION PANEL
ALT: 14 IU/L (ref 0–44)
AST: 26 IU/L (ref 0–40)
Albumin: 4.4 g/dL (ref 3.5–4.7)
Alkaline Phosphatase: 89 IU/L (ref 39–117)
BILIRUBIN TOTAL: 0.5 mg/dL (ref 0.0–1.2)
Bilirubin, Direct: 0.2 mg/dL (ref 0.00–0.40)
Total Protein: 6.4 g/dL (ref 6.0–8.5)

## 2017-03-06 NOTE — Addendum Note (Signed)
Addended by: Waylan Rocher on: 03/06/2017 11:11 AM   Modules accepted: Orders

## 2017-03-16 ENCOUNTER — Encounter: Payer: Self-pay | Admitting: Cardiology

## 2017-03-16 ENCOUNTER — Ambulatory Visit (INDEPENDENT_AMBULATORY_CARE_PROVIDER_SITE_OTHER): Payer: Medicare Other | Admitting: Cardiology

## 2017-03-16 VITALS — BP 102/58 | HR 72 | Ht 73.0 in | Wt 216.0 lb

## 2017-03-16 DIAGNOSIS — Z9861 Coronary angioplasty status: Secondary | ICD-10-CM

## 2017-03-16 DIAGNOSIS — I2510 Atherosclerotic heart disease of native coronary artery without angina pectoris: Secondary | ICD-10-CM

## 2017-03-16 DIAGNOSIS — I10 Essential (primary) hypertension: Secondary | ICD-10-CM | POA: Diagnosis not present

## 2017-03-16 DIAGNOSIS — I951 Orthostatic hypotension: Secondary | ICD-10-CM

## 2017-03-16 DIAGNOSIS — E785 Hyperlipidemia, unspecified: Secondary | ICD-10-CM | POA: Diagnosis not present

## 2017-03-16 MED ORDER — ISOSORBIDE MONONITRATE ER 30 MG PO TB24: 30.0000 mg | ORAL_TABLET | Freq: Every day | ORAL | 3 refills | Status: DC

## 2017-03-16 NOTE — Patient Instructions (Signed)
Medication changes  Atorvastatin stop taking for 1 month - if your energy level get better  Decrease medication to 1/2 tablet daily (total 20 mg). If your energy does not change it is not the atorvastatin-- continue the same dose.  Isosorbide mononitrate was refilled.     Your physician wants you to follow-up in 6 months with Dr Ellyn Hack. You will receive a reminder letter in the mail two months in advance. If you don't receive a letter, please call our office to schedule the follow-up appointment.   If you need a refill on your cardiac medications before your next appointment, please call your pharmacy.

## 2017-03-16 NOTE — Progress Notes (Signed)
PCP: [name], MD  Clinic Note: Chief Complaint  Patient presents with  . Follow-up    low Energy  . Coronary Artery Disease    HPI: Jeremy Deleon is a 83 y.o. male with a PMH below who presents today for four-month follow-up for coronary disease in with dizziness.  Jeremy Deleon was last seen In April 2018, he was doing relatively well but suffered a fall getting out of bed disparate for that. We had evaluated him with a Myoview stress test and because of the retinal artery plaque we ordered by echo and bubble study.  Recent Hospitalizations: Went to emergency room for throat pain on August 11. Has been doing some thyroid issues since.  Studies Personally Reviewed - (if available, images/films reviewed: From Epic Chart or Care Everywhere)  2-D echo: February 2018: Normal LV size and function with EF of 65%. GR 1 DD. Negative bubble study. No regional wall motion abnormality.  Interval History: Torrion presents today overall presyncopal from a cardiac standpoint. He is no longer taking his beta blocker, and has run out of his isosorbide as of this morning. For the most part all he really notes is very poor energy. He is just not having the energy to do things he wants to do. Because he is not doing very much he denies any chest tightness or pressure rest or exertion. He does have some dizziness with the post to fast, but this is improved since backing off the beta blocker.  No PND, orthopnea with mild dependent edema. No syncope/near syncope or TIA/amaurosis fugax symptoms.   No chest pain or shortness of breath with rest or exertion. No PND, orthopnea or edema. No palpitations, lightheadedness, dizziness, weakness or syncope/near syncope. No TIA/amaurosis fugax symptoms. No melena, hematochezia, hematuria, or epstaxis. No claudication.  ROS: A comprehensive was performed. Pertinent symptoms noted above. Review of Systems  Constitutional: Positive for malaise/fatigue.  HENT:  Negative for nosebleeds.   Respiratory: Positive for shortness of breath (If he overdoes it).   Cardiovascular: Negative for chest pain and palpitations.  Gastrointestinal: Positive for abdominal pain and constipation. Negative for blood in stool and melena.  Genitourinary: Negative for hematuria.  Musculoskeletal: Negative for falls (No recent falls).  Psychiatric/Behavioral:       Difficult to determine if he is describing symptoms as it dysthymia.  All other systems reviewed and are negative.  I have reviewed and (if needed) personally updated the patient's problem list, medications, allergies, past medical and surgical history, social and family history.   Past Medical History:  Diagnosis Date  . BPH (benign prostatic hyperplasia)   . CAD S/P percutaneous coronary angioplasty January 2006   Abnormal Myoview with inferolateral ischemia: --> 80% prox LAD lesion PCI: Taxus DES 3.0 mm x 16 mm-;; Myoview June 2009 no ischemia or infarction with attenuation artifact  . Dyslipidemia, goal LDL below 70    on simvastatin and tricor  . Full code status 04/23/2015  . GERD (gastroesophageal reflux disease)   . Hypertension   . Osteoarthritis   . Squamous cell lung cancer : Left lower lobe  08/03/2014   Bronchoscopy showed extrinsic compression of the left lower lobe with endobronchial biopsies were negative. He subsequently had a needle biopsy which diagnosed the mass as squamous cell carcinoma     Past Surgical History:  Procedure Laterality Date  . APPENDECTOMY    . CARDIAC CATHETERIZATION  07/2004   80% proximal LAD lesion--PCI, EF 60%  . CAROTID  DOPPLERS  09/2016   < 39% Plaque vertebral arteries  . CATARACT EXTRACTION    . NM MYOVIEW LTD  07/2004   inferolatera wall ischemia  . NM MYOVIEW LTD  January 06, 2008    treadmill  myoview - tissue attenuation but no ischemia or infarction  . PERCUTANEOUS CORONARY STENT INTERVENTION (PCI-S)  2/27/ 2006   TAXUS DES 3.0 mm x 16 mm stent ->  PROX   LAD  . renal doppler  05/03/2007   rgt and lft renal arteries normal patency; rgt and lft kidneys normal size  . TRANSTHORACIC ECHOCARDIOGRAM  08/2016   WITH BUBBLE STUDY: Normal LV function EF 60-65%. GR 1 DD. No PFO or ASD noted. Otherwise normal.  . TRANSTHORACIC ECHOCARDIOGRAM  08/2016   Normal LV size and function with EF of 65%. GR 1 DD. Negative bubble study. No regional wall motion abnormality.  . TRANSTHORACIC ECHOCARDIOGRAM  June 18 ,2009   mild concentric LVH, NORMAL ef, MILDLY  DILATED LEFT ATRIUM  AND MILD SCLEROTIC  AORTIC  VALVE AND OTHERWISE  NORMAL  . UMBILICAL HERNIA REPAIR    . VIDEO ASSISTED THORACOSCOPY (VATS)/ LOBECTOMY Left 08/10/2014   Procedure: VIDEO ASSISTED THORACOSCOPY (VATS)/ LOBECTOMY;  Surgeon: Ivin Poot, MD;  Location: [facility];  Service: Thoracic;  Laterality: Left;  Marland Kitchen VIDEO BRONCHOSCOPY N/A 08/10/2014   Procedure: VIDEO BRONCHOSCOPY;  Surgeon: Ivin Poot, MD;  Location: [facility] OR;  Service: Thoracic;  Laterality: N/A;    Current Meds  Medication Sig  . aspirin EC 325 MG tablet Take 325 mg by mouth daily.  Marland Kitchen atorvastatin (LIPITOR) 40 MG tablet Take 1 tablet (40 mg total) by mouth daily.  . fenofibrate (TRICOR) 145 MG tablet TAKE 1 TABLET(145 MG) BY MOUTH DAILY  . GENERLAC 10 GM/15ML SOLN TAKE 30ML BY MOUTH EVERY DAY  . methimazole (TAPAZOLE) 10 MG tablet TAKE 1 TABLET BY MOUTH TWICE DAILY  . traMADol (ULTRAM) 50 MG tablet Take 50 mg by mouth every 6 (six) hours as needed.    Allergies  Allergen Reactions  . Niaspan [Niacin Er] Other (See Comments)    flushing    Social History   Social History  . Marital status: Single    Spouse name: N/A  . Number of children: N/A  . Years of education: N/A   Social History Main Topics  . Smoking status: Former Smoker    Types: Cigarettes    Quit date: 10/25/1982  . Smokeless tobacco: Never Used  . Alcohol use No  . Drug use: No  . Sexual activity: Not Asked   Other Topics Concern  . None   Social  History Narrative    He is a widowed father of 38, grandfather of 92, still working. Does not smoke, quit 30   years ago and does not drink alcohol. He still works in his Nurse, learning disability for [employer].    family history includes Cancer in his mother; Heart disease in his father and mother; Parkinsonism in his brother.  Wt Readings from Last 3 Encounters:  03/16/17 216 lb (98 kg)  02/28/17 210 lb (95.3 kg)  10/30/16 219 lb 3.2 oz (99.4 kg)    PHYSICAL EXAM BP (!) 102/58 (BP Location: Left Arm, Patient Position: Sitting, Cuff Size: Normal)   Pulse 72   Ht 6\' 1"  (1.854 m)   Wt 216 lb (98 kg)   BMI 28.50 kg/m  Physical Exam  Constitutional: He is oriented to person, place, and time. He appears  well-developed and well-nourished. No distress.  Well-groomed. Pleasant mood and affect  HENT:  Head: Normocephalic and atraumatic.  Wears glasses  Eyes: Pupils are equal, round, and reactive to light. EOM are normal.  Neck: Normal range of motion. Neck supple. No hepatojugular reflux and no JVD present. Carotid bruit is not present. No thyromegaly present.  Cardiovascular: Normal rate, regular rhythm and intact distal pulses.  Exam reveals no gallop and no friction rub.   No murmur heard. Pulmonary/Chest: Effort normal. No respiratory distress. He has no wheezes. He has no rales.  Abdominal: Soft. Bowel sounds are normal. He exhibits no distension. There is no tenderness. There is no rebound.  Musculoskeletal: Normal range of motion. He exhibits no edema.  Neurological: He is alert and oriented to person, place, and time.  Skin: Skin is warm and dry.  Psychiatric: He has a normal mood and affect.    Adult ECG Report n/a  Other studies Reviewed: Additional studies/ records that were reviewed today include:  Recent Labs:  n/a  Lab Results  Component Value Date   CHOL 140 03/06/2017   HDL 44 03/06/2017   LDLCALC 76 03/06/2017   TRIG 102 03/06/2017   CHOLHDL 3.2  03/06/2017    ASSESSMENT / PLAN: Problem List Items Addressed This Visit    CAD S/P percutaneous coronary angioplasty -- Taxus DES in Prox LAD - Primary (Chronic)    Stable, no angina. Because of fatigue, he is no longer on beta blocker, his rate is still controlled as his blood pressure. He remains on Imdur, but is out of his current prescription. We will refill.  Preserved EF by echo. No regional wall motion and amount is. PCL is long enough away that he is only on aspirin and statin.      Relevant Medications   isosorbide mononitrate (IMDUR) 30 MG 24 hr tablet   Dyslipidemia, goal LDL below 70 (Chronic)    He is now on atorvastatin plus TriCor. With him having fatigue and energy issues, see how he does off of statin. It could be one or both of the medications that could be contributing to her poor energy. If this does help, we would need to potentially consider going to rosuvastatin or even Livalo.  If symptoms improve, I have asked the reduced to one half tablet and see how he does. We can discuss on follow-up.      Relevant Medications   isosorbide mononitrate (IMDUR) 30 MG 24 hr tablet   Essential hypertension (Chronic)    A bit low today. Again we are trying to allow for some permissive hypertension as necessary. We stopped his Micardis HCTZ and then switch him to losartan which was found the stopped. He says we stopped his beta blocker, and has not had any tachycardic episodes.  Probably not true diagnosis for him.      Relevant Medications   isosorbide mononitrate (IMDUR) 30 MG 24 hr tablet   Orthostatic hypotension; with baseline hypotension (Chronic)    Now essentially off all blood pressure medications besides Imdur.      Relevant Medications   isosorbide mononitrate (IMDUR) 30 MG 24 hr tablet      Current medicines are reviewed at length with the patient today. (+/- concerns) still notes fatigue, has stopped BB. The following changes have been made: see  below.  Patient Instructions  Medication changes  Atorvastatin stop taking for 1 month - if your energy level get better  Decrease medication to 1/2 tablet daily (total 20  mg). If your energy does not change it is not the atorvastatin-- continue the same dose.  Isosorbide mononitrate was refilled.     Your physician wants you to follow-up in 6 months with Dr Ellyn Hack. You will receive a reminder letter in the mail two months in advance. If you don't receive a letter, please call our office to schedule the follow-up appointment.   If you need a refill on your cardiac medications before your next appointment, please call your pharmacy.     Studies Ordered:   No orders of the defined types were placed in this encounter.     Glenetta Hew, M.D., M.S. Interventional Cardiologist   Pager # [phone] Phone # [phone] [address] [place], [place] [address]

## 2017-03-18 ENCOUNTER — Encounter: Payer: Self-pay | Admitting: Cardiology

## 2017-03-18 NOTE — Assessment & Plan Note (Signed)
Now essentially off all blood pressure medications besides Imdur.

## 2017-03-18 NOTE — Assessment & Plan Note (Signed)
A bit low today. Again we are trying to allow for some permissive hypertension as necessary. We stopped his Micardis HCTZ and then switch him to losartan which was found the stopped. He says we stopped his beta blocker, and has not had any tachycardic episodes.  Probably not true diagnosis for him.

## 2017-03-18 NOTE — Assessment & Plan Note (Addendum)
He is now on atorvastatin plus TriCor. With him having fatigue and energy issues, see how he does off of statin. It could be one or both of the medications that could be contributing to her poor energy. If this does help, we would need to potentially consider going to rosuvastatin or even Livalo.  If symptoms improve, I have asked the reduced to one half tablet and see how he does. We can discuss on follow-up.

## 2017-03-18 NOTE — Assessment & Plan Note (Signed)
Stable, no angina. Because of fatigue, he is no longer on beta blocker, his rate is still controlled as his blood pressure. He remains on Imdur, but is out of his current prescription. We will refill.  Preserved EF by echo. No regional wall motion and amount is. PCL is long enough away that he is only on aspirin and statin.

## 2017-03-24 ENCOUNTER — Other Ambulatory Visit (HOSPITAL_BASED_OUTPATIENT_CLINIC_OR_DEPARTMENT_OTHER): Payer: Medicare Other

## 2017-03-24 DIAGNOSIS — C3432 Malignant neoplasm of lower lobe, left bronchus or lung: Secondary | ICD-10-CM | POA: Diagnosis not present

## 2017-03-24 DIAGNOSIS — C3492 Malignant neoplasm of unspecified part of left bronchus or lung: Secondary | ICD-10-CM

## 2017-03-24 LAB — CBC WITH DIFFERENTIAL/PLATELET
BASO%: 1.1 % (ref 0.0–2.0)
Basophils Absolute: 0.1 10*3/uL (ref 0.0–0.1)
EOS%: 5.4 % (ref 0.0–7.0)
Eosinophils Absolute: 0.4 10*3/uL (ref 0.0–0.5)
HCT: 44.9 % (ref 38.4–49.9)
HGB: 15.3 g/dL (ref 13.0–17.1)
LYMPH%: 30.2 % (ref 14.0–49.0)
MCH: 31.4 pg (ref 27.2–33.4)
MCHC: 34 g/dL (ref 32.0–36.0)
MCV: 92.3 fL (ref 79.3–98.0)
MONO#: 0.6 10*3/uL (ref 0.1–0.9)
MONO%: 9.1 % (ref 0.0–14.0)
NEUT%: 54.2 % (ref 39.0–75.0)
NEUTROS ABS: 3.7 10*3/uL (ref 1.5–6.5)
PLATELETS: 181 10*3/uL (ref 140–400)
RBC: 4.87 10*6/uL (ref 4.20–5.82)
RDW: 15.6 % — ABNORMAL HIGH (ref 11.0–14.6)
WBC: 6.9 10*3/uL (ref 4.0–10.3)
lymph#: 2.1 10*3/uL (ref 0.9–3.3)

## 2017-03-24 LAB — COMPREHENSIVE METABOLIC PANEL
ALT: 17 U/L (ref 0–55)
ANION GAP: 7 meq/L (ref 3–11)
AST: 30 U/L (ref 5–34)
Albumin: 3.8 g/dL (ref 3.5–5.0)
Alkaline Phosphatase: 87 U/L (ref 40–150)
BILIRUBIN TOTAL: 0.56 mg/dL (ref 0.20–1.20)
BUN: 16.5 mg/dL (ref 7.0–26.0)
CO2: 25 meq/L (ref 22–29)
CREATININE: 1.2 mg/dL (ref 0.7–1.3)
Calcium: 9.6 mg/dL (ref 8.4–10.4)
Chloride: 109 mEq/L (ref 98–109)
EGFR: 58 mL/min/{1.73_m2} — ABNORMAL LOW (ref >90)
GLUCOSE: 73 mg/dL (ref 70–140)
Potassium: 4.2 mEq/L (ref 3.5–5.1)
SODIUM: 141 meq/L (ref 136–145)
TOTAL PROTEIN: 7 g/dL (ref 6.4–8.3)

## 2017-03-25 ENCOUNTER — Telehealth: Payer: Self-pay | Admitting: *Deleted

## 2017-03-25 NOTE — Telephone Encounter (Signed)
Oncology Nurse Navigator Documentation  Oncology Nurse Navigator Flowsheets 03/25/2017  Navigator Location [facility]  Navigator Encounter Type Telephone/I followed up on Mr. Jeremy Deleon schedule. He needs CT chest before he sees Dr. Julien Nordmann on 9/11.  I called Mr. Jeremy Deleon to update him. I gave him the phone number to call and the reason to call. He was thankful for the call and will call central scheduling to schedule scan.   Telephone Outgoing Call  Treatment Phase Other  Barriers/Navigation Needs Coordination of Care;Education  Education Other  Interventions Coordination of Care;Education  Coordination of Care Other  Acuity Level 2  Time Spent with Patient 30

## 2017-03-26 ENCOUNTER — Ambulatory Visit: Payer: Medicare Other | Admitting: Endocrinology

## 2017-03-26 DIAGNOSIS — Z0289 Encounter for other administrative examinations: Secondary | ICD-10-CM

## 2017-03-27 ENCOUNTER — Encounter (HOSPITAL_COMMUNITY): Payer: Self-pay

## 2017-03-27 ENCOUNTER — Ambulatory Visit (HOSPITAL_COMMUNITY)
Admission: RE | Admit: 2017-03-27 | Discharge: 2017-03-27 | Disposition: A | Discharge status: Home / Self Care | Payer: Medicare Other | Source: Ambulatory Visit | Attending: Internal Medicine | Admitting: Internal Medicine

## 2017-03-27 DIAGNOSIS — I2510 Atherosclerotic heart disease of native coronary artery without angina pectoris: Secondary | ICD-10-CM | POA: Insufficient documentation

## 2017-03-27 DIAGNOSIS — J439 Emphysema, unspecified: Secondary | ICD-10-CM | POA: Diagnosis not present

## 2017-03-27 DIAGNOSIS — C3492 Malignant neoplasm of unspecified part of left bronchus or lung: Secondary | ICD-10-CM | POA: Insufficient documentation

## 2017-03-27 DIAGNOSIS — I700 Atherosclerosis of aorta: Secondary | ICD-10-CM | POA: Diagnosis not present

## 2017-03-27 MED ORDER — IOPAMIDOL (ISOVUE-300) INJECTION 61%
75.0000 mL | Freq: Once | INTRAVENOUS | Status: AC | PRN
  Administered 2017-03-27: 75 mL via INTRAVENOUS

## 2017-03-27 MED ORDER — IOPAMIDOL (ISOVUE-300) INJECTION 61%
INTRAVENOUS | Status: AC
  Filled 2017-03-27: qty 75

## 2017-03-31 ENCOUNTER — Telehealth: Payer: Self-pay | Admitting: Internal Medicine

## 2017-03-31 ENCOUNTER — Encounter: Payer: Self-pay | Admitting: Internal Medicine

## 2017-03-31 ENCOUNTER — Ambulatory Visit (HOSPITAL_BASED_OUTPATIENT_CLINIC_OR_DEPARTMENT_OTHER): Payer: Medicare Other | Admitting: Internal Medicine

## 2017-03-31 VITALS — BP 151/63 | HR 69 | Temp 97.6°F | Resp 18 | Ht 73.0 in | Wt 216.7 lb

## 2017-03-31 DIAGNOSIS — C3432 Malignant neoplasm of lower lobe, left bronchus or lung: Secondary | ICD-10-CM | POA: Diagnosis not present

## 2017-03-31 DIAGNOSIS — C3492 Malignant neoplasm of unspecified part of left bronchus or lung: Secondary | ICD-10-CM

## 2017-03-31 NOTE — Progress Notes (Signed)
[facility] [phone]   Fax:([phone]  OFFICE PROGRESS NOTE  [name], Shyrl Numbers, MD [place] [address]  DIAGNOSIS: Stage IIA (T2b, N0, M0) non-small cell lung cancer consistent with squamous cell carcinoma diagnosed in January 2016  PRIOR THERAPY:  1) Status post left VATS with left lower lobectomy and mediastinal lymph node dissection under the care of Dr. Prescott Gum on 08/10/2014. 2) Adjuvant systemic chemotherapy with carboplatin for AUC of 5 on day 1 and gemcitabine 1000 MG/M2 on days 1 and 8 every 3 weeks. First dose 09/19/2014.  CURRENT THERAPY: Observation.  INTERVAL HISTORY: Jeremy Deleon 83 y.o. male returns to the clinic today for six-month follow-up visit. The patient is feeling fine today with no specific complaints. He denied having any chest pain, shortness of breath, cough or hemoptysis. He denied having any weight loss or night sweats. He has no nausea, vomiting, diarrhea or constipation. The patient had repeat CT scan of the chest performed recently and he is here for evaluation and discussion of his scan results.  MEDICAL HISTORY: Past Medical History:  Diagnosis Date  . BPH (benign prostatic hyperplasia)   . CAD S/P percutaneous coronary angioplasty January 2006   Abnormal Myoview with inferolateral ischemia: --> 80% prox LAD lesion PCI: Taxus DES 3.0 mm x 16 mm-;; Myoview June 2009 no ischemia or infarction with attenuation artifact  . Dyslipidemia, goal LDL below 70    on simvastatin and tricor  . Full code status 04/23/2015  . GERD (gastroesophageal reflux disease)   . Hypertension   . Osteoarthritis   . Squamous cell lung cancer : Left lower lobe  08/03/2014   Bronchoscopy showed extrinsic compression of the left lower lobe with endobronchial biopsies were negative. He subsequently had a needle biopsy which diagnosed the mass as squamous cell carcinoma     ALLERGIES:  is allergic to  niaspan [niacin er].  MEDICATIONS:  Current Outpatient Prescriptions  Medication Sig Dispense Refill  . aspirin EC 325 MG tablet Take 325 mg by mouth daily.    Marland Kitchen atorvastatin (LIPITOR) 40 MG tablet Take 1 tablet (40 mg total) by mouth daily. 30 tablet 6  . fenofibrate (TRICOR) 145 MG tablet TAKE 1 TABLET(145 MG) BY MOUTH DAILY 90 tablet 1  . GENERLAC 10 GM/15ML SOLN TAKE 30ML BY MOUTH EVERY DAY 2838 mL 0  . isosorbide mononitrate (IMDUR) 30 MG 24 hr tablet Take 1 tablet (30 mg total) by mouth daily. 90 tablet 3  . methimazole (TAPAZOLE) 10 MG tablet TAKE 1 TABLET BY MOUTH TWICE DAILY 180 tablet 0  . traMADol (ULTRAM) 50 MG tablet Take 50 mg by mouth every 6 (six) hours as needed.     No current facility-administered medications for this visit.     SURGICAL HISTORY:  Past Surgical History:  Procedure Laterality Date  . APPENDECTOMY    . CARDIAC CATHETERIZATION  07/2004   80% proximal LAD lesion--PCI, EF 60%  . CAROTID DOPPLERS  09/2016   < 39% Plaque vertebral arteries  . CATARACT EXTRACTION    . NM MYOVIEW LTD  07/2004   inferolatera wall ischemia  . NM MYOVIEW LTD  January 06, 2008    treadmill  myoview - tissue attenuation but no ischemia or infarction  . PERCUTANEOUS CORONARY STENT INTERVENTION (PCI-S)  2/27/ 2006   TAXUS DES 3.0 mm x 16 mm stent ->  PROX  LAD  . renal doppler  05/03/2007  rgt and lft renal arteries normal patency; rgt and lft kidneys normal size  . TRANSTHORACIC ECHOCARDIOGRAM  08/2016   WITH BUBBLE STUDY: Normal LV function EF 60-65%. GR 1 DD. No PFO or ASD noted. Otherwise normal.  . TRANSTHORACIC ECHOCARDIOGRAM  08/2016   Normal LV size and function with EF of 65%. GR 1 DD. Negative bubble study. No regional wall motion abnormality.  . TRANSTHORACIC ECHOCARDIOGRAM  June 18 ,2009   mild concentric LVH, NORMAL ef, MILDLY  DILATED LEFT ATRIUM  AND MILD SCLEROTIC  AORTIC  VALVE AND OTHERWISE  NORMAL  . UMBILICAL HERNIA REPAIR    . VIDEO ASSISTED THORACOSCOPY  (VATS)/ LOBECTOMY Left 08/10/2014   Procedure: VIDEO ASSISTED THORACOSCOPY (VATS)/ LOBECTOMY;  Surgeon: Ivin Poot, MD;  Location: [facility];  Service: Thoracic;  Laterality: Left;  Marland Kitchen VIDEO BRONCHOSCOPY N/A 08/10/2014   Procedure: VIDEO BRONCHOSCOPY;  Surgeon: Ivin Poot, MD;  Location: [facility] OR;  Service: Thoracic;  Laterality: N/A;    REVIEW OF SYSTEMS:  A comprehensive review of systems was negative.   PHYSICAL EXAMINATION: General appearance: alert, cooperative and no distress Head: Normocephalic, without obvious abnormality, atraumatic Neck: no adenopathy, no JVD, supple, symmetrical, trachea midline and thyroid not enlarged, symmetric, no tenderness/mass/nodules Lymph nodes: Cervical, supraclavicular, and axillary nodes normal. Resp: clear to auscultation bilaterally Back: symmetric, no curvature. ROM normal. No CVA tenderness. Cardio: normal apical impulse GI: soft, non-tender; bowel sounds normal; no masses,  no organomegaly Extremities: extremities normal, atraumatic, no cyanosis or edema  ECOG PERFORMANCE STATUS: 1 - Symptomatic but completely ambulatory  Blood pressure (!) 151/63, pulse 69, temperature 97.6 F (36.4 C), temperature source Oral, resp. rate 18, height 6\' 1"  (1.854 m), weight 216 lb 11.2 oz (98.3 kg), SpO2 99 %.  LABORATORY DATA: Lab Results  Component Value Date   WBC 6.9 03/24/2017   HGB 15.3 03/24/2017   HCT 44.9 03/24/2017   MCV 92.3 03/24/2017   PLT 181 03/24/2017      Chemistry      Component Value Date/Time   NA 141 03/24/2017 1018   K 4.2 03/24/2017 1018   CL 99 (L) 10/07/2016 2156   CO2 25 03/24/2017 1018   BUN 16.5 03/24/2017 1018   CREATININE 1.2 03/24/2017 1018      Component Value Date/Time   CALCIUM 9.6 03/24/2017 1018   ALKPHOS 87 03/24/2017 1018   AST 30 03/24/2017 1018   ALT 17 03/24/2017 1018   BILITOT 0.56 03/24/2017 1018       RADIOGRAPHIC STUDIES: Ct Chest W Contrast  Result Date: 03/27/2017 CLINICAL DATA:  Squamous  cell carcinoma of the left lung. EXAM: CT CHEST WITH CONTRAST TECHNIQUE: Multidetector CT imaging of the chest was performed during intravenous contrast administration. CONTRAST:  61mL ISOVUE-300 IOPAMIDOL (ISOVUE-300) INJECTION 61% COMPARISON:  09/16/2016 FINDINGS: Cardiovascular: The heart size appears within normal limits. No pericardial effusion. Aortic atherosclerosis. Calcification in the LAD, RCA and left circumflex coronary artery noted. Mediastinum/Nodes: The trachea appears patent and is midline. Normal appearance of the esophagus. No enlarged mediastinal or hilar lymph nodes. No axillary or supraclavicular adenopathy. Lungs/Pleura: Status post left lower lobectomy. Mild changes of centrilobular emphysema. Small loculated left pleural effusion is unchanged from previous exam. Calcified granuloma within the right upper lobe is identified. Unchanged. No change in calcified granulomas in the right lower lobe. Subpleural nodularity overlying the posterior left lung measures 8 mm, image 101 of series 2. Unchanged from previous exam. Upper Abdomen: No acute abnormality.Stones layering within the dependent  portion of the gallbladder are identified. Musculoskeletal: There is degenerative disc disease identified within the thoracic spine. No aggressive lytic or sclerotic bone lesions identified. IMPRESSION: 1. Postoperative changes from left lower lobectomy. No specific findings identified to suggest residual or recurrence of tumor. 2. Aortic Atherosclerosis (ICD10-I70.0) and Emphysema (ICD10-J43.9). Three vessel coronary artery calcification noted. 3. Prior granulomatous disease Electronically Signed   By: Kerby Moors M.D.   On: 03/27/2017 09:32    ASSESSMENT AND PLAN:  This is a very pleasant 83 years old white male with history of stage IIA non-small cell lung cancer, squamous cell carcinoma status post left lower lobectomy with lymph node dissection followed by adjuvant systemic chemotherapy with  carboplatin and gemcitabine for 4 cycles. The patient has been observation since June 2016. He continues to do well with no specific complaints. He had repeat CT scan of the chest performed recently. His scan showed no evidence for disease recurrence. I discussed the scan results with the patient and recommended for him to continue on observation with repeat CT scan of the chest in 6 months. The patient was advised to call immediately if he has any concerning symptoms in the interval. The patient voices understanding of current disease status and treatment options and is in agreement with the current care plan. All questions were answered. The patient knows to call the clinic with any problems, questions or concerns. We can certainly see the patient much sooner if necessary. I spent 10 minutes counseling the patient face to face. The total time spent in the appointment was 15 minutes.  Disclaimer: This note was dictated with voice recognition software. Similar sounding words can inadvertently be transcribed and may not be corrected upon review.

## 2017-03-31 NOTE — Telephone Encounter (Signed)
Gave patient AVs and calendar of upcoming March appointment.

## 2017-04-02 ENCOUNTER — Other Ambulatory Visit: Payer: Self-pay | Admitting: Endocrinology

## 2017-04-26 ENCOUNTER — Other Ambulatory Visit: Payer: Self-pay | Admitting: Endocrinology

## 2017-04-26 NOTE — Telephone Encounter (Signed)
Please refill x 1 Ov is due  

## 2017-04-27 ENCOUNTER — Other Ambulatory Visit: Payer: Self-pay

## 2017-04-27 MED ORDER — METHIMAZOLE 10 MG PO TABS: 10.0000 mg | ORAL_TABLET | Freq: Two times a day (BID) | ORAL | 1 refills | Status: DC

## 2017-06-12 ENCOUNTER — Emergency Department (HOSPITAL_BASED_OUTPATIENT_CLINIC_OR_DEPARTMENT_OTHER): Payer: Medicare Other

## 2017-06-12 ENCOUNTER — Encounter (HOSPITAL_BASED_OUTPATIENT_CLINIC_OR_DEPARTMENT_OTHER): Payer: Self-pay | Admitting: Adult Health

## 2017-06-12 ENCOUNTER — Other Ambulatory Visit: Payer: Self-pay

## 2017-06-12 ENCOUNTER — Emergency Department (HOSPITAL_BASED_OUTPATIENT_CLINIC_OR_DEPARTMENT_OTHER)
Admission: EM | Admit: 2017-06-12 | Discharge: 2017-06-12 | Disposition: A | Discharge status: Home / Self Care | Payer: Medicare Other | Attending: Emergency Medicine | Admitting: Emergency Medicine

## 2017-06-12 DIAGNOSIS — I10 Essential (primary) hypertension: Secondary | ICD-10-CM | POA: Insufficient documentation

## 2017-06-12 DIAGNOSIS — Y92007 Garden or yard of unspecified non-institutional (private) residence as the place of occurrence of the external cause: Secondary | ICD-10-CM | POA: Insufficient documentation

## 2017-06-12 DIAGNOSIS — Z87891 Personal history of nicotine dependence: Secondary | ICD-10-CM | POA: Diagnosis not present

## 2017-06-12 DIAGNOSIS — S8991XA Unspecified injury of right lower leg, initial encounter: Secondary | ICD-10-CM | POA: Diagnosis present

## 2017-06-12 DIAGNOSIS — Z23 Encounter for immunization: Secondary | ICD-10-CM | POA: Diagnosis not present

## 2017-06-12 DIAGNOSIS — Z85118 Personal history of other malignant neoplasm of bronchus and lung: Secondary | ICD-10-CM | POA: Diagnosis not present

## 2017-06-12 DIAGNOSIS — Y998 Other external cause status: Secondary | ICD-10-CM | POA: Diagnosis not present

## 2017-06-12 DIAGNOSIS — W010XXA Fall on same level from slipping, tripping and stumbling without subsequent striking against object, initial encounter: Secondary | ICD-10-CM | POA: Diagnosis not present

## 2017-06-12 DIAGNOSIS — Z79899 Other long term (current) drug therapy: Secondary | ICD-10-CM | POA: Diagnosis not present

## 2017-06-12 DIAGNOSIS — S8001XA Contusion of right knee, initial encounter: Secondary | ICD-10-CM | POA: Insufficient documentation

## 2017-06-12 DIAGNOSIS — Z7982 Long term (current) use of aspirin: Secondary | ICD-10-CM | POA: Diagnosis not present

## 2017-06-12 DIAGNOSIS — I2510 Atherosclerotic heart disease of native coronary artery without angina pectoris: Secondary | ICD-10-CM | POA: Insufficient documentation

## 2017-06-12 DIAGNOSIS — Y9389 Activity, other specified: Secondary | ICD-10-CM | POA: Insufficient documentation

## 2017-06-12 MED ORDER — TETANUS-DIPHTH-ACELL PERTUSSIS 5-2.5-18.5 LF-MCG/0.5 IM SUSP
0.5000 mL | Freq: Once | INTRAMUSCULAR | Status: AC
  Administered 2017-06-12: 0.5 mL via INTRAMUSCULAR
  Filled 2017-06-12: qty 0.5

## 2017-06-12 NOTE — ED Triage Notes (Signed)
PResents post fall onto ground from standing and landed on right knee. Right knee deformity, swelling and bruising. Denies other injury. CMS intact. Pt is able to ambulate. He reports that he went to pick up the newspaper and got his leg twisted in "something" and fell.

## 2017-06-12 NOTE — ED Provider Notes (Signed)
[facility] EMERGENCY DEPARTMENT Provider Note   CSN: [id number] Arrival date & time: 06/12/17  6295     History   Chief Complaint Chief Complaint  Patient presents with  . Knee Injury    HPI Jeremy Deleon is a 83 y.o. male.  Patient is a 83 year old male with a history of hypertension, coronary artery disease and osteoarthritis who presents with knee pain after a fall.  He states he was going out to get his newspaper and tripped on a fine that was growing on the ground.  He fell down onto his right knee.  This happened this morning and he has had constant throbbing and swelling to the knee since that time.  He is able to bear weight on it.  He also has a small abrasion to his right arm but denies any bony injury to the area.  He denies hitting his head.  There is no loss of consciousness.  He denies any other injuries from the fall.  He is on aspirin but no other anticoagulants.      Past Medical History:  Diagnosis Date  . BPH (benign prostatic hyperplasia)   . CAD S/P percutaneous coronary angioplasty January 2006   Abnormal Myoview with inferolateral ischemia: --> 80% prox LAD lesion PCI: Taxus DES 3.0 mm x 16 mm-;; Myoview June 2009 no ischemia or infarction with attenuation artifact  . Dyslipidemia, goal LDL below 70    on simvastatin and tricor  . Full code status 04/23/2015  . GERD (gastroesophageal reflux disease)   . Hypertension   . Osteoarthritis   . Squamous cell lung cancer : Left lower lobe  08/03/2014   Bronchoscopy showed extrinsic compression of the left lower lobe with endobronchial biopsies were negative. He subsequently had a needle biopsy which diagnosed the mass as squamous cell carcinoma     Patient Active Problem List   Diagnosis Date Noted  . Retinal artery plaque 09/01/2016  . Hyperthyroidism 07/26/2015  . Full code status 04/23/2015  . Orthostatic hypotension; with baseline hypotension 11/13/2014  . Cellulitis 10/17/2014  .  Chemotherapy induced neutropenia ([place]) 10/17/2014  . S/P lobectomy of lung 08/10/2014  . Squamous cell lung cancer : Left lower lobe  08/03/2014  . Dyslipidemia, goal LDL below 70 10/25/2013  . CAD S/P percutaneous coronary angioplasty -- Taxus DES in Prox LAD 10/25/2013  . Essential hypertension     Past Surgical History:  Procedure Laterality Date  . APPENDECTOMY    . CARDIAC CATHETERIZATION  07/2004   80% proximal LAD lesion--PCI, EF 60%  . CAROTID DOPPLERS  09/2016   < 39% Plaque vertebral arteries  . CATARACT EXTRACTION    . NM MYOVIEW LTD  07/2004   inferolatera wall ischemia  . NM MYOVIEW LTD  January 06, 2008    treadmill  myoview - tissue attenuation but no ischemia or infarction  . PERCUTANEOUS CORONARY STENT INTERVENTION (PCI-S)  2/27/ 2006   TAXUS DES 3.0 mm x 16 mm stent ->  PROX  LAD  . renal doppler  05/03/2007   rgt and lft renal arteries normal patency; rgt and lft kidneys normal size  . TRANSTHORACIC ECHOCARDIOGRAM  08/2016   WITH BUBBLE STUDY: Normal LV function EF 60-65%. GR 1 DD. No PFO or ASD noted. Otherwise normal.  . TRANSTHORACIC ECHOCARDIOGRAM  08/2016   Normal LV size and function with EF of 65%. GR 1 DD. Negative bubble study. No regional wall motion abnormality.  . TRANSTHORACIC ECHOCARDIOGRAM  June 18 ,  2009   mild concentric LVH, NORMAL ef, MILDLY  DILATED LEFT ATRIUM  AND MILD SCLEROTIC  AORTIC  VALVE AND OTHERWISE  NORMAL  . UMBILICAL HERNIA REPAIR    . VIDEO ASSISTED THORACOSCOPY (VATS)/ LOBECTOMY Left 08/10/2014   Procedure: VIDEO ASSISTED THORACOSCOPY (VATS)/ LOBECTOMY;  Surgeon: Ivin Poot, MD;  Location: [facility];  Service: Thoracic;  Laterality: Left;  Marland Kitchen VIDEO BRONCHOSCOPY N/A 08/10/2014   Procedure: VIDEO BRONCHOSCOPY;  Surgeon: Ivin Poot, MD;  Location: [facility] OR;  Service: Thoracic;  Laterality: N/A;       Home Medications    Prior to Admission medications   Medication Sig Start Date End Date Taking? Authorizing Provider  aspirin EC  325 MG tablet Take 325 mg by mouth daily.    [provider]  atorvastatin (LIPITOR) 40 MG tablet Take 1 tablet (40 mg total) by mouth daily. 10/08/16 03/16/17  Leonie Man, MD  fenofibrate (TRICOR) 145 MG tablet TAKE 1 TABLET(145 MG) BY MOUTH DAILY 11/26/16   Leonie Man, MD  [facility] 10 GM/15ML SOLN TAKE 30ML BY MOUTH EVERY DAY 07/04/16   Irene Shipper, MD  isosorbide mononitrate (IMDUR) 30 MG 24 hr tablet Take 1 tablet (30 mg total) by mouth daily. 03/16/17   Leonie Man, MD  methimazole (TAPAZOLE) 10 MG tablet Take 1 tablet (10 mg total) by mouth 2 (two) times daily. 04/27/17   Renato Shin, MD  traMADol (ULTRAM) 50 MG tablet Take 50 mg by mouth every 6 (six) hours as needed. 12/25/15   [provider]    Family History Family History  Problem Relation Age of Onset  . Cancer Mother   . Heart disease Mother   . Heart disease Father   . Parkinsonism Brother   . Thyroid disease Neg Hx     Social History Social History   Tobacco Use  . Smoking status: Former Smoker    Types: Cigarettes    Last attempt to quit: 10/25/1982    Years since quitting: 34.6  . Smokeless tobacco: Never Used  Substance Use Topics  . Alcohol use: No  . Drug use: No     Allergies   Niaspan [niacin er]   Review of Systems Review of Systems  Constitutional: Negative for fever.  Gastrointestinal: Negative for nausea and vomiting.  Musculoskeletal: Positive for arthralgias and joint swelling. Negative for back pain and neck pain.  Skin: Negative for wound.  Neurological: Negative for weakness, numbness and headaches.     Physical Exam Updated Vital Signs BP (!) 152/78 (BP Location: Right Arm)   Pulse 69   Temp 97.8 F (36.6 C) (Oral)   Resp 18   SpO2 98%   Physical Exam  Constitutional: He is oriented to person, place, and time. He appears well-developed and well-nourished.  HENT:  Head: Normocephalic and atraumatic.  Neck: Normal range of motion. Neck supple.    Cardiovascular: Normal rate.  Pulmonary/Chest: Effort normal.  Musculoskeletal: He exhibits edema and tenderness.  Patient has ecchymosis and swelling over the anterior portion of the right knee.  I do not appreciate any joint effusion, the swelling appears to be more superficial.  There is tenderness to the anterior portion of the knee along the patella.  There is no pain to the hip or ankle.  He has normal sensation and motor function distally.  Pedal pulses are intact.  There is a small abrasion to his right upper arm.  There is no underlying bony tenderness.  No  other pain on palpation or range of motion of the extremities.  Neurological: He is alert and oriented to person, place, and time.  Skin: Skin is warm and dry.  Psychiatric: He has a normal mood and affect.     ED Treatments / Results  Labs (all labs ordered are listed, but only abnormal results are displayed) Labs Reviewed - No data to display  EKG  EKG Interpretation None       Radiology Dg Knee Complete 4 Views Right  Result Date: 06/12/2017 CLINICAL DATA:  Pt fell outside twisting, hitting and landing on right knee, pt c/o right knee pain, swelling and bruising since falling EXAM: RIGHT KNEE - COMPLETE 4+ VIEW COMPARISON:  None. FINDINGS: No osseous fracture line or displaced fracture fragment seen. No acute appearing cortical irregularity or osseous lesion. No appreciable joint effusion. Degenerative narrowing of the medial compartment with associated mild osseous spurring. Lateral and patellofemoral compartments appear relatively well preserved. Prominent soft tissue swelling/ hematoma overlying the medial joint line. Lateral and suprapatellar soft tissues are unremarkable. IMPRESSION: 1. No osseous fracture or dislocation seen. 2. Prominent soft tissue swelling/hematoma overlying the medial joint line. No evidence of underlying joint effusion seen. 3. Degenerative narrowing of the medial knee compartment, mild to  moderate in degree, with associated mild osseous spurring. Electronically Signed   By: Franki Cabot M.D.   On: 06/12/2017 10:40    Procedures Procedures (including critical care time)  Medications Ordered in ED Medications  Tdap (BOOSTRIX) injection 0.5 mL (0.5 mLs Intramuscular Given 06/12/17 1048)     Initial Impression / Assessment and Plan / ED Course  I have reviewed the triage vital signs and the nursing notes.  Pertinent labs & imaging results that were available during my care of the patient were reviewed by me and considered in my medical decision making (see chart for details).     Patient is a 83 year old male who presents with right knee pain after a fall.  This was a mechanical fall.  There is no underlying fracture that is visualized.  No clinical evidence of ligamentous injury.  No joint effusion.  It appears to be a large hematoma to his anterior knee.  An Ace wrap was placed.  He was advised in symptomatic care.  He was advised to follow-up with his PCP if his symptoms are not improving.  Final Clinical Impressions(s) / ED Diagnoses   Final diagnoses:  Contusion of right knee, initial encounter    ED Discharge Orders    None       Malvin Johns, MD 06/12/17 1100

## 2017-08-19 ENCOUNTER — Other Ambulatory Visit: Payer: Self-pay | Admitting: Dermatology

## 2017-09-04 ENCOUNTER — Telehealth: Payer: Self-pay | Admitting: Cardiology

## 2017-09-04 DIAGNOSIS — E7800 Pure hypercholesterolemia, unspecified: Secondary | ICD-10-CM

## 2017-09-04 NOTE — Telephone Encounter (Addendum)
Spoke with pt, he needs a lipid and liver test now according to the labs from august. Lab orders mailed to the pt

## 2017-09-04 NOTE — Telephone Encounter (Signed)
Mr.Engen is calling to find out if he will need lab work before his visit on 09/14/17. Please call

## 2017-09-08 LAB — LIPID PANEL
CHOLESTEROL TOTAL: 164 mg/dL (ref 100–199)
Chol/HDL Ratio: 4.2 ratio (ref 0.0–5.0)
HDL: 39 mg/dL — AB (ref >39)
LDL Calculated: 99 mg/dL (ref 0–99)
TRIGLYCERIDES: 132 mg/dL (ref 0–149)
VLDL Cholesterol Cal: 26 mg/dL (ref 5–40)

## 2017-09-08 LAB — HEPATIC FUNCTION PANEL
ALT: 25 IU/L (ref 0–44)
AST: 32 IU/L (ref 0–40)
Albumin: 4.3 g/dL (ref 3.5–4.7)
Alkaline Phosphatase: 123 IU/L — ABNORMAL HIGH (ref 39–117)
BILIRUBIN TOTAL: 0.6 mg/dL (ref 0.0–1.2)
BILIRUBIN, DIRECT: 0.17 mg/dL (ref 0.00–0.40)
Total Protein: 6.8 g/dL (ref 6.0–8.5)

## 2017-09-14 ENCOUNTER — Encounter: Payer: Self-pay | Admitting: Cardiology

## 2017-09-14 ENCOUNTER — Ambulatory Visit: Payer: Medicare Other | Admitting: Cardiology

## 2017-09-14 VITALS — BP 132/78 | HR 79 | Ht 73.0 in | Wt 213.0 lb

## 2017-09-14 DIAGNOSIS — E785 Hyperlipidemia, unspecified: Secondary | ICD-10-CM | POA: Diagnosis not present

## 2017-09-14 DIAGNOSIS — I2510 Atherosclerotic heart disease of native coronary artery without angina pectoris: Secondary | ICD-10-CM

## 2017-09-14 DIAGNOSIS — I951 Orthostatic hypotension: Secondary | ICD-10-CM | POA: Diagnosis not present

## 2017-09-14 DIAGNOSIS — Z9861 Coronary angioplasty status: Secondary | ICD-10-CM | POA: Diagnosis not present

## 2017-09-14 DIAGNOSIS — M7989 Other specified soft tissue disorders: Secondary | ICD-10-CM | POA: Diagnosis not present

## 2017-09-14 MED ORDER — ATORVASTATIN CALCIUM 40 MG PO TABS: 40.0000 mg | ORAL_TABLET | Freq: Every day | ORAL | 3 refills | Status: DC

## 2017-09-14 NOTE — Progress Notes (Signed)
PCP: [name], MD  Clinic Note: No chief complaint on file.   HPI: Jeremy Deleon is a 84 y.o. male with a PMH below who presents today for four-month follow-up for coronary disease in with dizziness. He also has a diagnosis of left lung squamous cell carcinoma - s/p VATS (Dr. Prescott Gum 08/10/2014) with LL Lobectomy & Mediastinal LN dissection.  He had a fall back in April 2018.  Had a an echo with bubble to evaluate possible TIA  Jeremy Deleon was last seen In August 2018, at this time relatively asymptomatic from a cardiac standpoint.  Recommend a statin holiday.  Was converted from [place] to losartan, and start beta-blocker due to orthostatic hypotension   Recent Hospitalizations:   Suffered a fall with right knee contusion June 12, 2017 (tripped over a plant)  Studies Personally Reviewed - (if available, images/films reviewed: From Epic Chart or Care Everywhere)  No new studies  Interval History: Dianne presents today overall feeling pretty well.  He only notes occasional exertional dyspnea if he overdoes it.  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  Otherwise he is doing relatively well with no real true edema.  No PND or orthopnea. He denies any anginal symptoms with rest or exertion (to the extent of exertion he does).  No rapid irregular heartbeats or palpitations.  No syncope/near syncope or TIA/amaurosis fugax. No claudication  ROS: A comprehensive was performed. Pertinent symptoms noted above. Review of Systems  Constitutional: Positive for malaise/fatigue.  HENT: Negative for nosebleeds.   Respiratory: Positive for shortness of breath (If he overdoes it).   Cardiovascular: Negative for chest pain and palpitations.  Gastrointestinal: Positive for abdominal pain and constipation. Negative for blood in stool and melena.  Genitourinary: Negative for  hematuria.  Musculoskeletal: Positive for falls (In addition to the fall where he hurt his knee, just this past week or so he missed a step climbing up onto his porch.  And fell backwards on the grass.).  Psychiatric/Behavioral: Negative for memory loss. The patient is not nervous/anxious and does not have insomnia.        Difficult to determine if he is describing symptoms as it dysthymia.  All other systems reviewed and are negative.  I have reviewed and (if needed) personally updated the patient's problem list, medications, allergies, past medical and surgical history, social and family history.   Past Medical History:  Diagnosis Date  . BPH (benign prostatic hyperplasia)   . CAD S/P percutaneous coronary angioplasty January 2006   Abnormal Myoview with inferolateral ischemia: --> 80% prox LAD lesion PCI: Taxus DES 3.0 mm x 16 mm-;; Myoview June 2009 no ischemia or infarction with attenuation artifact  . Dyslipidemia, goal LDL below 70    on simvastatin and tricor  . Full code status 04/23/2015  . GERD (gastroesophageal reflux disease)   . Hypertension   . Osteoarthritis   . Squamous cell lung cancer : Left lower lobe  08/03/2014   Bronchoscopy showed extrinsic compression of the left lower lobe with endobronchial biopsies were negative. He subsequently had a needle biopsy which diagnosed the mass as squamous cell carcinoma     Past Surgical History:  Procedure Laterality Date  . APPENDECTOMY    . CARDIAC CATHETERIZATION  07/2004   80% proximal LAD lesion--PCI, EF 60%  . CAROTID DOPPLERS  09/2016   <  39% Plaque vertebral arteries  . CATARACT EXTRACTION    . NM MYOVIEW LTD  07/2004   inferolatera wall ischemia  . NM MYOVIEW LTD  January 06, 2008    treadmill  myoview - tissue attenuation but no ischemia or infarction  . PERCUTANEOUS CORONARY STENT INTERVENTION (PCI-S)  2/27/ 2006   TAXUS DES 3.0 mm x 16 mm stent ->  PROX  LAD  . renal doppler  05/03/2007   rgt and lft renal arteries  normal patency; rgt and lft kidneys normal size  . TRANSTHORACIC ECHOCARDIOGRAM  08/2016   WITH BUBBLE STUDY: Normal LV function EF 60-65%. GR 1 DD. No PFO or ASD noted. Otherwise normal.  . TRANSTHORACIC ECHOCARDIOGRAM  08/2016   Normal LV size and function with EF of 65%. GR 1 DD. Negative bubble study. No regional wall motion abnormality.  . TRANSTHORACIC ECHOCARDIOGRAM  June 18 ,2009   mild concentric LVH, NORMAL ef, MILDLY  DILATED LEFT ATRIUM  AND MILD SCLEROTIC  AORTIC  VALVE AND OTHERWISE  NORMAL  . UMBILICAL HERNIA REPAIR    . VIDEO ASSISTED THORACOSCOPY (VATS)/ LOBECTOMY Left 08/10/2014   Procedure: VIDEO ASSISTED THORACOSCOPY (VATS)/ LOBECTOMY;  Surgeon: Ivin Poot, MD;  Location: [facility];  Service: Thoracic;  Laterality: Left;  Marland Kitchen VIDEO BRONCHOSCOPY N/A 08/10/2014   Procedure: VIDEO BRONCHOSCOPY;  Surgeon: Ivin Poot, MD;  Location: [facility] OR;  Service: Thoracic;  Laterality: N/A;    Current Meds  Medication Sig  . aspirin EC 325 MG tablet Take 325 mg by mouth daily.  . fenofibrate (TRICOR) 145 MG tablet TAKE 1 TABLET(145 MG) BY MOUTH DAILY  . GENERLAC 10 GM/15ML SOLN TAKE 30ML BY MOUTH EVERY DAY  . isosorbide mononitrate (IMDUR) 30 MG 24 hr tablet Take 1 tablet (30 mg total) by mouth daily.  . methimazole (TAPAZOLE) 10 MG tablet Take 1 tablet (10 mg total) by mouth 2 (two) times daily.  . traMADol (ULTRAM) 50 MG tablet Take 50 mg by mouth every 6 (six) hours as needed.    Allergies  Allergen Reactions  . Niaspan [Niacin Er] Other (See Comments)    flushing    Social History   Socioeconomic History  . Marital status: Single    Spouse name: None  . Number of children: None  . Years of education: None  . Highest education level: None  Social Needs  . Financial resource strain: None  . Food insecurity - worry: None  . Food insecurity - inability: None  . Transportation needs - medical: None  . Transportation needs - non-medical: None  Occupational History  .  None  Tobacco Use  . Smoking status: Former Smoker    Types: Cigarettes    Last attempt to quit: 10/25/1982    Years since quitting: 34.9  . Smokeless tobacco: Never Used  Substance and Sexual Activity  . Alcohol use: No  . Drug use: No  . Sexual activity: None  Other Topics Concern  . None  Social History Narrative    He is a widowed father of 34, grandfather of 61, still working. Does not smoke, quit 30   years ago and does not drink alcohol. He still works in his Nurse, learning disability for [employer].    family history includes Cancer in his mother; Heart disease in his father and mother; Parkinsonism in his brother.  Wt Readings from Last 3 Encounters:  09/14/17 213 lb (96.6 kg)  03/31/17 216 lb 11.2 oz (98.3 kg)  03/16/17 216  lb (98 kg)    PHYSICAL EXAM BP 132/78   Pulse 79   Ht 6\' 1"  (1.854 m)   Wt 213 lb (96.6 kg)   BMI 28.10 kg/m  Physical Exam  Constitutional: He is oriented to person, place, and time. He appears well-developed and well-nourished. No distress.  Well-groomed. Pleasant mood and affect  HENT:  Head: Normocephalic and atraumatic.  Wears glasses  Eyes: EOM are normal. Pupils are equal, round, and reactive to light.  Neck: Normal range of motion. Neck supple. No hepatojugular reflux and no JVD present. Carotid bruit is not present. No thyromegaly present.  Cardiovascular: Normal rate, regular rhythm and intact distal pulses. Exam reveals no gallop and no friction rub.  No murmur heard. Pulmonary/Chest: Effort normal. No respiratory distress. He has no wheezes. He has no rales.  Abdominal: Soft. Bowel sounds are normal. He exhibits no distension. There is no tenderness. There is no rebound.  Musculoskeletal: Normal range of motion. He exhibits no edema.  Neurological: He is alert and oriented to person, place, and time.  Skin: Skin is warm and dry.  Psychiatric: He has a normal mood and affect.    Adult ECG Report n/a  Other studies  Reviewed: Additional studies/ records that were reviewed today include:  Recent Labs:  n/a  Lab Results  Component Value Date   CHOL 164 09/08/2017   HDL 39 (L) 09/08/2017   LDLCALC 99 09/08/2017   TRIG 132 09/08/2017   CHOLHDL 4.2 09/08/2017   Lab Results  Component Value Date   CREATININE 1.2 03/24/2017   BUN 16.5 03/24/2017   NA 141 03/24/2017   K 4.2 03/24/2017   CL 99 (L) 10/07/2016   CO2 25 03/24/2017     ASSESSMENT / PLAN:  Overall doing relatively well with no major complaints.   Restarting statin, recommend support stockings. Recheck lipids prior to 23-month follow-up per patient request  Problem List Items Addressed This Visit    CAD S/P percutaneous coronary angioplasty -- Taxus DES in Prox LAD - Primary (Chronic)    Is relatively stable with no active angina symptoms.  No longer on beta-blocker because of fatigue.  Remains on low-dose Imdur. Normal EF heart failure symptoms. Aspirin without Plavix. Back on statin      Relevant Medications   atorvastatin (LIPITOR) 40 MG tablet   Other Relevant Orders   Comprehensive metabolic panel   Dyslipidemia, goal LDL below 70 (Chronic)    His lipids show effect of having held his statin for a month.  He will restart statin. -Recheck lipids in 6 months.      Relevant Medications   atorvastatin (LIPITOR) 40 MG tablet   Other Relevant Orders   Lipid panel   Comprehensive metabolic panel   Orthostatic hypotension; with baseline hypotension (Chronic)    Blood pressure is actually pretty good here today.  He has minimal orthostatic dizziness now off of antihypertensives.      Relevant Medications   atorvastatin (LIPITOR) 40 MG tablet   Swelling of lower leg    He is noting some lower extremity edema we talked about using support stockings to avoid the need for diuretic.         Current medicines are reviewed at length with the patient today. (+/- concerns) still notes fatigue, has stopped BB. The following  changes have been made: see below.  Patient Instructions  No medication changes   Continue ATORVASTATIN    LABS  CMP LIPID IN AUG 2019- WILL MAIL  LABSLIP TO YOU IN July 2019.   PURCHASE SUPPORT/COMPRESSION STOCKING - START OFF 8-10 mmhg, may increase to 15-20 mmhg. Try to wear them daily.   Your physician wants you to follow-up in [place].You will receive a reminder letter in the mail two months in advance. If you don't receive a letter, please call our office to schedule the follow-up appointment.   If you need a refill on your cardiac medications before your next appointment, please call your pharmacy.    Studies Ordered:   Orders Placed This Encounter  Procedures  . Lipid panel  . Comprehensive metabolic panel      Glenetta Hew, M.D., M.S. Interventional Cardiologist   Pager # [phone] Phone # [phone] [address] [place], [place] [address]

## 2017-09-14 NOTE — Patient Instructions (Addendum)
No medication changes   Continue ATORVASTATIN    LABS  CMP LIPID IN AUG 2019- WILL MAIL LABSLIP TO YOU IN July 2019.   PURCHASE SUPPORT/COMPRESSION STOCKING - START OFF 8-10 mmhg, may increase to 15-20 mmhg. Try to wear them daily.   Your physician wants you to follow-up in [place].You will receive a reminder letter in the mail two months in advance. If you don't receive a letter, please call our office to schedule the follow-up appointment.   If you need a refill on your cardiac medications before your next appointment, please call your pharmacy.

## 2017-09-15 NOTE — Assessment & Plan Note (Signed)
Is relatively stable with no active angina symptoms.  No longer on beta-blocker because of fatigue.  Remains on low-dose Imdur. Normal EF heart failure symptoms. Aspirin without Plavix. Back on statin

## 2017-09-15 NOTE — Assessment & Plan Note (Signed)
His lipids show effect of having held his statin for a month.  He will restart statin. -Recheck lipids in 6 months.

## 2017-09-15 NOTE — Assessment & Plan Note (Signed)
Blood pressure is actually pretty good here today.  He has minimal orthostatic dizziness now off of antihypertensives.

## 2017-09-15 NOTE — Assessment & Plan Note (Signed)
He is noting some lower extremity edema we talked about using support stockings to avoid the need for diuretic.

## 2017-09-28 ENCOUNTER — Ambulatory Visit (HOSPITAL_COMMUNITY): Admission: RE | Admit: 2017-09-28 | Payer: Medicare Other | Source: Ambulatory Visit

## 2017-09-28 ENCOUNTER — Other Ambulatory Visit: Payer: Medicare Other

## 2017-10-01 ENCOUNTER — Ambulatory Visit (HOSPITAL_COMMUNITY)
Admission: RE | Admit: 2017-10-01 | Discharge: 2017-10-01 | Disposition: A | Discharge status: Home / Self Care | Payer: Medicare Other | Source: Ambulatory Visit | Attending: Internal Medicine | Admitting: Internal Medicine

## 2017-10-01 ENCOUNTER — Other Ambulatory Visit: Payer: Medicare Other

## 2017-10-01 ENCOUNTER — Inpatient Hospital Stay: Payer: Medicare Other | Attending: Internal Medicine

## 2017-10-01 DIAGNOSIS — I700 Atherosclerosis of aorta: Secondary | ICD-10-CM | POA: Insufficient documentation

## 2017-10-01 DIAGNOSIS — J439 Emphysema, unspecified: Secondary | ICD-10-CM | POA: Insufficient documentation

## 2017-10-01 DIAGNOSIS — C3492 Malignant neoplasm of unspecified part of left bronchus or lung: Secondary | ICD-10-CM

## 2017-10-01 DIAGNOSIS — I2510 Atherosclerotic heart disease of native coronary artery without angina pectoris: Secondary | ICD-10-CM | POA: Diagnosis not present

## 2017-10-01 LAB — COMPREHENSIVE METABOLIC PANEL
ALBUMIN: 3.6 g/dL (ref 3.5–5.0)
ALT: 16 U/L (ref 0–55)
ANION GAP: 5 (ref 3–11)
AST: 26 U/L (ref 5–34)
Alkaline Phosphatase: 117 U/L (ref 40–150)
BUN: 18 mg/dL (ref 7–26)
CALCIUM: 9.3 mg/dL (ref 8.4–10.4)
CHLORIDE: 104 mmol/L (ref 98–109)
CO2: 30 mmol/L — AB (ref 22–29)
Creatinine, Ser: 1.26 mg/dL (ref 0.70–1.30)
GFR calc Af Amer: 60 mL/min — ABNORMAL LOW (ref >60)
GFR calc non Af Amer: 51 mL/min — ABNORMAL LOW (ref >60)
GLUCOSE: 93 mg/dL (ref 70–140)
Potassium: 4.8 mmol/L (ref 3.5–5.1)
SODIUM: 139 mmol/L (ref 136–145)
Total Bilirubin: 0.8 mg/dL (ref 0.2–1.2)
Total Protein: 6.5 g/dL (ref 6.4–8.3)

## 2017-10-01 LAB — CBC WITH DIFFERENTIAL/PLATELET
BASOS PCT: 1 %
Basophils Absolute: 0.1 10*3/uL (ref 0.0–0.1)
EOS ABS: 0.4 10*3/uL (ref 0.0–0.5)
Eosinophils Relative: 6 %
HCT: 42.5 % (ref 38.4–49.9)
Hemoglobin: 14.1 g/dL (ref 13.0–17.1)
Lymphocytes Relative: 28 %
Lymphs Abs: 2 10*3/uL (ref 0.9–3.3)
MCH: 30.9 pg (ref 27.2–33.4)
MCHC: 33.1 g/dL (ref 32.0–36.0)
MCV: 93.4 fL (ref 79.3–98.0)
MONOS PCT: 7 %
Monocytes Absolute: 0.5 10*3/uL (ref 0.1–0.9)
Neutro Abs: 4.2 10*3/uL (ref 1.5–6.5)
Neutrophils Relative %: 58 %
Platelets: 160 10*3/uL (ref 140–400)
RBC: 4.55 MIL/uL (ref 4.20–5.82)
RDW: 14.4 % (ref 11.0–14.6)
WBC: 7.2 10*3/uL (ref 4.0–10.3)

## 2017-10-01 MED ORDER — IOPAMIDOL (ISOVUE-300) INJECTION 61%
INTRAVENOUS | Status: AC
  Administered 2017-10-01: 100 mL
  Filled 2017-10-01: qty 75

## 2017-10-05 ENCOUNTER — Ambulatory Visit: Payer: Medicare Other | Admitting: Internal Medicine

## 2017-10-06 ENCOUNTER — Other Ambulatory Visit: Payer: Self-pay | Admitting: Cardiology

## 2017-10-06 NOTE — Telephone Encounter (Signed)
Rx has been sent to the pharmacy electronically. ° °

## 2017-10-20 ENCOUNTER — Telehealth: Payer: Self-pay | Admitting: Internal Medicine

## 2017-10-20 ENCOUNTER — Inpatient Hospital Stay: Payer: Medicare Other | Attending: Internal Medicine | Admitting: Internal Medicine

## 2017-10-20 ENCOUNTER — Encounter: Payer: Self-pay | Admitting: Internal Medicine

## 2017-10-20 DIAGNOSIS — Z9221 Personal history of antineoplastic chemotherapy: Secondary | ICD-10-CM

## 2017-10-20 DIAGNOSIS — C3490 Malignant neoplasm of unspecified part of unspecified bronchus or lung: Secondary | ICD-10-CM

## 2017-10-20 DIAGNOSIS — Z85828 Personal history of other malignant neoplasm of skin: Secondary | ICD-10-CM | POA: Diagnosis not present

## 2017-10-20 DIAGNOSIS — C3432 Malignant neoplasm of lower lobe, left bronchus or lung: Secondary | ICD-10-CM | POA: Insufficient documentation

## 2017-10-20 NOTE — Telephone Encounter (Signed)
Scheduled appt per 4/2 los - Gave patient AVS and calender per los. - Patient to get a call from [facility] to schedule ct.

## 2017-10-20 NOTE — Progress Notes (Signed)
[place] [phone]   [phone]  OFFICE PROGRESS NOTE  [name], Shyrl Numbers, MD [place] [address]  DIAGNOSIS: Stage IIA (T2b, N0, M0) non-small cell lung cancer consistent with squamous cell carcinoma diagnosed in January 2016  PRIOR THERAPY:  1) Status post left VATS with left lower lobectomy and mediastinal lymph node dissection under the care of Dr. Prescott Gum on 08/10/2014. 2) Adjuvant systemic chemotherapy with carboplatin for AUC of 5 on day 1 and gemcitabine 1000 MG/M2 on days 1 and 8 every 3 weeks. First dose 09/19/2014.  CURRENT THERAPY: Observation.  INTERVAL HISTORY: Jeremy Deleon 84 y.o. male returns to the clinic today for 6 months follow-up visit.  The patient is feeling fine today with no specific complaints.  He had a basal cell carcinoma removed from the right side of the neck recently.  He denied having any chest pain, shortness of breath except with exertion with no cough or hemoptysis.  He denied having any recent weight loss or night sweats.  He has no nausea, vomiting, diarrhea or constipation.  The patient had repeat CT scan of the chest performed recently and he is here for evaluation and discussion of his discuss results.   MEDICAL HISTORY: Past Medical History:  Diagnosis Date  . BPH (benign prostatic hyperplasia)   . CAD S/P percutaneous coronary angioplasty January 2006   Abnormal Myoview with inferolateral ischemia: --> 80% prox LAD lesion PCI: Taxus DES 3.0 mm x 16 mm-;; Myoview June 2009 no ischemia or infarction with attenuation artifact  . Dyslipidemia, goal LDL below 70    on simvastatin and tricor  . Full code status 04/23/2015  . GERD (gastroesophageal reflux disease)   . Hypertension   . Osteoarthritis   . Squamous cell lung cancer : Left lower lobe  08/03/2014   Bronchoscopy showed extrinsic compression of the left lower lobe with endobronchial biopsies were negative.  He subsequently had a needle biopsy which diagnosed the mass as squamous cell carcinoma     ALLERGIES:  is allergic to niaspan [niacin er].  MEDICATIONS:  Current Outpatient Medications  Medication Sig Dispense Refill  . aspirin EC 325 MG tablet Take 325 mg by mouth daily.    Marland Kitchen atorvastatin (LIPITOR) 40 MG tablet Take 1 tablet (40 mg total) by mouth daily. 90 tablet 3  . fenofibrate (TRICOR) 145 MG tablet TAKE 1 TABLET(145 MG) BY MOUTH DAILY 90 tablet 2  . GENERLAC 10 GM/15ML SOLN TAKE 30ML BY MOUTH EVERY DAY 2838 mL 0  . isosorbide mononitrate (IMDUR) 30 MG 24 hr tablet Take 1 tablet (30 mg total) by mouth daily. 90 tablet 3  . methimazole (TAPAZOLE) 10 MG tablet Take 1 tablet (10 mg total) by mouth 2 (two) times daily. 180 tablet 1  . traMADol (ULTRAM) 50 MG tablet Take 50 mg by mouth every 6 (six) hours as needed.     No current facility-administered medications for this visit.     SURGICAL HISTORY:  Past Surgical History:  Procedure Laterality Date  . APPENDECTOMY    . CARDIAC CATHETERIZATION  07/2004   80% proximal LAD lesion--PCI, EF 60%  . CAROTID DOPPLERS  09/2016   < 39% Plaque vertebral arteries  . CATARACT EXTRACTION    . NM MYOVIEW LTD  07/2004   inferolatera wall ischemia  . NM MYOVIEW LTD  January 06, 2008    treadmill  myoview - tissue attenuation but no ischemia or  infarction  . PERCUTANEOUS CORONARY STENT INTERVENTION (PCI-S)  2/27/ 2006   TAXUS DES 3.0 mm x 16 mm stent ->  PROX  LAD  . renal doppler  05/03/2007   rgt and lft renal arteries normal patency; rgt and lft kidneys normal size  . TRANSTHORACIC ECHOCARDIOGRAM  08/2016   WITH BUBBLE STUDY: Normal LV function EF 60-65%. GR 1 DD. No PFO or ASD noted. Otherwise normal.  . TRANSTHORACIC ECHOCARDIOGRAM  08/2016   Normal LV size and function with EF of 65%. GR 1 DD. Negative bubble study. No regional wall motion abnormality.  . TRANSTHORACIC ECHOCARDIOGRAM  June 18 ,2009   mild concentric LVH, NORMAL ef, MILDLY   DILATED LEFT ATRIUM  AND MILD SCLEROTIC  AORTIC  VALVE AND OTHERWISE  NORMAL  . UMBILICAL HERNIA REPAIR    . VIDEO ASSISTED THORACOSCOPY (VATS)/ LOBECTOMY Left 08/10/2014   Procedure: VIDEO ASSISTED THORACOSCOPY (VATS)/ LOBECTOMY;  Surgeon: Ivin Poot, MD;  Location: [facility];  Service: Thoracic;  Laterality: Left;  Marland Kitchen VIDEO BRONCHOSCOPY N/A 08/10/2014   Procedure: VIDEO BRONCHOSCOPY;  Surgeon: Ivin Poot, MD;  Location: [facility] OR;  Service: Thoracic;  Laterality: N/A;    REVIEW OF SYSTEMS:  A comprehensive review of systems was negative except for: Constitutional: positive for fatigue   PHYSICAL EXAMINATION: General appearance: alert, cooperative and no distress Head: Normocephalic, without obvious abnormality, atraumatic Neck: no adenopathy, no JVD, supple, symmetrical, trachea midline and thyroid not enlarged, symmetric, no tenderness/mass/nodules Lymph nodes: Cervical, supraclavicular, and axillary nodes normal. Resp: clear to auscultation bilaterally Back: symmetric, no curvature. ROM normal. No CVA tenderness. Cardio: normal apical impulse GI: soft, non-tender; bowel sounds normal; no masses,  no organomegaly Extremities: extremities normal, atraumatic, no cyanosis or edema  ECOG PERFORMANCE STATUS: 1 - Symptomatic but completely ambulatory  Blood pressure (!) 143/76, pulse 68, temperature (!) 97.5 F (36.4 C), temperature source Oral, resp. rate 18, height 6\' 1"  (1.854 m), weight 208 lb 6.4 oz (94.5 kg), SpO2 97 %.  LABORATORY DATA: Lab Results  Component Value Date   WBC 7.2 10/01/2017   HGB 14.1 10/01/2017   HCT 42.5 10/01/2017   MCV 93.4 10/01/2017   PLT 160 10/01/2017      Chemistry      Component Value Date/Time   NA 139 10/01/2017 1345   NA 141 03/24/2017 1018   K 4.8 10/01/2017 1345   K 4.2 03/24/2017 1018   CL 104 10/01/2017 1345   CO2 30 (H) 10/01/2017 1345   CO2 25 03/24/2017 1018   BUN 18 10/01/2017 1345   BUN 16.5 03/24/2017 1018   CREATININE 1.26  10/01/2017 1345   CREATININE 1.2 03/24/2017 1018      Component Value Date/Time   CALCIUM 9.3 10/01/2017 1345   CALCIUM 9.6 03/24/2017 1018   ALKPHOS 117 10/01/2017 1345   ALKPHOS 87 03/24/2017 1018   AST 26 10/01/2017 1345   AST 30 03/24/2017 1018   ALT 16 10/01/2017 1345   ALT 17 03/24/2017 1018   BILITOT 0.8 10/01/2017 1345   BILITOT 0.6 09/08/2017 0847   BILITOT 0.56 03/24/2017 1018       RADIOGRAPHIC STUDIES: Ct Chest W Contrast  Result Date: 10/02/2017 CLINICAL DATA:  Follow-up lung cancer. Squamous cell carcinoma stage IIA EXAM: CT CHEST WITH CONTRAST TECHNIQUE: Multidetector CT imaging of the chest was performed during intravenous contrast administration. CONTRAST:  170mL ISOVUE-300 IOPAMIDOL (ISOVUE-300) INJECTION 61% COMPARISON:  03/27/2017 FINDINGS: Cardiovascular: Normal heart size. Aortic atherosclerosis. Calcification within the RCA,  LAD, left main and left circumflex coronary arteries noted. Mediastinum/Nodes: Normal appearance of the thyroid gland. The trachea appears patent and is midline. Normal appearance of the esophagus. No enlarged mediastinal or hilar lymph nodes. Stable 9 mm right hilar lymph node. Lungs/Pleura: Status post left lower lobectomy. Unchanged appearance of 8 mm subpleural nodule overlying the posterior left lung, image 97/2. 3 mm right upper lobe lung nodule is unchanged, image 31/series 5. There is a small left pleural effusion. Small left pleural effusion is unchanged. Scattered calcified granulomas identified. Upper Abdomen: No acute abnormality identified. Musculoskeletal: There is degenerative disc disease identified throughout the thoracic spine. No suspicious bone lesions. IMPRESSION: 1. Stable postoperative change from left lower lobectomy. No findings identified to suggest residual or recurrence of tumor. 2. Aortic Atherosclerosis (ICD10-I70.0) and Emphysema (ICD10-J43.9). Multi vessel coronary artery calcifications noted including left main  disease. 3. Prior granulomatous disease. Electronically Signed   By: Kerby Moors M.D.   On: 10/02/2017 09:41    ASSESSMENT AND PLAN:  This is a very pleasant 84 years old white male with history of stage IIA non-small cell lung cancer, squamous cell carcinoma status post left lower lobectomy with lymph node dissection followed by adjuvant systemic chemotherapy with carboplatin and gemcitabine for 4 cycles. The patient has been observation since June 2016. The patient has been in observation for the last 3 years.  He is doing fine with no concerning complaints.  His most recent CT scan of the chest showed no concerning findings for disease recurrence or progression. I will see him back for follow-up visit in 6 months with repeat CT scan of the chest. The patient voices understanding of current disease status and treatment options and is in agreement with the current care plan. All questions were answered. The patient knows to call the clinic with any problems, questions or concerns. We can certainly see the patient much sooner if necessary. I spent 10 minutes counseling the patient face to face. The total time spent in the appointment was 15 minutes.  Disclaimer: This note was dictated with voice recognition software. Similar sounding words can inadvertently be transcribed and may not be corrected upon review.

## 2017-11-06 ENCOUNTER — Emergency Department (HOSPITAL_BASED_OUTPATIENT_CLINIC_OR_DEPARTMENT_OTHER)
Admission: EM | Admit: 2017-11-06 | Discharge: 2017-11-06 | Disposition: A | Discharge status: Home / Self Care | Payer: Medicare Other | Attending: Emergency Medicine | Admitting: Emergency Medicine

## 2017-11-06 ENCOUNTER — Other Ambulatory Visit: Payer: Self-pay

## 2017-11-06 ENCOUNTER — Encounter (HOSPITAL_BASED_OUTPATIENT_CLINIC_OR_DEPARTMENT_OTHER): Payer: Self-pay | Admitting: *Deleted

## 2017-11-06 ENCOUNTER — Emergency Department (HOSPITAL_BASED_OUTPATIENT_CLINIC_OR_DEPARTMENT_OTHER): Payer: Medicare Other

## 2017-11-06 DIAGNOSIS — I10 Essential (primary) hypertension: Secondary | ICD-10-CM | POA: Diagnosis not present

## 2017-11-06 DIAGNOSIS — Z79899 Other long term (current) drug therapy: Secondary | ICD-10-CM | POA: Diagnosis not present

## 2017-11-06 DIAGNOSIS — E860 Dehydration: Secondary | ICD-10-CM | POA: Insufficient documentation

## 2017-11-06 DIAGNOSIS — E0590 Thyrotoxicosis, unspecified without thyrotoxic crisis or storm: Secondary | ICD-10-CM | POA: Insufficient documentation

## 2017-11-06 DIAGNOSIS — Z85118 Personal history of other malignant neoplasm of bronchus and lung: Secondary | ICD-10-CM | POA: Insufficient documentation

## 2017-11-06 DIAGNOSIS — R350 Frequency of micturition: Secondary | ICD-10-CM | POA: Diagnosis not present

## 2017-11-06 DIAGNOSIS — I2510 Atherosclerotic heart disease of native coronary artery without angina pectoris: Secondary | ICD-10-CM | POA: Insufficient documentation

## 2017-11-06 DIAGNOSIS — Z87891 Personal history of nicotine dependence: Secondary | ICD-10-CM | POA: Insufficient documentation

## 2017-11-06 DIAGNOSIS — Z7982 Long term (current) use of aspirin: Secondary | ICD-10-CM | POA: Diagnosis not present

## 2017-11-06 DIAGNOSIS — R42 Dizziness and giddiness: Secondary | ICD-10-CM | POA: Diagnosis present

## 2017-11-06 DIAGNOSIS — N3000 Acute cystitis without hematuria: Secondary | ICD-10-CM | POA: Insufficient documentation

## 2017-11-06 LAB — CBC
HEMATOCRIT: 44.9 % (ref 39.0–52.0)
Hemoglobin: 15.6 g/dL (ref 13.0–17.0)
MCH: 31.4 pg (ref 26.0–34.0)
MCHC: 34.7 g/dL (ref 30.0–36.0)
MCV: 90.3 fL (ref 78.0–100.0)
PLATELETS: 187 10*3/uL (ref 150–400)
RBC: 4.97 MIL/uL (ref 4.22–5.81)
RDW: 14.7 % (ref 11.5–15.5)
WBC: 6.1 10*3/uL (ref 4.0–10.5)

## 2017-11-06 LAB — URINALYSIS, ROUTINE W REFLEX MICROSCOPIC
BILIRUBIN URINE: NEGATIVE
Glucose, UA: NEGATIVE mg/dL
KETONES UR: NEGATIVE mg/dL
NITRITE: NEGATIVE
PROTEIN: NEGATIVE mg/dL
Specific Gravity, Urine: 1.02 (ref 1.005–1.030)
pH: 6.5 (ref 5.0–8.0)

## 2017-11-06 LAB — BASIC METABOLIC PANEL
ANION GAP: 9 (ref 5–15)
BUN: 18 mg/dL (ref 6–20)
CALCIUM: 9 mg/dL (ref 8.9–10.3)
CO2: 25 mmol/L (ref 22–32)
Chloride: 103 mmol/L (ref 101–111)
Creatinine, Ser: 1.23 mg/dL (ref 0.61–1.24)
GFR, EST NON AFRICAN AMERICAN: 53 mL/min — AB (ref >60)
GLUCOSE: 88 mg/dL (ref 65–99)
POTASSIUM: 3.9 mmol/L (ref 3.5–5.1)
SODIUM: 137 mmol/L (ref 135–145)

## 2017-11-06 LAB — URINALYSIS, MICROSCOPIC (REFLEX)

## 2017-11-06 LAB — MAGNESIUM: MAGNESIUM: 2 mg/dL (ref 1.7–2.4)

## 2017-11-06 LAB — CBG MONITORING, ED: GLUCOSE-CAPILLARY: 85 mg/dL (ref 65–99)

## 2017-11-06 MED ORDER — CEPHALEXIN 500 MG PO CAPS: 500.0000 mg | ORAL_CAPSULE | Freq: Two times a day (BID) | ORAL | 0 refills | Status: AC

## 2017-11-06 MED ORDER — SODIUM CHLORIDE 0.9 % IV BOLUS
1000.0000 mL | Freq: Once | INTRAVENOUS | Status: AC
  Administered 2017-11-06: 1000 mL via INTRAVENOUS

## 2017-11-06 NOTE — ED Notes (Signed)
Pt on cardiac monitor and auto VS 

## 2017-11-06 NOTE — ED Triage Notes (Signed)
Dizziness. States he feels like his BP is high. He was seen by his MD yesterday for passing out 4 days ago. He had a CT scan of his head that was normal.

## 2017-11-06 NOTE — ED Notes (Signed)
Patient transported to X-ray 

## 2017-11-06 NOTE — ED Provider Notes (Signed)
[facility] EMERGENCY DEPARTMENT Provider Note   CSN: [id number] Arrival date & time: 11/06/17  1127     History   Chief Complaint Chief Complaint  Patient presents with  . Dizziness    HPI Jeremy Deleon is a 84 y.o. male.  The history is provided by the patient, medical records and a relative.  Dizziness  Quality:  Lightheadedness Severity:  Moderate Onset quality:  Gradual Duration:  1 hour Timing:  Sporadic Progression:  Resolved Chronicity:  Recurrent Context: standing up   Relieved by:  Lying down Worsened by:  Nothing Ineffective treatments:  None tried Associated symptoms: syncope (several days ago)   Associated symptoms: no chest pain, no diarrhea, no headaches, no nausea, no palpitations, no shortness of breath, no vision changes, no vomiting and no weakness   Risk factors: heart disease   Risk factors: no anemia and no new medications     Past Medical History:  Diagnosis Date  . BPH (benign prostatic hyperplasia)   . CAD S/P percutaneous coronary angioplasty January 2006   Abnormal Myoview with inferolateral ischemia: --> 80% prox LAD lesion PCI: Taxus DES 3.0 mm x 16 mm-;; Myoview June 2009 no ischemia or infarction with attenuation artifact  . Dyslipidemia, goal LDL below 70    on simvastatin and tricor  . Full code status 04/23/2015  . GERD (gastroesophageal reflux disease)   . Hypertension   . Osteoarthritis   . Squamous cell lung cancer : Left lower lobe  08/03/2014   Bronchoscopy showed extrinsic compression of the left lower lobe with endobronchial biopsies were negative. He subsequently had a needle biopsy which diagnosed the mass as squamous cell carcinoma     Patient Active Problem List   Diagnosis Date Noted  . Swelling of lower leg 09/14/2017  . Retinal artery plaque 09/01/2016  . Hyperthyroidism 07/26/2015  . Full code status 04/23/2015  . Orthostatic hypotension; with baseline hypotension 11/13/2014  . Cellulitis 10/17/2014  .  Chemotherapy induced neutropenia ([place]) 10/17/2014  . S/P lobectomy of lung 08/10/2014  . Squamous cell lung cancer : Left lower lobe  08/03/2014  . Dyslipidemia, goal LDL below 70 10/25/2013  . CAD S/P percutaneous coronary angioplasty -- Taxus DES in Prox LAD 10/25/2013  . Essential hypertension     Past Surgical History:  Procedure Laterality Date  . APPENDECTOMY    . CARDIAC CATHETERIZATION  07/2004   80% proximal LAD lesion--PCI, EF 60%  . CAROTID DOPPLERS  09/2016   < 39% Plaque vertebral arteries  . CATARACT EXTRACTION    . NM MYOVIEW LTD  07/2004   inferolatera wall ischemia  . NM MYOVIEW LTD  January 06, 2008    treadmill  myoview - tissue attenuation but no ischemia or infarction  . PERCUTANEOUS CORONARY STENT INTERVENTION (PCI-S)  2/27/ 2006   TAXUS DES 3.0 mm x 16 mm stent ->  PROX  LAD  . renal doppler  05/03/2007   rgt and lft renal arteries normal patency; rgt and lft kidneys normal size  . TRANSTHORACIC ECHOCARDIOGRAM  08/2016   WITH BUBBLE STUDY: Normal LV function EF 60-65%. GR 1 DD. No PFO or ASD noted. Otherwise normal.  . TRANSTHORACIC ECHOCARDIOGRAM  08/2016   Normal LV size and function with EF of 65%. GR 1 DD. Negative bubble study. No regional wall motion abnormality.  . TRANSTHORACIC ECHOCARDIOGRAM  June 18 ,2009   mild concentric LVH, NORMAL ef, MILDLY  DILATED LEFT ATRIUM  AND MILD SCLEROTIC  AORTIC  VALVE AND OTHERWISE  NORMAL  . UMBILICAL HERNIA REPAIR    . VIDEO ASSISTED THORACOSCOPY (VATS)/ LOBECTOMY Left 08/10/2014   Procedure: VIDEO ASSISTED THORACOSCOPY (VATS)/ LOBECTOMY;  Surgeon: Ivin Poot, MD;  Location: [facility];  Service: Thoracic;  Laterality: Left;  Marland Kitchen VIDEO BRONCHOSCOPY N/A 08/10/2014   Procedure: VIDEO BRONCHOSCOPY;  Surgeon: Ivin Poot, MD;  Location: [facility] OR;  Service: Thoracic;  Laterality: N/A;        Home Medications    Prior to Admission medications   Medication Sig Start Date End Date Taking? Authorizing Provider  aspirin EC  325 MG tablet Take 325 mg by mouth daily.    [provider]  atorvastatin (LIPITOR) 40 MG tablet Take 1 tablet (40 mg total) by mouth daily. 09/14/17 12/13/17  Leonie Man, MD  fenofibrate (TRICOR) 145 MG tablet TAKE 1 TABLET(145 MG) BY MOUTH DAILY 10/06/17   Leonie Man, MD  [facility] 10 GM/15ML SOLN TAKE 30ML BY MOUTH EVERY DAY 07/04/16   Irene Shipper, MD  isosorbide mononitrate (IMDUR) 30 MG 24 hr tablet Take 1 tablet (30 mg total) by mouth daily. 03/16/17   Leonie Man, MD  methimazole (TAPAZOLE) 10 MG tablet Take 1 tablet (10 mg total) by mouth 2 (two) times daily. 04/27/17   Renato Shin, MD  traMADol (ULTRAM) 50 MG tablet Take 50 mg by mouth every 6 (six) hours as needed. 12/25/15   [provider]    Family History Family History  Problem Relation Age of Onset  . Cancer Mother   . Heart disease Mother   . Heart disease Father   . Parkinsonism Brother   . Thyroid disease Neg Hx     Social History Social History   Tobacco Use  . Smoking status: Former Smoker    Types: Cigarettes    Last attempt to quit: 10/25/1982    Years since quitting: 35.0  . Smokeless tobacco: Never Used  Substance Use Topics  . Alcohol use: No  . Drug use: No     Allergies   Niaspan [niacin er]   Review of Systems Review of Systems  Constitutional: Negative for chills, diaphoresis, fatigue and fever.  HENT: Negative for congestion.   Respiratory: Negative for cough, chest tightness, shortness of breath and stridor.   Cardiovascular: Positive for syncope (several days ago). Negative for chest pain, palpitations and leg swelling.  Gastrointestinal: Negative for abdominal pain, diarrhea, nausea and vomiting.  Genitourinary: Positive for frequency. Negative for dysuria and flank pain.  Musculoskeletal: Negative for back pain, neck pain and neck stiffness.  Neurological: Positive for syncope (5 days ago) and light-headedness. Negative for dizziness, seizures, facial  asymmetry, speech difficulty, weakness and headaches.  Psychiatric/Behavioral: Negative for agitation.  All other systems reviewed and are negative.    Physical Exam Updated Vital Signs BP 139/78   Pulse 70   Temp 97.8 F (36.6 C) (Oral)   Resp 18   Ht 6\' 1"  (1.854 m)   Wt 94.3 kg (208 lb)   SpO2 97%   BMI 27.44 kg/m   Physical Exam  Constitutional: He is oriented to person, place, and time. He appears well-developed and well-nourished. No distress.  HENT:  Head: Normocephalic and atraumatic.  Nose: Nose normal.  Mouth/Throat: Oropharynx is clear and moist.  Eyes: Pupils are equal, round, and reactive to light. Conjunctivae and EOM are normal.  Neck: Normal range of motion. Neck supple.  Cardiovascular: Normal rate, regular rhythm and intact distal pulses.  No murmur heard. Pulmonary/Chest: Effort normal and breath sounds normal. No respiratory distress. He has no wheezes. He has no rales. He exhibits no tenderness.  Abdominal: Soft. He exhibits no distension and no mass. There is no tenderness. There is no guarding.  Musculoskeletal: He exhibits no edema or tenderness.  Lymphadenopathy:    He has no cervical adenopathy.  Neurological: He is alert and oriented to person, place, and time. He is not disoriented. He displays no tremor. No cranial nerve deficit or sensory deficit. He exhibits normal muscle tone. He displays no seizure activity. Coordination and gait normal. GCS eye subscore is 4. GCS verbal subscore is 5. GCS motor subscore is 6.  Skin: Skin is warm and dry. Capillary refill takes less than 2 seconds. He is not diaphoretic. No erythema. No pallor.  Psychiatric: He has a normal mood and affect.  Nursing note and vitals reviewed.    ED Treatments / Results  Labs (all labs ordered are listed, but only abnormal results are displayed) Labs Reviewed  BASIC METABOLIC PANEL - Abnormal; Notable for the following components:      Result Value   GFR calc non Af Amer  53 (*)    All other components within normal limits  URINALYSIS, ROUTINE W REFLEX MICROSCOPIC - Abnormal; Notable for the following components:   Hgb urine dipstick TRACE (*)    Leukocytes, UA SMALL (*)    All other components within normal limits  URINALYSIS, MICROSCOPIC (REFLEX) - Abnormal; Notable for the following components:   Bacteria, UA FEW (*)    Squamous Epithelial / LPF 0-5 (*)    All other components within normal limits  URINE CULTURE  CBC  MAGNESIUM  CBG MONITORING, ED    EKG EKG Interpretation  Date/Time:  Friday November 06 2017 12:04:39 EDT Ventricular Rate:  63 PR Interval:    QRS Duration: 104 QT Interval:  448 QTC Calculation: 459 R Axis:   16 Text Interpretation:  Sinus rhythm Atrial premature complex Borderline repolarization abnormality When compared to prior, no significant changes seen.  no STEMI Confirmed by Antony Blackbird [phone]) on 11/06/2017 1:00:37 PM   Radiology Dg Chest 2 View  Result Date: 11/06/2017 CLINICAL DATA:  Elevated blood pressure today. EXAM: CHEST - 2 VIEW COMPARISON:  PA and lateral chest 10/07/2016. CT chest 09/16/2016 and 10/01/2017. FINDINGS: Small, chronic left pleural effusion and mild basilar atelectasis are unchanged. Calcified granuloma right upper lobe is unchanged. The lungs are otherwise clear. No acute bony abnormality. Remote left rib fracture is unchanged. IMPRESSION: No acute disease. No change in a small, chronic left pleural effusion. Electronically Signed   By: Inge Rise M.D.   On: 11/06/2017 12:52    Procedures Procedures (including critical care time)  Medications Ordered in ED Medications  sodium chloride 0.9 % bolus 1,000 mL (0 mLs Intravenous Stopped 11/06/17 1337)     Initial Impression / Assessment and Plan / ED Course  I have reviewed the triage vital signs and the nursing notes.  Pertinent labs & imaging results that were available during my care of the patient were reviewed by me and considered  in my medical decision making (see chart for details).     RAYMOUND KATICH is a 84 y.o. male with a past medical history significant for CAD status post PCI, lung cancer status post chemotherapy and lobectomy, hypertension, and recent syncopal episode who presents with lightheadedness.  Patient reports that 5 days ago he had a syncopal episode while at church  and standing.  He reports that he got lightheaded and passed out for several seconds.  He fell to the ground but did not hit his head.  He then saw his PCP on Monday who felt that it may been related to blood pressure.  Patient is on blood pressure medication.  Patient had a CT with contrast of the head showing old infarct but no new infarct or evidence of malignancy.  Patient reports feeling well over the last few days.  He reports that this morning he took his blood pressure medicine and then felt lightheaded.  He reports he did not pass out but felt lightheaded for approximately 30 minutes prior to arrival.  He reports that has improved after coming to the ED.  He denies any associated palpitations, chest pain, shortness of breath or palpitations.  He denies any vision changes, speech difficulty, facial droop, or any numbness, tingling, or weakness.  He denies any gait abnormality.  He only reports an episode of lightheadedness that lasted 30 minutes that has resolved.  He says that he does not drink enough fluids and thinks he may be slightly dehydrated.  He denies any recent medication changes.  He denies fevers, chills, nausea, vomiting, conservation, diarrhea.  He denies dysuria but does report some urinary frequency.  He denies any chest discomfort.  On exam, patient had normal gait.  No lightheadedness with standing.  Patient blood pressure was in the 829F systolic.  Patient had no focal neurologic deficits on my exam.  Lungs were clear and chest was nontender.  Abdomen nontender.  I suspect patient has slight dehydration contributing to his  lightheaded episodes potentially in relation to his blood pressure medicine causing soft blood pressures.  He says that he was not feeling lightheaded before taking his blood pressure medicine this morning.  He denies any vertigo or room spinning sensation, doubt stroke at this time.  No focal symptoms.  Although the CT head at the outside clinic showed old stroke,  without any stroke symptoms today aside from the generalized lightheadedness, I do not feel he needs transfer for MRI at this time.    Given the frequency patient will have urinalysis and laboratory testing to look for occult infection.  He will also have a chest x-ray given the collapse and syncopal episode several days ago without chest x-ray since.  EKG appeared similar to prior with no evidence of STEMI.  Patient was given some fluids for rehydration and patient will reassess.  If orthostatics are normal and patient appears well, patient will likely be safe for discharge to follow-up with PCP.  Patient did not feel lightheaded anymore and was feeling slightly better after fluids.  Work-up revealed evidence of urinary tract infection.  In the setting of the patient's urinary frequency with the symptoms, patient will be treated with antibiotics.  Other laboratory testing was overall reassuring.  Do not feel patient has TIA or acute stroke causing the lightheadedness.  Patient will follow up with his PCP in several days and understood strict return precautions.  Patient and family had no other questions or concerns and was discharged in good condition with improving symptoms.    Final Clinical Impressions(s) / ED Diagnoses   Final diagnoses:  Lightheadedness  Dehydration  Urinary frequency  Acute cystitis without hematuria    ED Discharge Orders        Ordered    cephALEXin (KEFLEX) 500 MG capsule  2 times daily     11/06/17 1400  Clinical Impression: 1. Lightheadedness   2. Dehydration   3. Urinary frequency   4.  Acute cystitis without hematuria     Disposition: Discharge  Condition: Good  I have discussed the results, Dx and Tx plan with the pt(& family if present). He/she/they expressed understanding and agree(s) with the plan. Discharge instructions discussed at great length. Strict return precautions discussed and pt &/or family have verbalized understanding of the instructions. No further questions at time of discharge.    New Prescriptions   CEPHALEXIN (KEFLEX) 500 MG CAPSULE    Take 1 capsule (500 mg total) by mouth 2 (two) times daily for 7 days.    Follow Up: [name], [place] [address] [place] [facility] [address] [id number] [address] [place] [phone]       Lavayah Vita, Gwenyth Allegra, MD 11/06/17 [phone]

## 2017-11-06 NOTE — ED Notes (Signed)
Requested urine specimen but pt sts he is not able at this time.

## 2017-11-06 NOTE — Discharge Instructions (Signed)
Your work-up today showed evidence of urinary tract infection.  We suspect your lightheadedness was due to dehydration and a UTI.  We did not find evidence of other concerning abnormality.  Given your reassuring exam, do not feel you need acute MRI tonight.  Please follow-up with your primary care physician for reassessment further management next week.  If any symptoms change or worsen, please return to the nearest emergency department.  Please stay hydrated.

## 2017-11-06 NOTE — ED Notes (Signed)
ED Provider at bedside. 

## 2017-11-07 LAB — URINE CULTURE: Culture: NO GROWTH

## 2018-02-10 ENCOUNTER — Other Ambulatory Visit: Payer: Self-pay | Admitting: Dermatology

## 2018-03-05 ENCOUNTER — Other Ambulatory Visit: Payer: Self-pay | Admitting: Urology

## 2018-03-09 ENCOUNTER — Encounter (HOSPITAL_BASED_OUTPATIENT_CLINIC_OR_DEPARTMENT_OTHER): Payer: Self-pay | Admitting: *Deleted

## 2018-03-09 ENCOUNTER — Other Ambulatory Visit: Payer: Self-pay

## 2018-03-09 NOTE — Progress Notes (Signed)
Spoke w/ pt via phone for pre-op interview.  Npo after mn w/ exception clear liquids until 0715 (no cream/milk products).  Arrive at 1115.  Needs istat.  Current ekg and chest CT in chart and epic.  Will take am meds w/ sips of water dos.

## 2018-03-12 ENCOUNTER — Ambulatory Visit (HOSPITAL_BASED_OUTPATIENT_CLINIC_OR_DEPARTMENT_OTHER): Payer: Medicare Other | Admitting: Certified Registered"

## 2018-03-12 ENCOUNTER — Ambulatory Visit (HOSPITAL_BASED_OUTPATIENT_CLINIC_OR_DEPARTMENT_OTHER)
Admission: RE | Admit: 2018-03-12 | Discharge: 2018-03-12 | Disposition: A | Discharge status: Home / Self Care | Payer: Medicare Other | Source: Ambulatory Visit | Attending: Urology | Admitting: Urology

## 2018-03-12 ENCOUNTER — Encounter (HOSPITAL_BASED_OUTPATIENT_CLINIC_OR_DEPARTMENT_OTHER)
Admission: RE | Disposition: A | Discharge status: Home / Self Care | Payer: Self-pay | Source: Ambulatory Visit | Attending: Urology

## 2018-03-12 ENCOUNTER — Encounter (HOSPITAL_BASED_OUTPATIENT_CLINIC_OR_DEPARTMENT_OTHER): Payer: Self-pay

## 2018-03-12 DIAGNOSIS — Z87891 Personal history of nicotine dependence: Secondary | ICD-10-CM | POA: Insufficient documentation

## 2018-03-12 DIAGNOSIS — E0590 Thyrotoxicosis, unspecified without thyrotoxic crisis or storm: Secondary | ICD-10-CM | POA: Insufficient documentation

## 2018-03-12 DIAGNOSIS — N433 Hydrocele, unspecified: Secondary | ICD-10-CM | POA: Diagnosis not present

## 2018-03-12 DIAGNOSIS — Z85118 Personal history of other malignant neoplasm of bronchus and lung: Secondary | ICD-10-CM | POA: Insufficient documentation

## 2018-03-12 DIAGNOSIS — Z79899 Other long term (current) drug therapy: Secondary | ICD-10-CM | POA: Diagnosis not present

## 2018-03-12 DIAGNOSIS — Z7982 Long term (current) use of aspirin: Secondary | ICD-10-CM | POA: Insufficient documentation

## 2018-03-12 DIAGNOSIS — Z902 Acquired absence of lung [part of]: Secondary | ICD-10-CM | POA: Diagnosis not present

## 2018-03-12 DIAGNOSIS — J449 Chronic obstructive pulmonary disease, unspecified: Secondary | ICD-10-CM | POA: Insufficient documentation

## 2018-03-12 DIAGNOSIS — I2510 Atherosclerotic heart disease of native coronary artery without angina pectoris: Secondary | ICD-10-CM | POA: Diagnosis not present

## 2018-03-12 DIAGNOSIS — I10 Essential (primary) hypertension: Secondary | ICD-10-CM | POA: Diagnosis not present

## 2018-03-12 DIAGNOSIS — Z955 Presence of coronary angioplasty implant and graft: Secondary | ICD-10-CM | POA: Diagnosis not present

## 2018-03-12 DIAGNOSIS — I739 Peripheral vascular disease, unspecified: Secondary | ICD-10-CM | POA: Insufficient documentation

## 2018-03-12 DIAGNOSIS — N5089 Other specified disorders of the male genital organs: Secondary | ICD-10-CM | POA: Diagnosis present

## 2018-03-12 DIAGNOSIS — N430 Encysted hydrocele: Secondary | ICD-10-CM

## 2018-03-12 HISTORY — DX: Hydrocele, unspecified: N43.3

## 2018-03-12 HISTORY — DX: Thyrotoxicosis with diffuse goiter without thyrotoxic crisis or storm: E05.00

## 2018-03-12 HISTORY — DX: Presence of dental prosthetic device (complete) (partial): Z97.2

## 2018-03-12 HISTORY — DX: Presence of spectacles and contact lenses: Z97.3

## 2018-03-12 HISTORY — DX: Complete loss of teeth, unspecified cause, unspecified class: K08.109

## 2018-03-12 HISTORY — PX: HYDROCELE EXCISION: SHX482

## 2018-03-12 HISTORY — DX: Other constipation: K59.09

## 2018-03-12 HISTORY — DX: Presence of external hearing-aid: Z97.4

## 2018-03-12 HISTORY — DX: Emphysema, unspecified: J43.9

## 2018-03-12 HISTORY — DX: Presence of coronary angioplasty implant and graft: Z95.5

## 2018-03-12 HISTORY — DX: Occlusion and stenosis of bilateral carotid arteries: I65.23

## 2018-03-12 HISTORY — DX: Other specified postprocedural states: Z98.890

## 2018-03-12 HISTORY — DX: Personal history of other malignant neoplasm of skin: Z85.828

## 2018-03-12 LAB — POCT I-STAT 4, (NA,K, GLUC, HGB,HCT)
Glucose, Bld: 78 mg/dL (ref 70–99)
HCT: 43 % (ref 39.0–52.0)
Hemoglobin: 14.6 g/dL (ref 13.0–17.0)
Potassium: 4 mmol/L (ref 3.5–5.1)
Sodium: 142 mmol/L (ref 135–145)

## 2018-03-12 SURGERY — HYDROCELECTOMY
Anesthesia: General | Laterality: Left

## 2018-03-12 MED ORDER — FENTANYL CITRATE (PF) 100 MCG/2ML IJ SOLN
INTRAMUSCULAR | Status: DC | PRN
  Administered 2018-03-12: 25 ug via INTRAVENOUS

## 2018-03-12 MED ORDER — ACETAMINOPHEN 500 MG PO TABS
1000.0000 mg | ORAL_TABLET | Freq: Once | ORAL | Status: AC
  Administered 2018-03-12: 1000 mg via ORAL
  Filled 2018-03-12: qty 2

## 2018-03-12 MED ORDER — ONDANSETRON HCL 4 MG/2ML IJ SOLN
4.0000 mg | Freq: Once | INTRAMUSCULAR | Status: DC | PRN
  Filled 2018-03-12: qty 2

## 2018-03-12 MED ORDER — FENTANYL CITRATE (PF) 100 MCG/2ML IJ SOLN
INTRAMUSCULAR | Status: AC
  Filled 2018-03-12: qty 2

## 2018-03-12 MED ORDER — LACTATED RINGERS IV SOLN
INTRAVENOUS | Status: DC
  Administered 2018-03-12 (×2): via INTRAVENOUS
  Filled 2018-03-12: qty 1000

## 2018-03-12 MED ORDER — DEXAMETHASONE SODIUM PHOSPHATE 10 MG/ML IJ SOLN
INTRAMUSCULAR | Status: DC | PRN
  Administered 2018-03-12: 5 mg via INTRAVENOUS

## 2018-03-12 MED ORDER — ONDANSETRON HCL 4 MG/2ML IJ SOLN
INTRAMUSCULAR | Status: DC | PRN
  Administered 2018-03-12: 4 mg via INTRAVENOUS

## 2018-03-12 MED ORDER — TRAMADOL HCL 50 MG PO TABS: 50.0000 mg | ORAL_TABLET | Freq: Four times a day (QID) | ORAL | 0 refills | Status: DC | PRN

## 2018-03-12 MED ORDER — 0.9 % SODIUM CHLORIDE (POUR BTL) OPTIME
TOPICAL | Status: DC | PRN
  Administered 2018-03-12: 1000 mL

## 2018-03-12 MED ORDER — PHENYLEPHRINE 40 MCG/ML (10ML) SYRINGE FOR IV PUSH (FOR BLOOD PRESSURE SUPPORT)
PREFILLED_SYRINGE | INTRAVENOUS | Status: DC | PRN
  Administered 2018-03-12 (×2): 40 ug via INTRAVENOUS
  Administered 2018-03-12: 80 ug via INTRAVENOUS
  Administered 2018-03-12: 40 ug via INTRAVENOUS

## 2018-03-12 MED ORDER — FENTANYL CITRATE (PF) 100 MCG/2ML IJ SOLN
25.0000 ug | INTRAMUSCULAR | Status: DC | PRN
  Filled 2018-03-12: qty 1

## 2018-03-12 MED ORDER — PHENYLEPHRINE 40 MCG/ML (10ML) SYRINGE FOR IV PUSH (FOR BLOOD PRESSURE SUPPORT)
PREFILLED_SYRINGE | INTRAVENOUS | Status: AC
  Filled 2018-03-12: qty 10

## 2018-03-12 MED ORDER — ACETAMINOPHEN 500 MG PO TABS
ORAL_TABLET | ORAL | Status: AC
  Filled 2018-03-12: qty 2

## 2018-03-12 MED ORDER — LIDOCAINE 2% (20 MG/ML) 5 ML SYRINGE
INTRAMUSCULAR | Status: AC
  Filled 2018-03-12: qty 5

## 2018-03-12 MED ORDER — DEXAMETHASONE SODIUM PHOSPHATE 10 MG/ML IJ SOLN
INTRAMUSCULAR | Status: AC
  Filled 2018-03-12: qty 1

## 2018-03-12 MED ORDER — CEFAZOLIN SODIUM-DEXTROSE 2-4 GM/100ML-% IV SOLN
2.0000 g | INTRAVENOUS | Status: AC
  Administered 2018-03-12: 2 g via INTRAVENOUS
  Filled 2018-03-12: qty 100

## 2018-03-12 MED ORDER — PROPOFOL 10 MG/ML IV BOLUS
INTRAVENOUS | Status: DC | PRN
  Administered 2018-03-12: 100 mg via INTRAVENOUS

## 2018-03-12 MED ORDER — BUPIVACAINE HCL (PF) 0.25 % IJ SOLN
INTRAMUSCULAR | Status: DC | PRN
  Administered 2018-03-12: 18 mL

## 2018-03-12 MED ORDER — LIDOCAINE 2% (20 MG/ML) 5 ML SYRINGE
INTRAMUSCULAR | Status: DC | PRN
  Administered 2018-03-12: 60 mg via INTRAVENOUS

## 2018-03-12 MED ORDER — CEFAZOLIN SODIUM-DEXTROSE 2-4 GM/100ML-% IV SOLN
INTRAVENOUS | Status: AC
  Filled 2018-03-12: qty 100

## 2018-03-12 MED ORDER — EPHEDRINE SULFATE-NACL 50-0.9 MG/10ML-% IV SOSY
PREFILLED_SYRINGE | INTRAVENOUS | Status: DC | PRN
  Administered 2018-03-12 (×4): 10 mg via INTRAVENOUS

## 2018-03-12 MED ORDER — PROPOFOL 10 MG/ML IV BOLUS
INTRAVENOUS | Status: AC
  Filled 2018-03-12: qty 20

## 2018-03-12 MED ORDER — ONDANSETRON HCL 4 MG/2ML IJ SOLN
INTRAMUSCULAR | Status: AC
  Filled 2018-03-12: qty 2

## 2018-03-12 MED ORDER — EPHEDRINE 5 MG/ML INJ
INTRAVENOUS | Status: AC
  Filled 2018-03-12: qty 10

## 2018-03-12 SURGICAL SUPPLY — 48 items
ADH SKN CLS APL DERMABOND .7 (GAUZE/BANDAGES/DRESSINGS) ×1
BLADE CLIPPER SURG (BLADE) ×2 IMPLANT
BLADE SURG 15 STRL LF DISP TIS (BLADE) ×1 IMPLANT
BLADE SURG 15 STRL SS (BLADE) ×3
BNDG GAUZE ELAST 4 BULKY (GAUZE/BANDAGES/DRESSINGS) ×3 IMPLANT
BRIEF STRETCH FOR OB PAD LRG (UNDERPADS AND DIAPERS) ×3 IMPLANT
CANISTER SUCT 3000ML PPV (MISCELLANEOUS) ×2 IMPLANT
CANISTER SUCTION 1200CC (MISCELLANEOUS) IMPLANT
CLEANER CAUTERY TIP 5X5 PAD (MISCELLANEOUS) ×1 IMPLANT
COVER BACK TABLE 60X90IN (DRAPES) ×3 IMPLANT
COVER MAYO STAND STRL (DRAPES) ×3 IMPLANT
DERMABOND ADVANCED (GAUZE/BANDAGES/DRESSINGS) ×2
DERMABOND ADVANCED .7 DNX12 (GAUZE/BANDAGES/DRESSINGS) ×1 IMPLANT
DISSECTOR ROUND CHERRY 3/8 STR (MISCELLANEOUS) IMPLANT
DRAIN PENROSE 18X1/4 LTX STRL (WOUND CARE) ×2 IMPLANT
DRAPE LAPAROTOMY 100X72 PEDS (DRAPES) ×3 IMPLANT
ELECT NDL BLADE 2-5/6 (NEEDLE) ×1 IMPLANT
ELECT NEEDLE BLADE 2-5/6 (NEEDLE) ×3 IMPLANT
ELECT REM PT RETURN 9FT ADLT (ELECTROSURGICAL) ×3
ELECTRODE REM PT RTRN 9FT ADLT (ELECTROSURGICAL) ×1 IMPLANT
GAUZE 4X4 16PLY RFD (DISPOSABLE) ×2 IMPLANT
GLOVE BIO SURGEON STRL SZ7.5 (GLOVE) ×3 IMPLANT
GOWN STRL REUS W/ TWL LRG LVL3 (GOWN DISPOSABLE) ×1 IMPLANT
GOWN STRL REUS W/ TWL XL LVL3 (GOWN DISPOSABLE) ×1 IMPLANT
GOWN STRL REUS W/TWL LRG LVL3 (GOWN DISPOSABLE) ×3
GOWN STRL REUS W/TWL XL LVL3 (GOWN DISPOSABLE) ×3
KIT TURNOVER CYSTO (KITS) ×3 IMPLANT
NEEDLE HYPO 22GX1.5 SAFETY (NEEDLE) ×2 IMPLANT
NS IRRIG 500ML POUR BTL (IV SOLUTION) ×3 IMPLANT
PACK BASIN DAY SURGERY FS (CUSTOM PROCEDURE TRAY) ×3 IMPLANT
PAD CLEANER CAUTERY TIP 5X5 (MISCELLANEOUS) ×2
PENCIL BUTTON HOLSTER BLD 10FT (ELECTRODE) ×3 IMPLANT
SUT ETHILON 3 0 PS 1 (SUTURE) ×2 IMPLANT
SUT MNCRL AB 4-0 PS2 18 (SUTURE) ×2 IMPLANT
SUT VIC AB 3-0 SH 27 (SUTURE) ×6
SUT VIC AB 3-0 SH 27X BRD (SUTURE) ×2 IMPLANT
SUT VIC AB 4-0 RB1 27 (SUTURE)
SUT VIC AB 4-0 RB1 27X BRD (SUTURE) IMPLANT
SUT VICRYL 3 0 BR 18  UND (SUTURE) ×2
SUT VICRYL 3 0 BR 18 UND (SUTURE) ×1 IMPLANT
SYR BULB IRRIGATION 50ML (SYRINGE) ×3 IMPLANT
SYR CONTROL 10ML LL (SYRINGE) ×3 IMPLANT
TOWEL OR 17X24 6PK STRL BLUE (TOWEL DISPOSABLE) ×6 IMPLANT
TRAY DSU PREP LF (CUSTOM PROCEDURE TRAY) ×3 IMPLANT
TUBE CONNECTING 12'X1/4 (SUCTIONS) ×1
TUBE CONNECTING 12X1/4 (SUCTIONS) ×2 IMPLANT
WATER STERILE IRR 500ML POUR (IV SOLUTION) ×3 IMPLANT
YANKAUER SUCT BULB TIP NO VENT (SUCTIONS) ×3 IMPLANT

## 2018-03-12 NOTE — Anesthesia Procedure Notes (Signed)
Procedure Name: LMA Insertion Date/Time: 03/12/2018 1:48 PM Performed by: Gwyndolyn Saxon, CRNA Pre-anesthesia Checklist: Patient identified, Emergency Drugs available, Suction available and Patient being monitored Patient Re-evaluated:Patient Re-evaluated prior to induction Oxygen Delivery Method: Circle system utilized Preoxygenation: Pre-oxygenation with 100% oxygen Induction Type: IV induction Ventilation: Mask ventilation without difficulty LMA: LMA inserted LMA Size: 4.0 Number of attempts: 1 Placement Confirmation: positive ETCO2,  CO2 detector and breath sounds checked- equal and bilateral Tube secured with: Tape Dental Injury: Teeth and Oropharynx as per pre-operative assessment

## 2018-03-12 NOTE — Anesthesia Preprocedure Evaluation (Signed)
Anesthesia Evaluation  Patient identified by MRN, date of birth, ID band Patient awake    Reviewed: Allergy & Precautions, NPO status , Patient's Chart, lab work & pertinent test results  Airway Mallampati: II  TM Distance: <3 FB Neck ROM: Full    Dental  (+) Dental Advisory Given, Lower Dentures, Upper Dentures   Pulmonary COPD, former smoker,  SCC s/p L lobectomy   Pulmonary exam normal breath sounds clear to auscultation       Cardiovascular hypertension, Pt. on medications + CAD, + Cardiac Stents (LAD DES) and + Peripheral Vascular Disease  Normal cardiovascular exam Rhythm:Regular Rate:Normal     Neuro/Psych negative neurological ROS     GI/Hepatic Neg liver ROS, GERD  ,  Endo/Other  Hyperthyroidism (h/o Graves disease)   Renal/GU negative Renal ROS   Left hydrocele     Musculoskeletal  (+) Arthritis ,   Abdominal   Peds  Hematology negative hematology ROS (+)   Anesthesia Other Findings Day of surgery medications reviewed with the patient.  Reproductive/Obstetrics                             Anesthesia Physical Anesthesia Plan  ASA: III  Anesthesia Plan: General   Post-op Pain Management:    Induction: Intravenous  PONV Risk Score and Plan: 3 and Dexamethasone and Ondansetron  Airway Management Planned: LMA  Additional Equipment:   Intra-op Plan:   Post-operative Plan: Extubation in OR  Informed Consent: I have reviewed the patients History and Physical, chart, labs and discussed the procedure including the risks, benefits and alternatives for the proposed anesthesia with the patient or authorized representative who has indicated his/her understanding and acceptance.   Dental advisory given  Plan Discussed with: CRNA  Anesthesia Plan Comments: (Risks/benefits of general anesthesia discussed with patient including risk of damage to teeth, lips, gum, and tongue,  nausea/vomiting, allergic reactions to medications, and the possibility of heart attack, stroke and death.  All patient questions answered.  Patient wishes to proceed.)        Anesthesia Quick Evaluation

## 2018-03-12 NOTE — Op Note (Signed)
Preoperative diagnosis:  1. left complex hydrocele   Postoperative diagnosis:  1. left  hydrocele  Procedure:       left  hydrocelectomy  Surgeon: Ardis Hughs, MD  Anesthesia: General  Complications: None  Intraoperative findings:  Loculated fluid collections. Fluid was dark and consistent old blood. Thickened dartos layers consistent with previous infection  EBL: Minimal  Specimens: None  1/4 inch Penrose drain  Indication: Indication: Jeremy Deleon is a 84 y.o. patient with left sided symptomatic hydrocele.  After reviewing the management options for treatment, he elected to proceed with the above surgical procedure(s). We have discussed the potential benefits and risks of the procedure, side effects of the proposed treatment, the likelihood of the patient achieving the goals of the procedure, and any potential problems that might occur during the procedure or recuperation. Informed consent has been obtained.  Description of procedure:  The patient was taken to the operating room and general anesthesia was induced. The patient was placed on the table in supine position, general anesthesia was then induced and an LMA inserted. The scrotum was then prepped and draped in the routine sterile fashion. A timeout was then held with confirmation of antibiotics.  I then made a midline incision through the scrotal median raphae through the skin and into the dartos. Once through several layers the dartos was able to get the left testicle and contents out of the left  hemiscrotum and into the surgical field.   The hydrocele sac was then dissected out removing the overlying layers of the dartos tunica.  The sac was then opened with Metzenbaum scissors and the fluid drained from the sac.  The opening was then continued so that the entire sac was bivalved.  The edges were then removed, leaving a small edge of tissue around the testicle.  The edge of the remaining sac was cauterized.  The  edge was then everted, brought together around the posterior aspect of the testicle and closed in a running fashion using 3-0  Vicryl.  The distal aspect was left open to prevent strangulating the cord.  Meticulous hemostasis was then achieved. The left hemiscrotum was then copiously irrigated and a final check for hemostasis performed. 1/4 inch penrose was then placed in the dependent portion of the left hemi-scrotum and sewed to the skin with a 3-0 nylon.   I then closed the dartos with a 3-0 Vicryl in a running/locking stitch. I closed the skin with a 4-0 Monocryl in a vertical mattress running fashion. I then injected 10 cc of quarter percent Marcaine into the incision, and then placed Dermabond over the incision. A fluff dressing and mesh underpants were then applied area.  The patient tolerated the procedure without any perioperative complications. At the end of the case all last needles and sponges had been accounted for. The patient was returned to the PACU in excellent condition.   Ardis Hughs, M.D.

## 2018-03-12 NOTE — Transfer of Care (Signed)
Immediate Anesthesia Transfer of Care Note  Patient: Jeremy Deleon  Procedure(s) Performed: Procedure(s) (LRB): HYDROCELECTOMY ADULT (Left)  Patient Location: PACU  Anesthesia Type: General  Level of Consciousness: awake, oriented, sedated and patient cooperative  Airway & Oxygen Therapy: Patient Spontanous Breathing and Patient connected to face mask oxygen  Post-op Assessment: Report given to PACU RN and Post -op Vital signs reviewed and stable  Post vital signs: Reviewed and stable  Complications: No apparent anesthesia complications  Last Vitals:  Vitals Value Taken Time  BP    Temp    Pulse    Resp    SpO2      Last Pain:  Vitals:   03/12/18 1147  TempSrc:   PainSc: 0-No pain      Patients Stated Pain Goal: 5 (03/12/18 1147)

## 2018-03-12 NOTE — Discharge Instructions (Signed)
Discharge instructions following scrotal surgery  Call your doctor for:  Fever is greater than 100.5  Severe nausea or vomiting  Increasing pain not controlled by pain medication  Increasing redness or drainage from incisions  The number for questions or concerns is [phone]  Activity level: No lifting greater than 10 pounds (about equal to milk) for the next 2 weeks or until cleared to do so at follow-up appointment.  Otherwise activity as tolerated by comfort level.  Diet: May resume your regular diet as tolerated  Driving: No driving while still taking opiate pain medications (weight at least 6-8 hours after last dose).  No driving if you still sore from surgery as it may limit her ability to react quickly if necessary.   Shower/bath: May shower and get incision wet pad dry immediately following.  Do not scrub vigorously for the next 2-3 weeks.  Do not soak incision (ID soaking in bath or swimming) until told he may do so by Dr., as this may promote a wound infection.  Wound care: There is a plastic drain in place that should be kept clean.  Keep gauze in place to collect any drainage.  It will be removed in several days.  He may cover wounds with sterile gauze as needed to prevent incisions rubbing on close follow-up in any seepage.  Where tight fitting underpants for at least 2 weeks.  He should apply cold compresses (ice or sac of frozen peas/corn) to your scrotum for at least 48 hours to reduce the swelling.  You should expect that his scrotum will swell up initially and then get smaller over the next 2-4 weeks.  Follow-up appointments: Follow-up appointment will be scheduled with Dr. Louis Meckel in 6 weeks for a wound check.  Post Anesthesia Home Care Instructions  Activity: Get plenty of rest for the remainder of the day. A responsible individual must stay with you for 24 hours following the procedure.  For the next 24 hours, DO NOT: -Drive a car -Conservation officer, nature -Drink alcoholic beverages -Take any medication unless instructed by your physician -Make any legal decisions or sign important papers.  Meals: Start with liquid foods such as gelatin or soup. Progress to regular foods as tolerated. Avoid greasy, spicy, heavy foods. If nausea and/or vomiting occur, drink only clear liquids until the nausea and/or vomiting subsides. Call your physician if vomiting continues.  Special Instructions/Symptoms: Your throat may feel dry or sore from the anesthesia or the breathing tube placed in your throat during surgery. If this causes discomfort, gargle with warm salt water. The discomfort should disappear within 24 hours.  If you had a scopolamine patch placed behind your ear for the management of post- operative nausea and/or vomiting:  1. The medication in the patch is effective for 72 hours, after which it should be removed.  Wrap patch in a tissue and discard in the trash. Wash hands thoroughly with soap and water. 2. You may remove the patch earlier than 72 hours if you experience unpleasant side effects which may include dry mouth, dizziness or visual disturbances. 3. Avoid touching the patch. Wash your hands with soap and water after contact with the patch.

## 2018-03-12 NOTE — H&P (Signed)
Scrotal swelling  HPI: Jeremy Deleon is a 84 year-old male established patient who is here for scrotal swelling.  He first noticed his hydrocele 6 weeks ago. His hydrocele is on the left side. The patient has not had a scrotal ultrasound.   He does have pain on the side of his hydrocele. He is currently having trouble urinating.   The patient has not been treated for a recent UTI. He has not had a testicular infection. He has not had injuries to the testicles or scrotum.   He has not had scrotal surgery.   Patient here today for follow up. He states his left sided scrotal swelling has gotten bigger and has become painful.  Patient is scheduled for a scrotal ultrasound in our office on 03/23/18.     ALLERGIES: Niaspan TBCR    MEDICATIONS: Aspirin 325 mg tablet Oral  Atorvastatin Calcium 40 mg tablet  Fenofibrate 145 mg tablet  Generlac 10 gram/15 ml solution, oral  Isosorbide Mononitrate Er 30 mg tablet, extended release 24 hr Oral  Methimazole 5 mg tablet  Tramadol Hcl 50 mg tablet Oral     GU PSH: No GU PSH      PSH Notes: Lung Surgery   NON-GU PSH: Lung Surgery (Unspecified) - 2016    GU PMH: Hydrocele, Unspec, Left - 02/18/2018 BPH w/LUTS - 03/13/2016, Benign prostatic hyperplasia with urinary obstruction, - 2017 Urinary Frequency - 03/13/2016 Microscopic hematuria, Asymptomatic microscopic hematuria - 2017 Nocturia, Nocturia - 2016    NON-GU PMH: Encounter for general adult medical examination without abnormal findings, Encounter for preventive health examination - 2017 Lung Cancer, History, History of lung cancer - 2016 Personal history of other diseases of the circulatory system, History of cardiac disorder - 2016 Personal history of other diseases of the musculoskeletal system and connective tissue, History of arthritis - 2016    FAMILY HISTORY: Lung Cancer - Runs In Family   SOCIAL HISTORY: No Social History     Notes: Caffeine use, Retired, Number of children, Former  smoker, Alcohol use   REVIEW OF SYSTEMS:    GU Review Male:   Patient denies frequent urination, hard to postpone urination, burning/ pain with urination, get up at night to urinate, leakage of urine, stream starts and stops, trouble starting your stream, have to strain to urinate , erection problems, and penile pain.  Gastrointestinal (Upper):   Patient denies nausea, vomiting, and indigestion/ heartburn.  Gastrointestinal (Lower):   Patient denies diarrhea and constipation.  Constitutional:   Patient denies fever, night sweats, weight loss, and fatigue.  Skin:   Patient denies skin rash/ lesion and itching.  Eyes:   Patient denies blurred vision and double vision.  Ears/ Nose/ Throat:   Patient denies sore throat and sinus problems.  Hematologic/Lymphatic:   Patient denies swollen glands and easy bruising.  Cardiovascular:   Patient denies leg swelling and chest pains.  Respiratory:   Patient denies cough and shortness of breath.  Endocrine:   Patient denies excessive thirst.  Musculoskeletal:   Patient denies back pain and joint pain.  Neurological:   Patient denies headaches and dizziness.  Psychologic:   Patient denies depression and anxiety.   VITAL SIGNS:      03/02/2018 12:55 PM  Weight 211 lb / 95.71 kg  BP 116/67 mmHg  Pulse 76 /min  Temperature 97.7 F / 36.5 C   GU PHYSICAL EXAMINATION:    Scrotum: No lesions. No edema. No cysts. No warts.  Epididymides: Right: no spermatocele,  no masses, no cysts, no tenderness, no induration, no enlargement. Left: no spermatocele, no masses, no cysts, no tenderness, no induration, no enlargement.  Testes: 5+ cm hydrocele left testis, positive trans-illumination. No tenderness, no swelling, no enlargement left testis. No tenderness, no swelling, no enlargement right testis. Normal location left testis. Normal location right testis. No mass, no cyst, no varicocele left testis. No mass, no cyst, no varicocele, no hydrocele right testis.    Urethral Meatus: Normal size. No lesion, no wart, no discharge, no polyp. Normal location.  Penis: Penis uncircumcised. No foreskin warts, no cracks. No dorsal peyronie's plaques, no left corporal peyronie's plaques, no right corporal peyronie's plaques, no scarring, no shaft warts. No balanitis, no meatal stenosis.   Seminal Vesicles: Nonpalpable.   MULTI-SYSTEM PHYSICAL EXAMINATION:    Constitutional: Well-nourished. No physical deformities. Normally developed. Good grooming.  Respiratory: No labored breathing, no use of accessory muscles.   Cardiovascular: Normal temperature, normal extremity pulses, no swelling, no varicosities.  Lymphatic: No enlargement of neck, axillae, groin.  Skin: No paleness, no jaundice, no cyanosis. No lesion, no ulcer, no rash.  Neurologic / Psychiatric: Oriented to time, oriented to place, oriented to person. No depression, no anxiety, no agitation.  Gastrointestinal: No hernia. No mass, no tenderness, no rigidity, non obese abdomen.   Musculoskeletal: Normal gait and station of head and neck.     PAST DATA REVIEWED:  Source Of History:  Patient  Records Review:   Previous Patient Records, POC Tool   PROCEDURES: None   ASSESSMENT:      ICD-10 Details  1 GU:   Hydrocele - N43.0    PLAN:           Schedule Return Visit/Planned Activity: ASAP - Schedule Surgery          Document Letter(s):  Created for Patient: Clinical Summary         Notes:   The patient has a left-sided hydrocele on exam today. He is symptomatic and wishes to have it treated. We discussed treatment options including observation, aspiration and sclerosis, and surgery. However the risk and the benefits of all 3. I ultimately suggested that the patient strongly consider hydrocelectomy given its efficacy. I discussed the surgery with the patient detail including the risks and benefits. The patient understands that following the procedure and he will have a small drain in the scrotum  for several days. The patient still needs a hydrocele to confirm that this is truly just a hydrocele. We will get that in the coming days. I will review the ultrasound and call the patient, but plan to schedule him for surgery.

## 2018-03-15 ENCOUNTER — Encounter (HOSPITAL_BASED_OUTPATIENT_CLINIC_OR_DEPARTMENT_OTHER): Payer: Self-pay | Admitting: Urology

## 2018-03-15 NOTE — Anesthesia Postprocedure Evaluation (Signed)
Anesthesia Post Note  Patient: Jeremy Deleon  Procedure(s) Performed: HYDROCELECTOMY ADULT (Left )     Patient location during evaluation: PACU Anesthesia Type: General Level of consciousness: awake and alert, awake and oriented Pain management: pain level controlled Vital Signs Assessment: post-procedure vital signs reviewed and stable Respiratory status: spontaneous breathing, nonlabored ventilation and respiratory function stable Cardiovascular status: blood pressure returned to baseline and stable Postop Assessment: no apparent nausea or vomiting Anesthetic complications: no    Last Vitals:  Vitals:   03/12/18 1525 03/12/18 1625  BP: 136/77 (!) 147/79  Pulse: 71 72  Resp: 14 16  Temp:  (!) 36.3 C  SpO2: 95% 97%    Last Pain:  Vitals:   03/12/18 1625  TempSrc: Oral  PainSc: 0-No pain                 Catalina Gravel

## 2018-04-16 ENCOUNTER — Ambulatory Visit (HOSPITAL_COMMUNITY)
Admission: RE | Admit: 2018-04-16 | Discharge: 2018-04-16 | Disposition: A | Discharge status: Home / Self Care | Payer: Medicare Other | Source: Ambulatory Visit | Attending: Internal Medicine | Admitting: Internal Medicine

## 2018-04-16 ENCOUNTER — Inpatient Hospital Stay: Payer: Medicare Other | Attending: Internal Medicine

## 2018-04-16 DIAGNOSIS — R918 Other nonspecific abnormal finding of lung field: Secondary | ICD-10-CM | POA: Insufficient documentation

## 2018-04-16 DIAGNOSIS — C3490 Malignant neoplasm of unspecified part of unspecified bronchus or lung: Secondary | ICD-10-CM | POA: Diagnosis present

## 2018-04-16 DIAGNOSIS — E0500 Thyrotoxicosis with diffuse goiter without thyrotoxic crisis or storm: Secondary | ICD-10-CM | POA: Diagnosis not present

## 2018-04-16 DIAGNOSIS — Z7982 Long term (current) use of aspirin: Secondary | ICD-10-CM | POA: Diagnosis not present

## 2018-04-16 DIAGNOSIS — Z902 Acquired absence of lung [part of]: Secondary | ICD-10-CM | POA: Insufficient documentation

## 2018-04-16 DIAGNOSIS — K8020 Calculus of gallbladder without cholecystitis without obstruction: Secondary | ICD-10-CM | POA: Insufficient documentation

## 2018-04-16 DIAGNOSIS — Z9221 Personal history of antineoplastic chemotherapy: Secondary | ICD-10-CM | POA: Diagnosis not present

## 2018-04-16 DIAGNOSIS — I10 Essential (primary) hypertension: Secondary | ICD-10-CM | POA: Diagnosis not present

## 2018-04-16 DIAGNOSIS — J90 Pleural effusion, not elsewhere classified: Secondary | ICD-10-CM | POA: Diagnosis not present

## 2018-04-16 DIAGNOSIS — C3432 Malignant neoplasm of lower lobe, left bronchus or lung: Secondary | ICD-10-CM | POA: Insufficient documentation

## 2018-04-16 DIAGNOSIS — J439 Emphysema, unspecified: Secondary | ICD-10-CM | POA: Diagnosis not present

## 2018-04-16 LAB — CMP (CANCER CENTER ONLY)
ALT: 12 U/L (ref 0–44)
ANION GAP: 9 (ref 5–15)
AST: 23 U/L (ref 15–41)
Albumin: 3.7 g/dL (ref 3.5–5.0)
Alkaline Phosphatase: 94 U/L (ref 38–126)
BILIRUBIN TOTAL: 0.4 mg/dL (ref 0.3–1.2)
BUN: 17 mg/dL (ref 8–23)
CHLORIDE: 107 mmol/L (ref 98–111)
CO2: 27 mmol/L (ref 22–32)
Calcium: 9 mg/dL (ref 8.9–10.3)
Creatinine: 1.14 mg/dL (ref 0.61–1.24)
GFR, Estimated: 58 mL/min — ABNORMAL LOW (ref >60)
Glucose, Bld: 80 mg/dL (ref 70–99)
POTASSIUM: 3.9 mmol/L (ref 3.5–5.1)
Sodium: 143 mmol/L (ref 135–145)
TOTAL PROTEIN: 6.5 g/dL (ref 6.5–8.1)

## 2018-04-16 LAB — CBC WITH DIFFERENTIAL (CANCER CENTER ONLY)
BASOS ABS: 0.1 10*3/uL (ref 0.0–0.1)
Basophils Relative: 1 %
Eosinophils Absolute: 0.5 10*3/uL (ref 0.0–0.5)
Eosinophils Relative: 8 %
HCT: 42.2 % (ref 38.4–49.9)
HEMOGLOBIN: 13.9 g/dL (ref 13.0–17.1)
Lymphocytes Relative: 29 %
Lymphs Abs: 1.8 10*3/uL (ref 0.9–3.3)
MCH: 31 pg (ref 27.2–33.4)
MCHC: 32.9 g/dL (ref 32.0–36.0)
MCV: 94.2 fL (ref 79.3–98.0)
Monocytes Absolute: 0.5 10*3/uL (ref 0.1–0.9)
Monocytes Relative: 8 %
NEUTROS PCT: 54 %
Neutro Abs: 3.4 10*3/uL (ref 1.5–6.5)
Platelet Count: 169 10*3/uL (ref 140–400)
RBC: 4.48 MIL/uL (ref 4.20–5.82)
RDW: 15.2 % — ABNORMAL HIGH (ref 11.0–14.6)
WBC Count: 6.3 10*3/uL (ref 4.0–10.3)

## 2018-04-16 MED ORDER — SODIUM CHLORIDE 0.9 % IJ SOLN
INTRAMUSCULAR | Status: AC
  Filled 2018-04-16: qty 50

## 2018-04-16 MED ORDER — IOHEXOL 300 MG/ML  SOLN
75.0000 mL | Freq: Once | INTRAMUSCULAR | Status: AC | PRN
  Administered 2018-04-16: 75 mL via INTRAVENOUS

## 2018-04-19 ENCOUNTER — Telehealth: Payer: Self-pay | Admitting: Internal Medicine

## 2018-04-19 ENCOUNTER — Inpatient Hospital Stay: Payer: Medicare Other | Admitting: Internal Medicine

## 2018-04-19 ENCOUNTER — Encounter: Payer: Self-pay | Admitting: Internal Medicine

## 2018-04-19 VITALS — BP 126/56 | HR 66 | Temp 97.6°F | Resp 17 | Ht 73.0 in | Wt 209.9 lb

## 2018-04-19 DIAGNOSIS — C3432 Malignant neoplasm of lower lobe, left bronchus or lung: Secondary | ICD-10-CM | POA: Diagnosis not present

## 2018-04-19 DIAGNOSIS — Z9221 Personal history of antineoplastic chemotherapy: Secondary | ICD-10-CM

## 2018-04-19 DIAGNOSIS — C3492 Malignant neoplasm of unspecified part of left bronchus or lung: Secondary | ICD-10-CM

## 2018-04-19 DIAGNOSIS — Z7982 Long term (current) use of aspirin: Secondary | ICD-10-CM

## 2018-04-19 DIAGNOSIS — Z902 Acquired absence of lung [part of]: Secondary | ICD-10-CM

## 2018-04-19 DIAGNOSIS — E0500 Thyrotoxicosis with diffuse goiter without thyrotoxic crisis or storm: Secondary | ICD-10-CM | POA: Diagnosis not present

## 2018-04-19 DIAGNOSIS — I10 Essential (primary) hypertension: Secondary | ICD-10-CM | POA: Diagnosis not present

## 2018-04-19 DIAGNOSIS — J90 Pleural effusion, not elsewhere classified: Secondary | ICD-10-CM | POA: Diagnosis not present

## 2018-04-19 DIAGNOSIS — C3490 Malignant neoplasm of unspecified part of unspecified bronchus or lung: Secondary | ICD-10-CM

## 2018-04-19 NOTE — Progress Notes (Signed)
[place] [phone]   [phone]  OFFICE PROGRESS NOTE  [name], Shyrl Numbers, MD [place] [address]  DIAGNOSIS: Stage IIA (T2b, N0, M0) non-small cell lung cancer consistent with squamous cell carcinoma diagnosed in January 2016  PRIOR THERAPY:  1) Status post left VATS with left lower lobectomy and mediastinal lymph node dissection under the care of Dr. Prescott Gum on 08/10/2014. 2) Adjuvant systemic chemotherapy with carboplatin for AUC of 5 on day 1 and gemcitabine 1000 MG/M2 on days 1 and 8 every 3 weeks. First dose 09/19/2014.  CURRENT THERAPY: Observation.  INTERVAL HISTORY: Jeremy Deleon 84 y.o. male returns to the clinic today for six-month follow-up visit.  The patient is feeling fine today with no concerning complaints.  He had skin surgery close to the left eye for basal cell carcinoma performed recently.  He has some ecchymosis in that area.  He denied having any current chest pain but has shortness of breath with exertion with no cough or hemoptysis.  He denied having any recent weight loss or night sweats.  He has no nausea, vomiting, diarrhea or constipation.  He had repeat CT scan of the chest performed recently and he is here for evaluation and discussion of his discuss results.  MEDICAL HISTORY: Past Medical History:  Diagnosis Date  . BPH (benign prostatic hyperplasia)   . CAD S/P percutaneous coronary angioplasty cardiologist-- dr Ellyn Hack   07-25-2004 Abnormal Myoview with inferolateral ischemia: --> per cardiac cath 09-16-2004 -- 80% prox LAD lesion PCI: Taxus DES 3.0 mm x 16 mm-;; Myoview June 2009 no ischemia or infarction with attenuation artifact  . Chronic constipation   . Dyslipidemia, goal LDL below 70    on simvastatin and tricor  . Emphysema of lung ([place])   . Full dentures   . GERD (gastroesophageal reflux disease)   . Graves' disease 11/ 2016 dx   endocrinologist-  dr Renato Shin-- treated with tapazole until 2018 when labs were normal  . History of basal cell carcinoma (BCC) excision   . Hypertension   . Left hydrocele   . Mild atherosclerosis of both carotid arteries    duplex 09-09-2016  1-39% bilateral ICA  . Osteoarthritis   . S/P drug eluting coronary stent placement 09/16/2004   x1  to prox. LAD  . Squamous cell lung cancer : Left lower lobe  oncologist-  dr Julien Nordmann---  per lov note in epic no recurrence   dx 01/ 2016-- s/p  Bronchoscopy showed extrinsic compression of the left lower lobe with endobronchial biopsies were negative. He subsequently had a needle biopsy which dx the mass as squamous cell carcinoma /  08-10-2014  s/p  Left VATS w/ LLlobectomy with node dissections and completed chemotherapy (11/2014)  . Wears glasses   . Wears hearing aid in both ears     ALLERGIES:  is allergic to niaspan [niacin er].  MEDICATIONS:  Current Outpatient Medications  Medication Sig Dispense Refill  . aspirin EC 325 MG tablet Take 325 mg by mouth daily.    Marland Kitchen atorvastatin (LIPITOR) 40 MG tablet Take 1 tablet (40 mg total) by mouth daily. (Patient taking differently: Take 40 mg by mouth every morning. ) 90 tablet 3  . Bisacodyl (LAXATIVE PO) Take by mouth as needed.    . fenofibrate (TRICOR) 145 MG tablet TAKE 1 TABLET(145 MG) BY MOUTH DAILY (Patient taking differently: Take 145 mg by mouth every morning. ) 90  tablet 2  . isosorbide mononitrate (IMDUR) 30 MG 24 hr tablet Take 1 tablet (30 mg total) by mouth daily. (Patient taking differently: Take 30 mg by mouth every morning. ) 90 tablet 3  . traMADol (ULTRAM) 50 MG tablet Take 50 mg by mouth every 6 (six) hours as needed.    . traMADol (ULTRAM) 50 MG tablet Take 1-2 tablets (50-100 mg total) by mouth every 6 (six) hours as needed for moderate pain. 15 tablet 0   No current facility-administered medications for this visit.     SURGICAL HISTORY:  Past Surgical History:  Procedure Laterality Date  .  APPENDECTOMY  child  . CATARACT EXTRACTION W/ INTRAOCULAR LENS  IMPLANT, BILATERAL  2016 & 2017  . HYDROCELE EXCISION Left 03/12/2018   Procedure: HYDROCELECTOMY ADULT;  Surgeon: Ardis Hughs, MD;  Location: [facility];  Service: Urology;  Laterality: Left;  . NM MYOVIEW LTD  January 06, 2008    treadmill  myoview - tissue attenuation but no ischemia or infarction  . PERCUTANEOUS CORONARY STENT INTERVENTION (PCI-S)  2/27/ 2006    dr Tami Ribas  @[facility]    (07-25-2004 positive cardiolite for ischemia)  balloon angioplasty and TAXUS DES 3.0 mm x 16 mm stent ->  PROX  LAD (80%)  . TRANSTHORACIC ECHOCARDIOGRAM  09-16-2016  limited w/ bubble study   ef 60- 65%, GR 1 DD./ Negative bubble study/  mild AR/  mild dilated ascending aorta/ No regional wall motion abnormality  . UMBILICAL HERNIA REPAIR  11-23-2007    dr Rise Patience   @[facility]   . VIDEO ASSISTED THORACOSCOPY (VATS)/ LOBECTOMY Left 08/10/2014   Procedure: VIDEO ASSISTED THORACOSCOPY (VATS)/ LOBECTOMY;  Surgeon: Ivin Poot, MD;  Location: [place] [place];  Service: Thoracic;  Laterality: Left;  Marland Kitchen VIDEO BRONCHOSCOPY N/A 08/10/2014   Procedure: VIDEO BRONCHOSCOPY;  Surgeon: Ivin Poot, MD;  Location: [facility];  Service: Thoracic;  Laterality: N/A;    REVIEW OF SYSTEMS:  A comprehensive review of systems was negative except for: Constitutional: positive for fatigue Respiratory: positive for dyspnea on exertion   PHYSICAL EXAMINATION: General appearance: alert, cooperative and no distress Head: Normocephalic, without obvious abnormality, atraumatic Neck: no adenopathy, no JVD, supple, symmetrical, trachea midline and thyroid not enlarged, symmetric, no tenderness/mass/nodules Lymph nodes: Cervical, supraclavicular, and axillary nodes normal. Resp: clear to auscultation bilaterally Back: symmetric, no curvature. ROM normal. No CVA tenderness. Cardio: normal apical impulse GI: soft, non-tender; bowel sounds normal; no masses,  no  organomegaly Extremities: extremities normal, atraumatic, no cyanosis or edema  ECOG PERFORMANCE STATUS: 1 - Symptomatic but completely ambulatory  Blood pressure (!) 126/56, pulse 66, temperature 97.6 F (36.4 C), temperature source Oral, resp. rate 17, height 6\' 1"  (1.854 m), weight 209 lb 14.4 oz (95.2 kg), SpO2 98 %.  LABORATORY DATA: Lab Results  Component Value Date   WBC 6.3 04/16/2018   HGB 13.9 04/16/2018   HCT 42.2 04/16/2018   MCV 94.2 04/16/2018   PLT 169 04/16/2018      Chemistry      Component Value Date/Time   NA 143 04/16/2018 1444   NA 141 03/24/2017 1018   K 3.9 04/16/2018 1444   K 4.2 03/24/2017 1018   CL 107 04/16/2018 1444   CO2 27 04/16/2018 1444   CO2 25 03/24/2017 1018   BUN 17 04/16/2018 1444   BUN 16.5 03/24/2017 1018   CREATININE 1.14 04/16/2018 1444   CREATININE 1.2 03/24/2017 1018      Component Value Date/Time  CALCIUM 9.0 04/16/2018 1444   CALCIUM 9.6 03/24/2017 1018   ALKPHOS 94 04/16/2018 1444   ALKPHOS 87 03/24/2017 1018   AST 23 04/16/2018 1444   AST 30 03/24/2017 1018   ALT 12 04/16/2018 1444   ALT 17 03/24/2017 1018   BILITOT 0.4 04/16/2018 1444   BILITOT 0.56 03/24/2017 1018       RADIOGRAPHIC STUDIES: Ct Chest W Contrast  Result Date: 04/19/2018 CLINICAL DATA:  Left-sided lung cancer. EXAM: CT CHEST WITH CONTRAST TECHNIQUE: Multidetector CT imaging of the chest was performed during intravenous contrast administration. CONTRAST:  67mL OMNIPAQUE IOHEXOL 300 MG/ML  SOLN COMPARISON:  10/01/2017 FINDINGS: Cardiovascular: The heart size is normal. No substantial pericardial effusion. Coronary artery calcification is evident. Atherosclerotic calcification is noted in the wall of the thoracic aorta. Mediastinum/Nodes: No mediastinal lymphadenopathy. There is no hilar lymphadenopathy. The esophagus has normal imaging features. There is no axillary lymphadenopathy. Lungs/Pleura: Status post left lower lobectomy. The central  tracheobronchial airways are patent. Biapical pleuroparenchymal scarring again noted. 4 mm posterior right apical nodule (27/5) is stable. 4 mm peripheral left lung nodule (99/5) is unchanged. Tiny chronic left pleural effusion is stable. Other scattered tiny parenchymal and perifissural nodules are similar. Calcified granulomas in the right lung again noted. Upper Abdomen: Calcified gallstones evident. Musculoskeletal: No worrisome lytic or sclerotic osseous abnormality. IMPRESSION: 1. Stable exam.  No new or progressive interval findings. 2. Status post left lower lobectomy. 3. Scattered bilateral tiny calcified and noncalcified pulmonary nodules are unchanged in the interval. 4.  Aortic Atherosclerois (ICD10-170.0) 5.  Emphysema. (ICD10-J43.9) 6. Cholelithiasis Electronically Signed   By: Misty Stanley M.D.   On: 04/19/2018 08:27    ASSESSMENT AND PLAN:  This is a very pleasant 84 years old white male with history of stage IIA non-small cell lung cancer, squamous cell carcinoma status post left lower lobectomy with lymph node dissection followed by adjuvant systemic chemotherapy with carboplatin and gemcitabine for 4 cycles. The patient has been observation since June 2016. The patient is feeling fine today with no concerning complaints.  Repeat CT scan of the chest performed recently showed no concerning findings for disease progression. I discussed the scan results with the patient and recommended for him to continue on observation with repeat CT scan of the chest in 6 months. He was advised to call immediately if he has any concerning symptoms in the interval. The patient voices understanding of current disease status and treatment options and is in agreement with the current care plan. All questions were answered. The patient knows to call the clinic with any problems, questions or concerns. We can certainly see the patient much sooner if necessary. I spent 10 minutes counseling the patient face to  face. The total time spent in the appointment was 15 minutes.  Disclaimer: This note was dictated with voice recognition software. Similar sounding words can inadvertently be transcribed and may not be corrected upon review.

## 2018-04-19 NOTE — Telephone Encounter (Signed)
Gave pt avs and calendar  °

## 2018-04-22 ENCOUNTER — Other Ambulatory Visit: Payer: Self-pay | Admitting: Cardiology

## 2018-06-22 ENCOUNTER — Emergency Department (HOSPITAL_BASED_OUTPATIENT_CLINIC_OR_DEPARTMENT_OTHER)
Admission: EM | Admit: 2018-06-22 | Discharge: 2018-06-22 | Disposition: A | Discharge status: Home / Self Care | Payer: Medicare Other | Attending: Emergency Medicine | Admitting: Emergency Medicine

## 2018-06-22 ENCOUNTER — Emergency Department (HOSPITAL_BASED_OUTPATIENT_CLINIC_OR_DEPARTMENT_OTHER): Payer: Medicare Other

## 2018-06-22 ENCOUNTER — Encounter (HOSPITAL_BASED_OUTPATIENT_CLINIC_OR_DEPARTMENT_OTHER): Payer: Self-pay | Admitting: *Deleted

## 2018-06-22 ENCOUNTER — Other Ambulatory Visit: Payer: Self-pay

## 2018-06-22 DIAGNOSIS — Z87891 Personal history of nicotine dependence: Secondary | ICD-10-CM | POA: Insufficient documentation

## 2018-06-22 DIAGNOSIS — Z79899 Other long term (current) drug therapy: Secondary | ICD-10-CM | POA: Insufficient documentation

## 2018-06-22 DIAGNOSIS — Z85828 Personal history of other malignant neoplasm of skin: Secondary | ICD-10-CM | POA: Diagnosis not present

## 2018-06-22 DIAGNOSIS — Z85118 Personal history of other malignant neoplasm of bronchus and lung: Secondary | ICD-10-CM | POA: Diagnosis not present

## 2018-06-22 DIAGNOSIS — R42 Dizziness and giddiness: Secondary | ICD-10-CM | POA: Diagnosis present

## 2018-06-22 DIAGNOSIS — I10 Essential (primary) hypertension: Secondary | ICD-10-CM

## 2018-06-22 DIAGNOSIS — I2510 Atherosclerotic heart disease of native coronary artery without angina pectoris: Secondary | ICD-10-CM | POA: Insufficient documentation

## 2018-06-22 DIAGNOSIS — Z7982 Long term (current) use of aspirin: Secondary | ICD-10-CM | POA: Diagnosis not present

## 2018-06-22 DIAGNOSIS — Z9861 Coronary angioplasty status: Secondary | ICD-10-CM | POA: Diagnosis not present

## 2018-06-22 LAB — CBC WITH DIFFERENTIAL/PLATELET
ABS IMMATURE GRANULOCYTES: 0.03 10*3/uL (ref 0.00–0.07)
BASOS ABS: 0 10*3/uL (ref 0.0–0.1)
Basophils Relative: 1 %
Eosinophils Absolute: 0.5 10*3/uL (ref 0.0–0.5)
Eosinophils Relative: 7 %
HCT: 41.9 % (ref 39.0–52.0)
HEMOGLOBIN: 13.4 g/dL (ref 13.0–17.0)
Immature Granulocytes: 1 %
Lymphocytes Relative: 25 %
Lymphs Abs: 1.7 10*3/uL (ref 0.7–4.0)
MCH: 30.7 pg (ref 26.0–34.0)
MCHC: 32 g/dL (ref 30.0–36.0)
MCV: 96.1 fL (ref 80.0–100.0)
Monocytes Absolute: 0.6 10*3/uL (ref 0.1–1.0)
Monocytes Relative: 9 %
NEUTROS ABS: 3.8 10*3/uL (ref 1.7–7.7)
NRBC: 0 % (ref 0.0–0.2)
Neutrophils Relative %: 57 %
PLATELETS: 195 10*3/uL (ref 150–400)
RBC: 4.36 MIL/uL (ref 4.22–5.81)
RDW: 15.3 % (ref 11.5–15.5)
WBC: 6.6 10*3/uL (ref 4.0–10.5)

## 2018-06-22 LAB — COMPREHENSIVE METABOLIC PANEL
ALBUMIN: 3.8 g/dL (ref 3.5–5.0)
ALK PHOS: 98 U/L (ref 38–126)
ALT: 17 U/L (ref 0–44)
ANION GAP: 7 (ref 5–15)
AST: 31 U/L (ref 15–41)
BUN: 19 mg/dL (ref 8–23)
CHLORIDE: 104 mmol/L (ref 98–111)
CO2: 26 mmol/L (ref 22–32)
Calcium: 8.4 mg/dL — ABNORMAL LOW (ref 8.9–10.3)
Creatinine, Ser: 1.25 mg/dL — ABNORMAL HIGH (ref 0.61–1.24)
GFR calc Af Amer: >60 mL/min (ref >60)
GFR calc non Af Amer: 53 mL/min — ABNORMAL LOW (ref >60)
GLUCOSE: 117 mg/dL — AB (ref 70–99)
Potassium: 3.7 mmol/L (ref 3.5–5.1)
SODIUM: 137 mmol/L (ref 135–145)
TOTAL PROTEIN: 6.3 g/dL — AB (ref 6.5–8.1)
Total Bilirubin: 0.7 mg/dL (ref 0.3–1.2)

## 2018-06-22 NOTE — ED Notes (Signed)
ED Provider at bedside. 

## 2018-06-22 NOTE — ED Notes (Signed)
Patient transported to CT 

## 2018-06-22 NOTE — ED Provider Notes (Signed)
[facility] Provider Note   CSN: [id number] Arrival date & time: 06/22/18  1928     History   Chief Complaint Chief Complaint  Patient presents with  . Dizziness    HPI Jeremy Deleon is a 84 y.o. male.  Had a history of feeling lightheaded so he took his blood pressure which was elevated so he came here for further evaluation.  No numbness, weakness, chest pain, back pain, shortness of breath, vision changes or lower extremity swelling.   Dizziness  Quality:  Lightheadedness Severity:  Moderate Onset quality:  Sudden Timing:  Constant Progression:  Resolved Chronicity:  Recurrent Context: standing up   Relieved by:  None tried Worsened by:  Nothing Ineffective treatments:  None tried   Past Medical History:  Diagnosis Date  . BPH (benign prostatic hyperplasia)   . CAD S/P percutaneous coronary angioplasty cardiologist-- dr Ellyn Hack   07-25-2004 Abnormal Myoview with inferolateral ischemia: --> per cardiac cath 09-16-2004 -- 80% prox LAD lesion PCI: Taxus DES 3.0 mm x 16 mm-;; Myoview June 2009 no ischemia or infarction with attenuation artifact  . Chronic constipation   . Dyslipidemia, goal LDL below 70    on simvastatin and tricor  . Emphysema of lung ([place])   . Full dentures   . GERD (gastroesophageal reflux disease)   . Graves' disease 11/ 2016 dx   endocrinologist-  dr Renato Shin-- treated with tapazole until 2018 when labs were normal  . History of basal cell carcinoma (BCC) excision   . Hypertension   . Left hydrocele   . Mild atherosclerosis of both carotid arteries    duplex 09-09-2016  1-39% bilateral ICA  . Osteoarthritis   . S/P drug eluting coronary stent placement 09/16/2004   x1  to prox. LAD  . Squamous cell lung cancer : Left lower lobe  oncologist-  dr Julien Nordmann---  per lov note in epic no recurrence   dx 01/ 2016-- s/p  Bronchoscopy showed extrinsic compression of the left lower lobe with endobronchial biopsies were  negative. He subsequently had a needle biopsy which dx the mass as squamous cell carcinoma /  08-10-2014  s/p  Left VATS w/ LLlobectomy with node dissections and completed chemotherapy (11/2014)  . Wears glasses   . Wears hearing aid in both ears     Patient Active Problem List   Diagnosis Date Noted  . Swelling of lower leg 09/14/2017  . Retinal artery plaque 09/01/2016  . Hyperthyroidism 07/26/2015  . Full code status 04/23/2015  . Orthostatic hypotension; with baseline hypotension 11/13/2014  . Cellulitis 10/17/2014  . Chemotherapy induced neutropenia ([place]) 10/17/2014  . S/P lobectomy of lung 08/10/2014  . Squamous cell lung cancer : Left lower lobe  08/03/2014  . Dyslipidemia, goal LDL below 70 10/25/2013  . CAD S/P percutaneous coronary angioplasty -- Taxus DES in Prox LAD 10/25/2013  . Essential hypertension     Past Surgical History:  Procedure Laterality Date  . APPENDECTOMY  child  . CATARACT EXTRACTION W/ INTRAOCULAR LENS  IMPLANT, BILATERAL  2016 & 2017  . HYDROCELE EXCISION Left 03/12/2018   Procedure: HYDROCELECTOMY ADULT;  Surgeon: Ardis Hughs, MD;  Location: [facility];  Service: Urology;  Laterality: Left;  . NM MYOVIEW LTD  January 06, 2008    treadmill  myoview - tissue attenuation but no ischemia or infarction  . PERCUTANEOUS CORONARY STENT INTERVENTION (PCI-S)  2/27/ 2006    dr Tami Ribas  @[facility]    (07-25-2004 positive  cardiolite for ischemia)  balloon angioplasty and TAXUS DES 3.0 mm x 16 mm stent ->  PROX  LAD (80%)  . TRANSTHORACIC ECHOCARDIOGRAM  09-16-2016  limited w/ bubble study   ef 60- 65%, GR 1 DD./ Negative bubble study/  mild AR/  mild dilated ascending aorta/ No regional wall motion abnormality  . UMBILICAL HERNIA REPAIR  11-23-2007    dr Rise Patience   @[facility]   . VIDEO ASSISTED THORACOSCOPY (VATS)/ LOBECTOMY Left 08/10/2014   Procedure: VIDEO ASSISTED THORACOSCOPY (VATS)/ LOBECTOMY;  Surgeon: Ivin Poot, MD;  Location: [place];   Service: Thoracic;  Laterality: Left;  Marland Kitchen VIDEO BRONCHOSCOPY N/A 08/10/2014   Procedure: VIDEO BRONCHOSCOPY;  Surgeon: Ivin Poot, MD;  Location: [facility] OR;  Service: Thoracic;  Laterality: N/A;        Home Medications    Prior to Admission medications   Medication Sig Start Date End Date Taking? Authorizing Provider  aspirin EC 325 MG tablet Take 325 mg by mouth daily.    [provider]  atorvastatin (LIPITOR) 40 MG tablet Take 1 tablet (40 mg total) by mouth daily. Patient taking differently: Take 40 mg by mouth every morning.  09/14/17 03/09/18  Leonie Man, MD  Bisacodyl (LAXATIVE PO) Take by mouth as needed.    [provider]  fenofibrate (TRICOR) 145 MG tablet TAKE 1 TABLET(145 MG) BY MOUTH DAILY Patient taking differently: Take 145 mg by mouth every morning.  10/06/17   Leonie Man, MD  isosorbide mononitrate (IMDUR) 30 MG 24 hr tablet Take 1 tablet (30 mg total) by mouth daily. 04/23/18   Leonie Man, MD  traMADol (ULTRAM) 50 MG tablet Take 50 mg by mouth every 6 (six) hours as needed. 12/25/15   [provider]  traMADol (ULTRAM) 50 MG tablet Take 1-2 tablets (50-100 mg total) by mouth every 6 (six) hours as needed for moderate pain. 03/12/18   Ardis Hughs, MD    Family History Family History  Problem Relation Age of Onset  . Cancer Mother   . Heart disease Mother   . Heart disease Father   . Parkinsonism Brother   . Thyroid disease Neg Hx     Social History Social History   Tobacco Use  . Smoking status: Former Smoker    Years: 20.00    Types: Cigarettes    Last attempt to quit: 10/25/1982    Years since quitting: 35.6  . Smokeless tobacco: Never Used  Substance Use Topics  . Alcohol use: No  . Drug use: No     Allergies   Niaspan [niacin er]   Review of Systems Review of Systems  Neurological: Positive for dizziness.  All other systems reviewed and are negative.    Physical Exam Updated Vital Signs BP  (!) 151/74   Pulse 62   Temp 98.3 F (36.8 C) (Oral)   Resp (!) 22   Ht 6\' 1"  (1.854 m)   Wt 95.3 kg   SpO2 97%   BMI 27.71 kg/m   Physical Exam  Constitutional: He is oriented to person, place, and time. He appears well-developed and well-nourished.  HENT:  Head: Normocephalic and atraumatic.  Eyes: Conjunctivae and EOM are normal.  Neck: Normal range of motion.  Cardiovascular: Normal rate.  Pulmonary/Chest: Effort normal. No respiratory distress.  Abdominal: Soft. Bowel sounds are normal. He exhibits no distension.  Musculoskeletal: Normal range of motion.  Neurological: He is alert and oriented to person, place, and time. He displays  normal reflexes. No cranial nerve deficit or sensory deficit. He exhibits normal muscle tone. Coordination normal.  Skin: Skin is warm and dry.  Nursing note and vitals reviewed.    ED Treatments / Results  Labs (all labs ordered are listed, but only abnormal results are displayed) Labs Reviewed  COMPREHENSIVE METABOLIC PANEL - Abnormal; Notable for the following components:      Result Value   Glucose, Bld 117 (*)    Creatinine, Ser 1.25 (*)    Calcium 8.4 (*)    Total Protein 6.3 (*)    GFR calc non Af Amer 53 (*)    All other components within normal limits  CBC WITH DIFFERENTIAL/PLATELET    EKG EKG Interpretation  Date/Time:  Tuesday June 22 2018 19:38:22 EST Ventricular Rate:  67 PR Interval:    QRS Duration: 105 QT Interval:  426 QTC Calculation: 450 R Axis:   36 Text Interpretation:  Sinus rhythm Borderline prolonged PR interval No significant change since last tracing Confirmed by Merrily Pew [phone]) on 06/22/2018 7:43:17 PM   Radiology Ct Head Wo Contrast  Result Date: 06/22/2018 CLINICAL DATA:  Hypertension EXAM: CT HEAD WITHOUT CONTRAST TECHNIQUE: Contiguous axial images were obtained from the base of the skull through the vertex without intravenous contrast. COMPARISON:  MRI 08/03/2014 FINDINGS: Brain: There  is atrophy and chronic small vessel disease changes. No acute intracranial abnormality. Specifically, no hemorrhage, hydrocephalus, mass lesion, acute infarction, or significant intracranial injury. Vascular: No hyperdense vessel or unexpected calcification. Skull: No acute calvarial abnormality. Sinuses/Orbits: Mucosal thickening in the paranasal sinuses. No air-fluid levels. Other: No free fluid or free air. IMPRESSION: No acute intracranial abnormality. Atrophy, chronic microvascular disease. Electronically Signed   By: Rolm Baptise M.D.   On: 06/22/2018 20:18    Procedures Procedures (including critical care time)  Medications Ordered in ED Medications - No data to display   Initial Impression / Assessment and Plan / ED Course  I have reviewed the triage vital signs and the nursing notes.  Pertinent labs & imaging results that were available during my care of the patient were reviewed by me and considered in my medical decision making (see chart for details).     Unclear eetiology for symptpms. Still slightly hypertensive here but will follow up w/ pcp for further management. Doubt primary cardiac or neurologic cause at this time.   Final Clinical Impressions(s) / ED Diagnoses   Final diagnoses:  Dizziness  Hypertension, unspecified type    ED Discharge Orders    None       Felecia Stanfill, Corene Cornea, MD 06/22/18 2347

## 2018-06-22 NOTE — ED Triage Notes (Signed)
Dizziness, headache and elevated BP today.

## 2018-06-22 NOTE — ED Notes (Signed)
Pt on monitor 

## 2018-06-22 NOTE — ED Notes (Signed)
Pt states he felt dizzy earlier today and took his blood pressure which was elevated. Pt has a hx of HTN, denies numbness, denies weakness, no confusion, no slurred speech.

## 2018-07-12 ENCOUNTER — Other Ambulatory Visit: Payer: Self-pay | Admitting: Cardiology

## 2018-08-25 ENCOUNTER — Ambulatory Visit: Payer: Medicare Other | Admitting: Cardiology

## 2018-08-25 ENCOUNTER — Encounter: Payer: Self-pay | Admitting: Cardiology

## 2018-08-25 VITALS — BP 124/60 | HR 70 | Ht 73.0 in | Wt 214.2 lb

## 2018-08-25 DIAGNOSIS — H3509 Other intraretinal microvascular abnormalities: Secondary | ICD-10-CM

## 2018-08-25 DIAGNOSIS — E785 Hyperlipidemia, unspecified: Secondary | ICD-10-CM | POA: Diagnosis not present

## 2018-08-25 DIAGNOSIS — I2510 Atherosclerotic heart disease of native coronary artery without angina pectoris: Secondary | ICD-10-CM

## 2018-08-25 DIAGNOSIS — I951 Orthostatic hypotension: Secondary | ICD-10-CM | POA: Diagnosis not present

## 2018-08-25 DIAGNOSIS — I708 Atherosclerosis of other arteries: Secondary | ICD-10-CM

## 2018-08-25 DIAGNOSIS — Z9861 Coronary angioplasty status: Secondary | ICD-10-CM

## 2018-08-25 DIAGNOSIS — M7989 Other specified soft tissue disorders: Secondary | ICD-10-CM

## 2018-08-25 MED ORDER — ROSUVASTATIN CALCIUM 40 MG PO TABS: 40.0000 mg | ORAL_TABLET | Freq: Every day | ORAL | 3 refills | Status: DC

## 2018-08-25 NOTE — Progress Notes (Signed)
PCP: [name], MD  Clinic Note: Chief Complaint  Patient presents with  . Follow-up    Doing well  . Coronary Artery Disease    No angina    HPI: Jeremy Deleon is a 85 y.o. male with a a distant history of CAD-PCI (pLAD DES - 2006) as well as squamous cell lung cancer status post left-sided VATS & Mediastinal LN dissection and Dr. Prescott Gum). in January 2016.  Presents here today for annual follow-up.Jeremy Deleon was last seen In Feb 2019.  He was doing well.  No major complaints.  May be some exertional dyspnea if he overdid it.  Still working in his shop routinely.   --Was back on atorvastatin following a brief statin holiday. -- Mild lower extremity edema with stockings..  Recent Hospitalizations:   ER ([place]) - dizziness Dec 2019 --> noted to have elevated blood pressures.  Studies Personally Reviewed - (if available, images/films reviewed: From Epic Chart or Care Everywhere)  No new studies  Interval History: Jeremy Deleon presents today overall feeling pretty well.  He describes the episode back in December is stating that he started feeling weak and dizzy.  He just felt strange.  He did not necessarily feels that he was passed out.  He just really may be he was little bit dehydrated.  He was vomiting and nauseated.  There is resolved after he was able to fluids down. He says he has bilateral arm and shoulder discomfort the last five 15/22 at a time, but no real chest pain or pressure with rest or exertion.  He has baseline dyspnea but no change to that over the last several years.  No resting exertional chest pain or pressure. No PND, orthopnea but he does have some mild swelling. He does have some orthostatic dizziness, but is careful to hold onto something avoid falling when he first gets up.  He denies any rapid irregular heartbeats palpitations.  No syncope/near syncope or TIA/amaurosis fugax.  No claudication  ROS: A comprehensive was performed.  Pertinent symptoms noted above. Review of Systems  Constitutional: Negative for malaise/fatigue and weight loss.  HENT: Negative for nosebleeds.   Respiratory: Positive for shortness of breath (If he overdoes it).   Cardiovascular: Negative for chest pain and palpitations.  Gastrointestinal: Positive for abdominal pain and constipation. Negative for blood in stool and melena.  Genitourinary: Negative for hematuria.  Musculoskeletal: Positive for falls (In addition to the fall where he hurt his knee, just this past week or so he missed a step climbing up onto his porch.  And fell backwards on the grass.) and joint pain (He has quite a bit of pain in his left knee.).  Neurological: Positive for dizziness (Orthostatic). Negative for speech change and focal weakness.  Psychiatric/Behavioral: Negative for memory loss. The patient is not nervous/anxious and does not have insomnia.        Difficult to determine if he is describing symptoms as it dysthymia.  All other systems reviewed and are negative.  I have reviewed and (if needed) personally updated the patient's problem list, medications, allergies, past medical and surgical history, social and family history.   Past Medical History:  Diagnosis Date  . BPH (benign prostatic hyperplasia)   . CAD S/P percutaneous coronary angioplasty cardiologist-- dr Ellyn Hack   07-25-2004 Abnormal Myoview with inferolateral ischemia: --> per cardiac cath 09-16-2004 -- 80% prox LAD lesion PCI: Taxus DES 3.0 mm x 16 mm-;; Myoview June 2009 no  ischemia or infarction with attenuation artifact  . Chronic constipation   . Dyslipidemia, goal LDL below 70    on simvastatin and tricor  . Emphysema of lung ([place])   . Full dentures   . GERD (gastroesophageal reflux disease)   . Graves' disease 11/ 2016 dx   endocrinologist-  dr Renato Shin-- treated with tapazole until 2018 when labs were normal  . History of basal cell carcinoma (BCC) excision   . Hypertension   .  Left hydrocele   . Mild atherosclerosis of both carotid arteries    duplex 09-09-2016  1-39% bilateral ICA  . Osteoarthritis   . S/P drug eluting coronary stent placement 09/16/2004   x1  to prox. LAD  . Squamous cell lung cancer : Left lower lobe  oncologist-  dr Julien Nordmann---  per lov note in epic no recurrence   dx 01/ 2016-- s/p  Bronchoscopy showed extrinsic compression of the left lower lobe with endobronchial biopsies were negative. He subsequently had a needle biopsy which dx the mass as squamous cell carcinoma /  08-10-2014  s/p  Left VATS w/ LLlobectomy with node dissections and completed chemotherapy (11/2014)  . Wears glasses   . Wears hearing aid in both ears     Past Surgical History:  Procedure Laterality Date  . APPENDECTOMY  child  . CATARACT EXTRACTION W/ INTRAOCULAR LENS  IMPLANT, BILATERAL  2016 & 2017  . HYDROCELE EXCISION Left 03/12/2018   Procedure: HYDROCELECTOMY ADULT;  Surgeon: Ardis Hughs, MD;  Location[facility];  Service: Urology;  Laterality: Left;  . NM MYOVIEW LTD  January 06, 2008    treadmill  myoview - tissue attenuation but no ischemia or infarction  . PERCUTANEOUS CORONARY STENT INTERVENTION (PCI-S)  2/27/ 2006    dr Tami Ribas  @[facility]    (07-25-2004 positive cardiolite for ischemia)  balloon angioplasty and TAXUS DES 3.0 mm x 16 mm stent ->  PROX  LAD (80%)  . TRANSTHORACIC ECHOCARDIOGRAM  09-16-2016  limited w/ bubble study   ef 60- 65%, GR 1 DD./ Negative bubble study/  mild AR/  mild dilated ascending aorta/ No regional wall motion abnormality  . UMBILICAL HERNIA REPAIR  11-23-2007    dr Rise Patience   @[facility]   . VIDEO ASSISTED THORACOSCOPY (VATS)/ LOBECTOMY Left 08/10/2014   Procedure: VIDEO ASSISTED THORACOSCOPY (VATS)/ LOBECTOMY;  Surgeon: Ivin Poot, MD;  Location: [facility];  Service: Thoracic;  Laterality: Left;  Marland Kitchen VIDEO BRONCHOSCOPY N/A 08/10/2014   Procedure: VIDEO BRONCHOSCOPY;  Surgeon: Ivin Poot, MD;  Location: [facility] OR;   Service: Thoracic;  Laterality: N/A;    Current Meds  Medication Sig  . aspirin EC 325 MG tablet Take 325 mg by mouth daily.  . Bisacodyl (LAXATIVE PO) Take by mouth as needed.  . fenofibrate (TRICOR) 145 MG tablet TAKE 1 TABLET(145 MG) BY MOUTH DAILY  . isosorbide mononitrate (IMDUR) 30 MG 24 hr tablet Take 1 tablet (30 mg total) by mouth daily.  Marland Kitchen levothyroxine (SYNTHROID, LEVOTHROID) 50 MCG tablet Take 1 tablet by mouth daily.  . traMADol (ULTRAM) 50 MG tablet Take 1-2 tablets (50-100 mg total) by mouth every 6 (six) hours as needed for moderate pain.  . [DISCONTINUED] atorvastatin (LIPITOR) 40 MG tablet Take 1 tablet (40 mg total) by mouth daily.    Allergies  Allergen Reactions  . Niaspan [Niacin Er] Rash and Other (See Comments)    flushing    Social History   Socioeconomic History  . Marital status:  Single    Spouse name: Not on file  . Number of children: Not on file  . Years of education: Not on file  . Highest education level: Not on file  Occupational History  . Not on file  Social Needs  . Financial resource strain: Not on file  . Food insecurity:    Worry: Not on file    Inability: Not on file  . Transportation needs:    Medical: Not on file    Non-medical: Not on file  Tobacco Use  . Smoking status: Former Smoker    Years: 20.00    Types: Cigarettes    Last attempt to quit: 10/25/1982    Years since quitting: 35.8  . Smokeless tobacco: Never Used  Substance and Sexual Activity  . Alcohol use: No  . Drug use: No  . Sexual activity: Not on file  Lifestyle  . Physical activity:    Days per week: Not on file    Minutes per session: Not on file  . Stress: Not on file  Relationships  . Social connections:    Talks on phone: Not on file    Gets together: Not on file    Attends religious service: Not on file    Active member of club or organization: Not on file    Attends meetings of clubs or organizations: Not on file    Relationship status: Not on  file  Other Topics Concern  . Not on file  Social History Narrative    He is a widowed father of 105, grandfather of 9, still working. Does not smoke, quit 30   years ago and does not drink alcohol. He still works in his Nurse, learning disability for [employer].    family history includes Cancer in his mother; Heart disease in his father and mother; Parkinsonism in his brother.  Wt Readings from Last 3 Encounters:  08/25/18 214 lb 3.2 oz (97.2 kg)  06/22/18 210 lb (95.3 kg)  04/19/18 209 lb 14.4 oz (95.2 kg)    PHYSICAL EXAM BP 124/60   Pulse 70   Ht 6\' 1"  (1.854 m)   Wt 214 lb 3.2 oz (97.2 kg)   SpO2 96%   BMI 28.26 kg/m  Physical Exam  Constitutional: He is oriented to person, place, and time. He appears well-developed and well-nourished. No distress.  Well-groomed. Pleasant mood and affect  HENT:  Head: Normocephalic and atraumatic.  Wears glasses  Neck: Normal range of motion. Neck supple. No hepatojugular reflux and no JVD present. Carotid bruit is not present. No thyromegaly present.  Cardiovascular: Normal rate, regular rhythm and intact distal pulses. Exam reveals no gallop and no friction rub.  No murmur heard. Pulmonary/Chest: Effort normal. No respiratory distress. He has no wheezes. He has no rales.  Abdominal: Soft. Bowel sounds are normal. He exhibits no distension. There is no abdominal tenderness. There is no rebound.  Musculoskeletal: Normal range of motion.        General: No edema.  Neurological: He is alert and oriented to person, place, and time.  Skin: Skin is warm and dry.  Psychiatric: He has a normal mood and affect. His behavior is normal. Judgment and thought content normal.    Adult ECG Report n/a  Other studies Reviewed: Additional studies/ records that were reviewed today include:  Recent Labs:  n/a  Lab Results  Component Value Date   CHOL 164 09/08/2017   HDL 39 (L) 09/08/2017   LDLCALC 99 09/08/2017  TRIG 132 09/08/2017    CHOLHDL 4.2 09/08/2017   Lab Results  Component Value Date   CREATININE 1.25 (H) 06/22/2018   BUN 19 06/22/2018   NA 137 06/22/2018   K 3.7 06/22/2018   CL 104 06/22/2018   CO2 26 06/22/2018     ASSESSMENT / PLAN:   Axxel seems to be doing well overall.  No major cardiac complaints. Lipids not controlled with atorvastatin, will switch to Crestor. Continue to recommend support stockings.   Problem List Items Addressed This Visit    CAD S/P percutaneous coronary angioplasty -- Taxus DES in Prox LAD (Chronic)    14 years out from his PCI.  No anginal symptoms. He is on statin plus TriCor and Imdur. Has been on 325 mg of aspirin, presumably from his TIA.   We can reduce to 81 mg.      Relevant Medications   rosuvastatin (CRESTOR) 40 MG tablet   Dyslipidemia, goal LDL below 70 (Chronic)    Unfortunately, I think we lost the ground with him having held statin.  Lipids now show LDL of 99 on atorvastatin 40 mg.  Plan: Switch to Crestor/rosuvastatin 40 mg.   Will need to have recheck labs in another 6 months or so.      Relevant Medications   rosuvastatin (CRESTOR) 40 MG tablet   Orthostatic hypotension; with baseline hypotension (Chronic)    Blood pressure looks good here.  Interestingly he had some dizziness when his blood pressure went up before.  At this point I think I would avoid being too aggressive with any blood pressure treatment and therefore he is only on Imdur.      Relevant Medications   rosuvastatin (CRESTOR) 40 MG tablet   Retinal artery plaque - Primary    No recurrent symptoms.  On aspirin, reducing dose. On statin, changing to Crestor.  Fenofibrate.      Relevant Medications   rosuvastatin (CRESTOR) 40 MG tablet   Swelling of lower leg    Again, recommend support stockings as opposed to diuretic.         Current medicines are reviewed at length with the patient today. (+/- concerns) still notes fatigue, has stopped BB. The following changes have  been made: see below.  Patient Instructions  Medication Instructions:  COMPLETE THE BOTTLE  OF ATORVASTATIN , THEN START TAKING ROSUVASTATIN 40 MG  DAILY If you need a refill on your cardiac medications before your next appointment, please call your pharmacy.   Lab work: NOT NEEDED If you have labs (blood work) drawn today and your tests are completely normal, you will receive your results only by: Marland Kitchen MyChart Message (if you have MyChart) OR . A paper copy in the mail If you have any lab test that is abnormal or we need to change your treatment, we will call you to review the results.  Testing/Procedures: NOT NEEEDED  Follow-Up: At [facility], you and your health needs are our priority.  As part of our continuing mission to provide you with exceptional heart care, we have created designated Provider Care Teams.  These Care Teams include your primary Cardiologist (physician) and Advanced Practice Providers (APPs -  Physician Assistants and Nurse Practitioners) who all work together to provide you with the care you need, when you need it. You will need a follow up appointment in 12 months FEB 2021.  Please call our office 2 months in advance to schedule this appointment.  You may see DR Ellyn Hack or one  of the following Advanced Practice Providers on your designated Care Team:   Rosaria Ferries, PA-C . Jory Sims, DNP, ANP  Any Other Special Instructions Will Be Listed Below (If Applicable).      Studies Ordered:   No orders of the defined types were placed in this encounter.     Glenetta Hew, M.D., M.S. Interventional Cardiologist   Pager # [phone] Phone # [phone] [address] [place], [place] [address]

## 2018-08-25 NOTE — Patient Instructions (Signed)
Medication Instructions:  COMPLETE THE BOTTLE  OF ATORVASTATIN , THEN START TAKING ROSUVASTATIN 40 MG  DAILY If you need a refill on your cardiac medications before your next appointment, please call your pharmacy.   Lab work: NOT NEEDED If you have labs (blood work) drawn today and your tests are completely normal, you will receive your results only by: Marland Kitchen MyChart Message (if you have MyChart) OR . A paper copy in the mail If you have any lab test that is abnormal or we need to change your treatment, we will call you to review the results.  Testing/Procedures: NOT NEEEDED  Follow-Up: At [facility], you and your health needs are our priority.  As part of our continuing mission to provide you with exceptional heart care, we have created designated Provider Care Teams.  These Care Teams include your primary Cardiologist (physician) and Advanced Practice Providers (APPs -  Physician Assistants and Nurse Practitioners) who all work together to provide you with the care you need, when you need it. You will need a follow up appointment in 12 months FEB 2021.  Please call our office 2 months in advance to schedule this appointment.  You may see DR Ellyn Hack or one of the following Advanced Practice Providers on your designated Care Team:   Rosaria Ferries, PA-C . Jory Sims, DNP, ANP  Any Other Special Instructions Will Be Listed Below (If Applicable).

## 2018-08-28 ENCOUNTER — Encounter: Payer: Self-pay | Admitting: Cardiology

## 2018-08-28 NOTE — Assessment & Plan Note (Signed)
Blood pressure looks good here.  Interestingly he had some dizziness when his blood pressure went up before.  At this point I think I would avoid being too aggressive with any blood pressure treatment and therefore he is only on Imdur.

## 2018-08-28 NOTE — Assessment & Plan Note (Signed)
Again, recommend support stockings as opposed to diuretic.

## 2018-08-28 NOTE — Assessment & Plan Note (Signed)
No recurrent symptoms.  On aspirin, reducing dose. On statin, changing to Crestor.  Fenofibrate.

## 2018-08-28 NOTE — Assessment & Plan Note (Addendum)
Unfortunately, I think we lost the ground with him having held statin.  Lipids now show LDL of 99 on atorvastatin 40 mg.  Plan: Switch to Crestor/rosuvastatin 40 mg.   Will need to have recheck labs in another 6 months or so.

## 2018-08-28 NOTE — Assessment & Plan Note (Signed)
14 years out from his PCI.  No anginal symptoms. He is on statin plus TriCor and Imdur. Has been on 325 mg of aspirin, presumably from his TIA.   We can reduce to 81 mg.

## 2018-09-23 ENCOUNTER — Other Ambulatory Visit: Payer: Self-pay | Admitting: Endocrinology

## 2018-09-23 NOTE — Telephone Encounter (Signed)
Please refill x 1 Ov is due  

## 2018-09-24 ENCOUNTER — Other Ambulatory Visit: Payer: Self-pay | Admitting: Endocrinology

## 2018-10-08 ENCOUNTER — Telehealth: Payer: Self-pay | Admitting: *Deleted

## 2018-10-08 NOTE — Telephone Encounter (Signed)
Reviewed appt dates and times with patient over the phone

## 2018-10-13 ENCOUNTER — Telehealth: Payer: Self-pay | Admitting: Medical Oncology

## 2018-10-13 NOTE — Telephone Encounter (Signed)
Jeremy Deleon and pt  notified -per [facility] delay appts by one month. Schedule message sent.

## 2018-10-15 ENCOUNTER — Ambulatory Visit (HOSPITAL_COMMUNITY): Admission: RE | Admit: 2018-10-15 | Payer: Medicare Other | Source: Ambulatory Visit

## 2018-10-15 ENCOUNTER — Inpatient Hospital Stay: Payer: Medicare Other

## 2018-10-19 ENCOUNTER — Telehealth: Payer: Self-pay | Admitting: Internal Medicine

## 2018-10-19 ENCOUNTER — Ambulatory Visit: Payer: Medicare Other | Admitting: Internal Medicine

## 2018-10-19 NOTE — Telephone Encounter (Signed)
Scheduled appt per sch message 3/25 - left message for patient with appt date and time

## 2018-11-08 ENCOUNTER — Other Ambulatory Visit: Payer: Self-pay | Admitting: Cardiology

## 2018-11-08 ENCOUNTER — Telehealth: Payer: Self-pay | Admitting: Internal Medicine

## 2018-11-08 NOTE — Telephone Encounter (Signed)
Isosorbide MN 30 mg refilled. 

## 2018-11-08 NOTE — Telephone Encounter (Signed)
Called to inform patient that 4/29 appt will be via telephone and also to confirm time and location of lab on 4/27 before CT. Patient confirmed.

## 2018-11-15 ENCOUNTER — Ambulatory Visit (HOSPITAL_COMMUNITY): Admission: RE | Admit: 2018-11-15 | Payer: Medicare Other | Source: Ambulatory Visit

## 2018-11-15 ENCOUNTER — Inpatient Hospital Stay: Payer: Medicare Other | Attending: Internal Medicine

## 2018-11-15 ENCOUNTER — Other Ambulatory Visit: Payer: Self-pay

## 2018-11-15 ENCOUNTER — Other Ambulatory Visit: Payer: Medicare Other

## 2018-11-15 ENCOUNTER — Ambulatory Visit (HOSPITAL_COMMUNITY)
Admission: RE | Admit: 2018-11-15 | Discharge: 2018-11-15 | Disposition: A | Discharge status: Home / Self Care | Payer: Medicare Other | Source: Ambulatory Visit | Attending: Internal Medicine | Admitting: Internal Medicine

## 2018-11-15 ENCOUNTER — Encounter (HOSPITAL_COMMUNITY): Payer: Self-pay

## 2018-11-15 DIAGNOSIS — K8020 Calculus of gallbladder without cholecystitis without obstruction: Secondary | ICD-10-CM | POA: Diagnosis not present

## 2018-11-15 DIAGNOSIS — E785 Hyperlipidemia, unspecified: Secondary | ICD-10-CM | POA: Insufficient documentation

## 2018-11-15 DIAGNOSIS — K219 Gastro-esophageal reflux disease without esophagitis: Secondary | ICD-10-CM | POA: Diagnosis not present

## 2018-11-15 DIAGNOSIS — Z79890 Hormone replacement therapy (postmenopausal): Secondary | ICD-10-CM | POA: Diagnosis not present

## 2018-11-15 DIAGNOSIS — Z85828 Personal history of other malignant neoplasm of skin: Secondary | ICD-10-CM | POA: Insufficient documentation

## 2018-11-15 DIAGNOSIS — Z79899 Other long term (current) drug therapy: Secondary | ICD-10-CM | POA: Insufficient documentation

## 2018-11-15 DIAGNOSIS — C3490 Malignant neoplasm of unspecified part of unspecified bronchus or lung: Secondary | ICD-10-CM

## 2018-11-15 DIAGNOSIS — Z955 Presence of coronary angioplasty implant and graft: Secondary | ICD-10-CM | POA: Diagnosis not present

## 2018-11-15 DIAGNOSIS — Z902 Acquired absence of lung [part of]: Secondary | ICD-10-CM | POA: Insufficient documentation

## 2018-11-15 DIAGNOSIS — J90 Pleural effusion, not elsewhere classified: Secondary | ICD-10-CM | POA: Diagnosis not present

## 2018-11-15 DIAGNOSIS — J439 Emphysema, unspecified: Secondary | ICD-10-CM | POA: Diagnosis not present

## 2018-11-15 DIAGNOSIS — N400 Enlarged prostate without lower urinary tract symptoms: Secondary | ICD-10-CM | POA: Diagnosis not present

## 2018-11-15 DIAGNOSIS — I10 Essential (primary) hypertension: Secondary | ICD-10-CM | POA: Diagnosis not present

## 2018-11-15 DIAGNOSIS — E0500 Thyrotoxicosis with diffuse goiter without thyrotoxic crisis or storm: Secondary | ICD-10-CM | POA: Diagnosis not present

## 2018-11-15 DIAGNOSIS — Z7982 Long term (current) use of aspirin: Secondary | ICD-10-CM | POA: Insufficient documentation

## 2018-11-15 DIAGNOSIS — I2510 Atherosclerotic heart disease of native coronary artery without angina pectoris: Secondary | ICD-10-CM | POA: Diagnosis not present

## 2018-11-15 LAB — CMP (CANCER CENTER ONLY)
ALT: 12 U/L (ref 0–44)
AST: 25 U/L (ref 15–41)
Albumin: 3.9 g/dL (ref 3.5–5.0)
Alkaline Phosphatase: 91 U/L (ref 38–126)
Anion gap: 10 (ref 5–15)
BUN: 20 mg/dL (ref 8–23)
CO2: 23 mmol/L (ref 22–32)
Calcium: 8.8 mg/dL — ABNORMAL LOW (ref 8.9–10.3)
Chloride: 109 mmol/L (ref 98–111)
Creatinine: 1.16 mg/dL (ref 0.61–1.24)
GFR, Est AFR Am: >60 mL/min (ref >60)
GFR, Estimated: 58 mL/min — ABNORMAL LOW (ref >60)
Glucose, Bld: 86 mg/dL (ref 70–99)
Potassium: 3.9 mmol/L (ref 3.5–5.1)
Sodium: 142 mmol/L (ref 135–145)
Total Bilirubin: 0.6 mg/dL (ref 0.3–1.2)
Total Protein: 6.8 g/dL (ref 6.5–8.1)

## 2018-11-15 LAB — CBC WITH DIFFERENTIAL (CANCER CENTER ONLY)
Abs Immature Granulocytes: 0.02 10*3/uL (ref 0.00–0.07)
Basophils Absolute: 0 10*3/uL (ref 0.0–0.1)
Basophils Relative: 1 %
Eosinophils Absolute: 0.4 10*3/uL (ref 0.0–0.5)
Eosinophils Relative: 6 %
HCT: 45 % (ref 39.0–52.0)
Hemoglobin: 14.5 g/dL (ref 13.0–17.0)
Immature Granulocytes: 0 %
Lymphocytes Relative: 32 %
Lymphs Abs: 2 10*3/uL (ref 0.7–4.0)
MCH: 30.7 pg (ref 26.0–34.0)
MCHC: 32.2 g/dL (ref 30.0–36.0)
MCV: 95.3 fL (ref 80.0–100.0)
Monocytes Absolute: 0.5 10*3/uL (ref 0.1–1.0)
Monocytes Relative: 8 %
Neutro Abs: 3.4 10*3/uL (ref 1.7–7.7)
Neutrophils Relative %: 53 %
Platelet Count: 181 10*3/uL (ref 150–400)
RBC: 4.72 MIL/uL (ref 4.22–5.81)
RDW: 14.9 % (ref 11.5–15.5)
WBC Count: 6.4 10*3/uL (ref 4.0–10.5)
nRBC: 0 % (ref 0.0–0.2)

## 2018-11-15 MED ORDER — SODIUM CHLORIDE (PF) 0.9 % IJ SOLN
INTRAMUSCULAR | Status: AC
  Filled 2018-11-15: qty 50

## 2018-11-15 MED ORDER — IOHEXOL 300 MG/ML  SOLN
75.0000 mL | Freq: Once | INTRAMUSCULAR | Status: AC | PRN
  Administered 2018-11-15: 14:00:00 75 mL via INTRAVENOUS

## 2018-11-17 ENCOUNTER — Inpatient Hospital Stay (HOSPITAL_BASED_OUTPATIENT_CLINIC_OR_DEPARTMENT_OTHER): Payer: Medicare Other | Admitting: Internal Medicine

## 2018-11-17 ENCOUNTER — Encounter: Payer: Self-pay | Admitting: Internal Medicine

## 2018-11-17 DIAGNOSIS — I10 Essential (primary) hypertension: Secondary | ICD-10-CM

## 2018-11-17 DIAGNOSIS — C3492 Malignant neoplasm of unspecified part of left bronchus or lung: Secondary | ICD-10-CM | POA: Diagnosis not present

## 2018-11-17 DIAGNOSIS — Z79899 Other long term (current) drug therapy: Secondary | ICD-10-CM | POA: Diagnosis not present

## 2018-11-17 DIAGNOSIS — Z7982 Long term (current) use of aspirin: Secondary | ICD-10-CM | POA: Diagnosis not present

## 2018-11-17 DIAGNOSIS — C3490 Malignant neoplasm of unspecified part of unspecified bronchus or lung: Secondary | ICD-10-CM

## 2018-11-17 NOTE — Progress Notes (Signed)
[place] [phone]   [phone]  PROGRESS NOTE FOR TELEMEDICINE VISITS  [name], MD [place] [address]  I connected with@ on 11/17/18 at 10:00 AM EDT by telephone visit and verified that I am speaking with the correct person using two identifiers.   I discussed the limitations, risks, security and privacy concerns of performing an evaluation and management service by telemedicine and the availability of in-person appointments. I also discussed with the patient that there may be a patient responsible charge related to this service. The patient expressed understanding and agreed to proceed.  Other persons participating in the visit and their role in the encounter:  None  Patient's location: Home Provider's location: [place] [place] [place]  DIAGNOSIS: Stage IIA (T2b, N0, M0) non-small cell lung cancer consistent with squamous cell carcinoma diagnosed in January 2016  PRIOR THERAPY:  1) Status post left VATS with left lower lobectomy and mediastinal lymph node dissection under the care of Dr. Prescott Gum on 08/10/2014. 2) Adjuvant systemic chemotherapy with carboplatin for AUC of 5 on day 1 and gemcitabine 1000 MG/M2 on days 1 and 8 every 3 weeks. First dose 09/19/2014.  CURRENT THERAPY: Observation.  INTERVAL HISTORY: Jeremy Deleon 85 y.o. male has a telephone virtual visit with me today for evaluation and discussion of his scan results.  The patient is feeling fine today with no concerning complaints.  He denied having any chest pain but has shortness of breath with exertion because of lack of activity.  He has no cough or hemoptysis.  He denied having any headache or visual changes.  He has no nausea, vomiting, diarrhea or constipation.  He has no fever or chills.  The patient had repeat CT scan of the chest performed recently and is here for evaluation and discussion of his scan results.  MEDICAL  HISTORY: Past Medical History:  Diagnosis Date  . BPH (benign prostatic hyperplasia)   . CAD S/P percutaneous coronary angioplasty cardiologist-- dr Ellyn Hack   07-25-2004 Abnormal Myoview with inferolateral ischemia: --> per cardiac cath 09-16-2004 -- 80% prox LAD lesion PCI: Taxus DES 3.0 mm x 16 mm-;; Myoview June 2009 no ischemia or infarction with attenuation artifact  . Chronic constipation   . Dyslipidemia, goal LDL below 70    on simvastatin and tricor  . Emphysema of lung ([place])   . Full dentures   . GERD (gastroesophageal reflux disease)   . Graves' disease 11/ 2016 dx   endocrinologist-  dr Renato Shin-- treated with tapazole until 2018 when labs were normal  . History of basal cell carcinoma (BCC) excision   . Hypertension   . Left hydrocele   . Mild atherosclerosis of both carotid arteries    duplex 09-09-2016  1-39% bilateral ICA  . Osteoarthritis   . S/P drug eluting coronary stent placement 09/16/2004   x1  to prox. LAD  . Squamous cell lung cancer : Left lower lobe  oncologist-  dr Julien Nordmann---  per lov note in epic no recurrence   dx 01/ 2016-- s/p  Bronchoscopy showed extrinsic compression of the left lower lobe with endobronchial biopsies were negative. He subsequently had a needle biopsy which dx the mass as squamous cell carcinoma /  08-10-2014  s/p  Left VATS w/ LLlobectomy with node dissections and completed chemotherapy (11/2014)  . Wears glasses   . Wears hearing aid in both ears     ALLERGIES:  is allergic to niaspan [niacin  er].  MEDICATIONS:  Current Outpatient Medications  Medication Sig Dispense Refill  . aspirin EC 325 MG tablet Take 325 mg by mouth daily.    . Bisacodyl (LAXATIVE PO) Take by mouth as needed.    . fenofibrate (TRICOR) 145 MG tablet TAKE 1 TABLET(145 MG) BY MOUTH DAILY 90 tablet 2  . isosorbide mononitrate (IMDUR) 30 MG 24 hr tablet TAKE 1 TABLET(30 MG) BY MOUTH DAILY 90 tablet 1  . levothyroxine (SYNTHROID, LEVOTHROID) 50 MCG tablet  Take 1 tablet by mouth daily.    . methimazole (TAPAZOLE) 10 MG tablet TAKE 1 TABLET BY MOUTH TWICE DAILY 60 tablet 0  . rosuvastatin (CRESTOR) 40 MG tablet Take 1 tablet (40 mg total) by mouth daily. 90 tablet 3  . traMADol (ULTRAM) 50 MG tablet Take 1-2 tablets (50-100 mg total) by mouth every 6 (six) hours as needed for moderate pain. 15 tablet 0   No current facility-administered medications for this visit.     SURGICAL HISTORY:  Past Surgical History:  Procedure Laterality Date  . APPENDECTOMY  child  . CATARACT EXTRACTION W/ INTRAOCULAR LENS  IMPLANT, BILATERAL  2016 & 2017  . HYDROCELE EXCISION Left 03/12/2018   Procedure: HYDROCELECTOMY ADULT;  Surgeon: Ardis Hughs, MD;  Location: [facility];  Service: Urology;  Laterality: Left;  . NM MYOVIEW LTD  January 06, 2008    treadmill  myoview - tissue attenuation but no ischemia or infarction  . PERCUTANEOUS CORONARY STENT INTERVENTION (PCI-S)  2/27/ 2006    dr Tami Ribas  @[facility]    (07-25-2004 positive cardiolite for ischemia)  balloon angioplasty and TAXUS DES 3.0 mm x 16 mm stent ->  PROX  LAD (80%)  . TRANSTHORACIC ECHOCARDIOGRAM  09-16-2016  limited w/ bubble study   ef 60- 65%, GR 1 DD./ Negative bubble study/  mild AR/  mild dilated ascending aorta/ No regional wall motion abnormality  . UMBILICAL HERNIA REPAIR  11-23-2007    dr Rise Patience   @[facility]   . VIDEO ASSISTED THORACOSCOPY (VATS)/ LOBECTOMY Left 08/10/2014   Procedure: VIDEO ASSISTED THORACOSCOPY (VATS)/ LOBECTOMY;  Surgeon: Ivin Poot, MD;  Location: [place];  Service: Thoracic;  Laterality: Left;  Marland Kitchen VIDEO BRONCHOSCOPY N/A 08/10/2014   Procedure: VIDEO BRONCHOSCOPY;  Surgeon: Ivin Poot, MD;  Location: [facility];  Service: Thoracic;  Laterality: N/A;    REVIEW OF SYSTEMS:  A comprehensive review of systems was negative except for: Respiratory: positive for dyspnea on exertion   LABORATORY DATA: Lab Results  Component Value Date   WBC 6.4 11/15/2018    HGB 14.5 11/15/2018   HCT 45.0 11/15/2018   MCV 95.3 11/15/2018   PLT 181 11/15/2018      Chemistry      Component Value Date/Time   NA 142 11/15/2018 1209   NA 141 03/24/2017 1018   K 3.9 11/15/2018 1209   K 4.2 03/24/2017 1018   CL 109 11/15/2018 1209   CO2 23 11/15/2018 1209   CO2 25 03/24/2017 1018   BUN 20 11/15/2018 1209   BUN 16.5 03/24/2017 1018   CREATININE 1.16 11/15/2018 1209   CREATININE 1.2 03/24/2017 1018      Component Value Date/Time   CALCIUM 8.8 (L) 11/15/2018 1209   CALCIUM 9.6 03/24/2017 1018   ALKPHOS 91 11/15/2018 1209   ALKPHOS 87 03/24/2017 1018   AST 25 11/15/2018 1209   AST 30 03/24/2017 1018   ALT 12 11/15/2018 1209   ALT 17 03/24/2017 1018   BILITOT  0.6 11/15/2018 1209   BILITOT 0.56 03/24/2017 1018       RADIOGRAPHIC STUDIES: Ct Chest W Contrast  Result Date: 11/15/2018 CLINICAL DATA:  Non-small cell lung cancer staging EXAM: CT CHEST WITH CONTRAST TECHNIQUE: Multidetector CT imaging of the chest was performed during intravenous contrast administration. CONTRAST:  16mL OMNIPAQUE IOHEXOL 300 MG/ML  SOLN COMPARISON:  04/16/2018 FINDINGS: Cardiovascular: Coronary, aortic arch, and branch vessel atherosclerotic vascular disease. Mediastinum/Nodes: A right hilar lymph node measures 0.9 cm in short axis on image 83/2, stable. No overtly pathologic adenopathy in the chest. Lungs/Pleura: Left lower lobectomy. Chronic small left pleural effusion. Right apical pleuroparenchymal scarring. Centrilobular emphysema. Calcified granulomas are present more notable in the right lung. Several tiny noncalcified right upper lobe nodules are stable from prior exams. Posterior subpleural nodule in the left upper lobe measures 1.0 by 0.5 cm on image 104/7, roughly stable from 04/16/2018 and 10/01/2017, hence unlikely to be malignant. No new nodules are identified. Upper Abdomen: Gallstones noted. Scarring in the upper poles of the kidneys. Musculoskeletal: Old bilateral  rib deformities. Thoracic spondylosis. Bridging anterior spurring in the cervical spine. IMPRESSION: 1. Prior left lower lobectomy, without current findings of active malignancy. 2. There several small calcified and noncalcified nodules in the lungs which are unchanged. 3. Aortic Atherosclerosis (ICD10-I70.0) and Emphysema (ICD10-J43.9). Coronary atherosclerosis. 4. Cholelithiasis. 5. Small chronic left pleural effusion. Electronically Signed   By: Van Clines M.D.   On: 11/15/2018 19:51    ASSESSMENT AND PLAN: This is a very pleasant 85 years old white male with history of stage IIA non-small cell lung cancer, squamous cell carcinoma status post left lower lobectomy with lymph node dissection followed by adjuvant systemic chemotherapy with carboplatin and gemcitabine for 4 cycles. The patient has been observation since June 2016. The patient is doing fine today with no concerning complaints except for mild shortness of breath with exertion. He had repeat CT scan of the chest performed recently.  I personally and independently reviewed the scans and discussed the results with the patient today. His scan showed no concerning findings for disease recurrence or progression. I recommended for the patient to continue on observation with repeat CT scan of the chest in 6 months. He was advised to call immediately if he has any concerning symptoms in the interval. I discussed the assessment and treatment plan with the patient. The patient was provided an opportunity to ask questions and all were answered. The patient agreed with the plan and demonstrated an understanding of the instructions.   The patient was advised to call back or seek an in-person evaluation if the symptoms worsen or if the condition fails to improve as anticipated.  I provided 11 minutes of non face-to-face telephone visit time during this encounter, and > 50% was spent counseling as documented under my assessment & plan.  Eilleen Kempf, MD 11/17/2018 9:29 AM  Disclaimer: This note was dictated with voice recognition software. Similar sounding words can inadvertently be transcribed and may not be corrected upon review.

## 2018-11-18 ENCOUNTER — Telehealth: Payer: Self-pay | Admitting: Internal Medicine

## 2018-11-18 NOTE — Telephone Encounter (Signed)
Patient request mail

## 2018-11-26 ENCOUNTER — Other Ambulatory Visit: Payer: Self-pay | Admitting: Endocrinology

## 2018-12-26 IMAGING — CT CT CHEST W/ CM
2 of 4 series · 15 of 36 positions shown, 18 images · IV contrast (omnipaque)
Comparison: 10/01/2017

CLINICAL DATA: Left-sided lung cancer.

EXAM:
CT CHEST WITH CONTRAST
TECHNIQUE: Multidetector CT imaging of the chest was performed during
intravenous contrast administration.
CONTRAST:  75mL OMNIPAQUE IOHEXOL 300 MG/ML  SOLN

[Series 2: axial st · axial · 0.80mm/px · z∈[-113,+205]mm · 12 of 189 slices shown, 15 images]
[im 15/189  mediastinal]
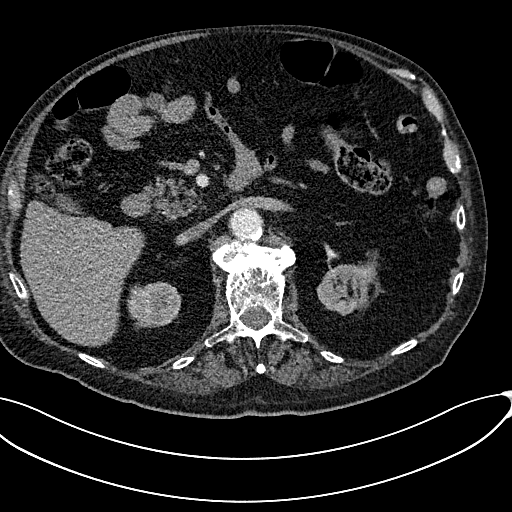
[im 15/189  lung]
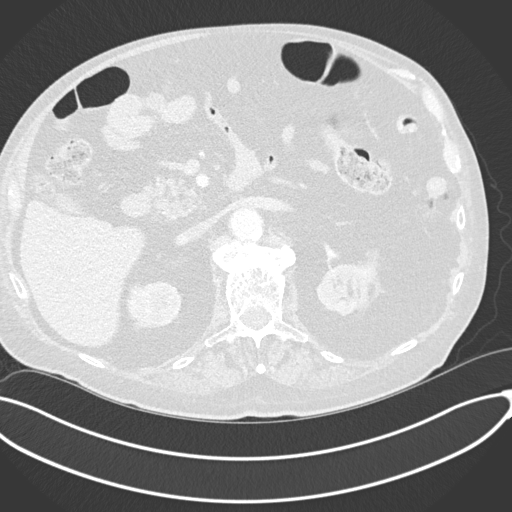
[im 29/189  lung]
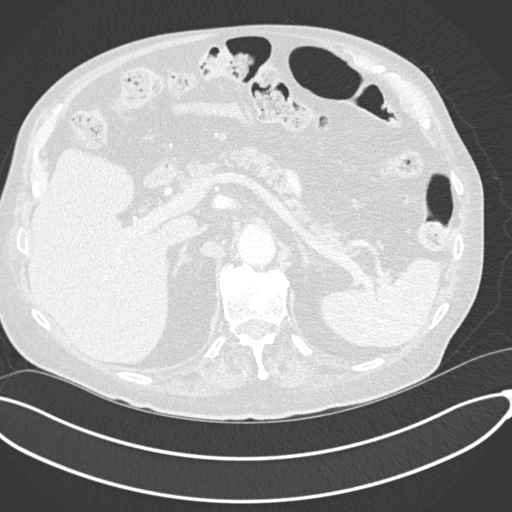
[im 44/189  lung]
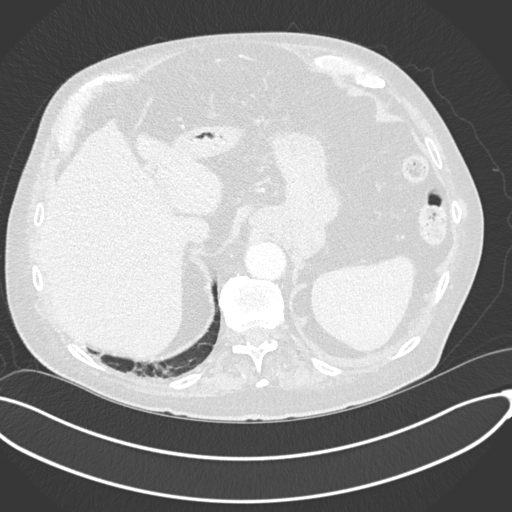
[im 58/189  lung]
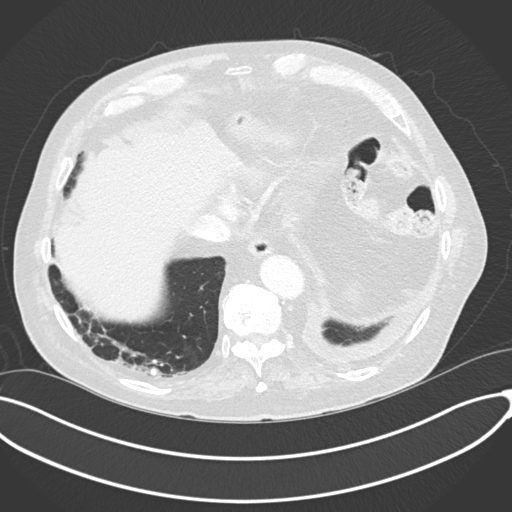
[im 73/189  mediastinal]
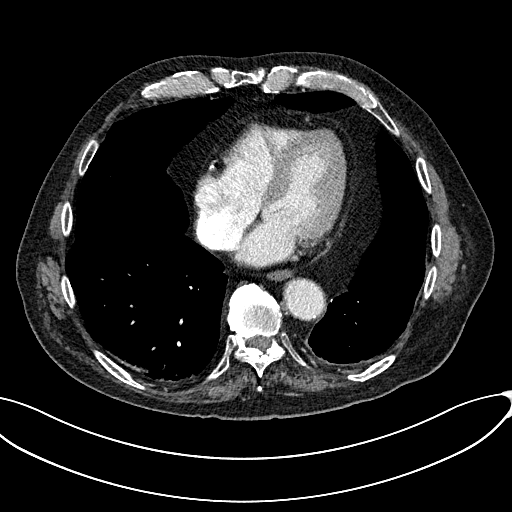
[im 73/189  lung]
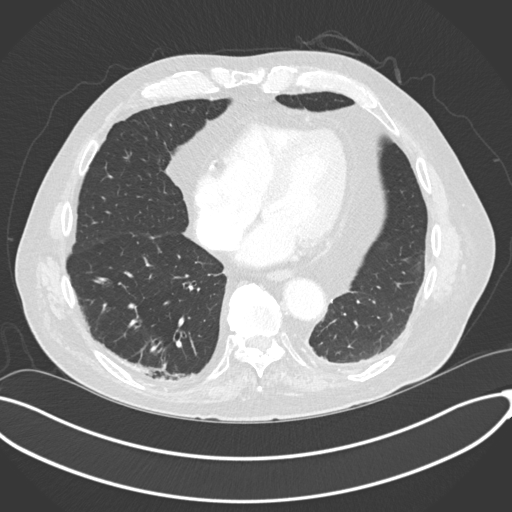
[im 87/189  lung]
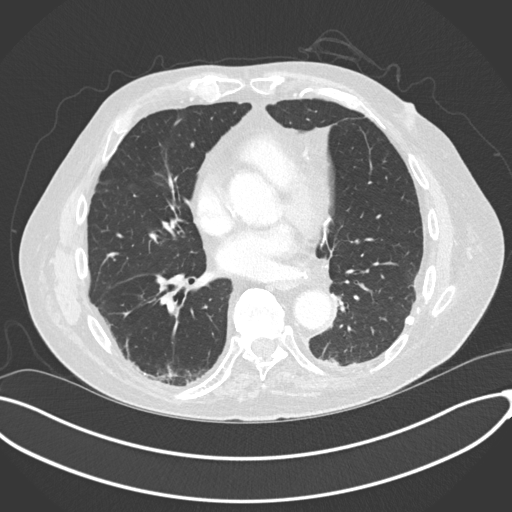
[im 102/189  lung]
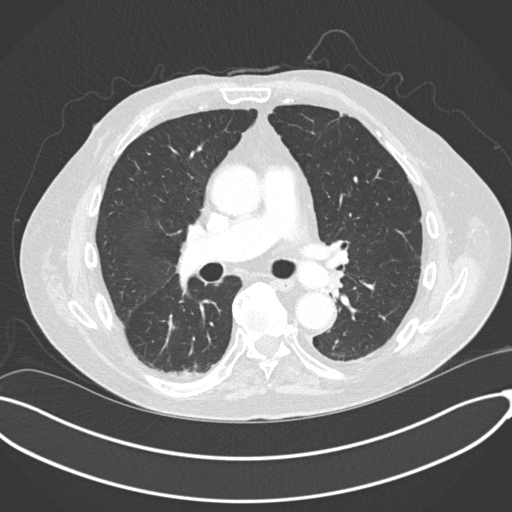
[im 116/189  lung]
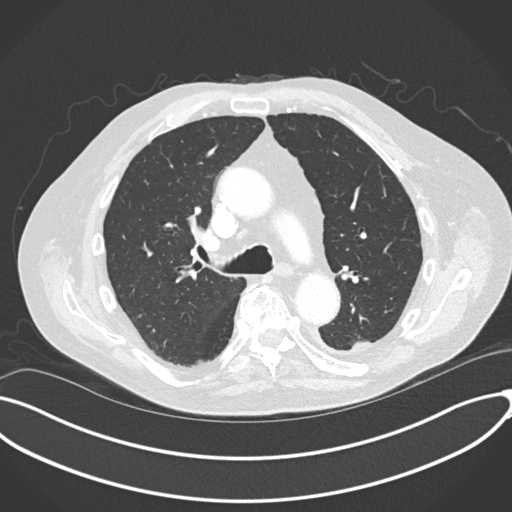
[im 131/189  mediastinal]
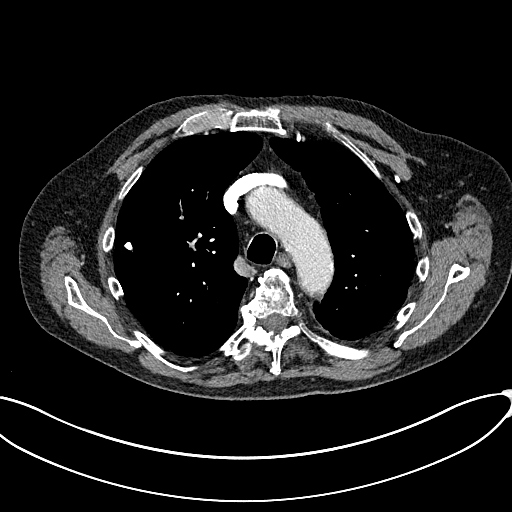
[im 131/189  lung]
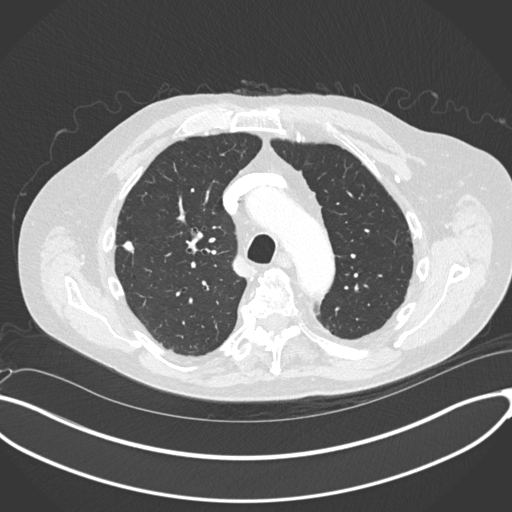
[im 145/189  lung]
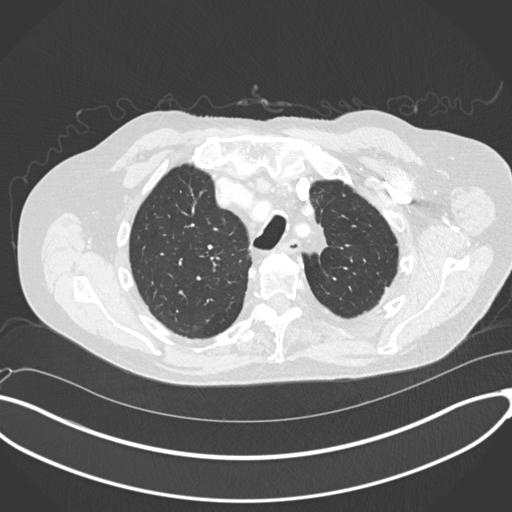
[im 160/189  lung]
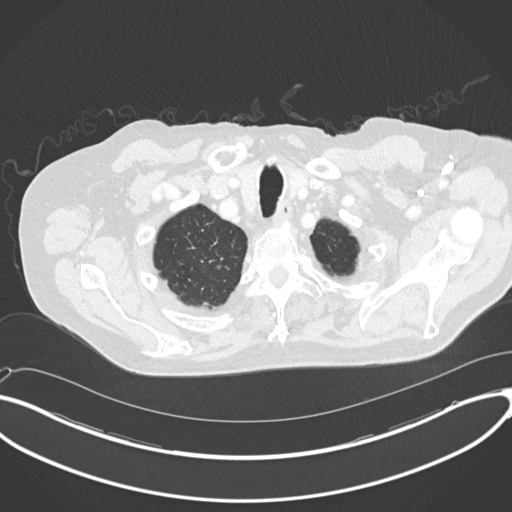
[im 174/189  lung]
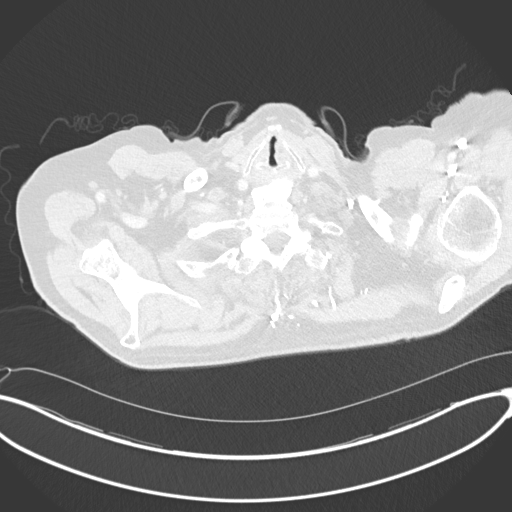

[Series 7: coronal · coronal · 0.84mm/px · 3 of 167 slices shown]
[im 34/167  lung]
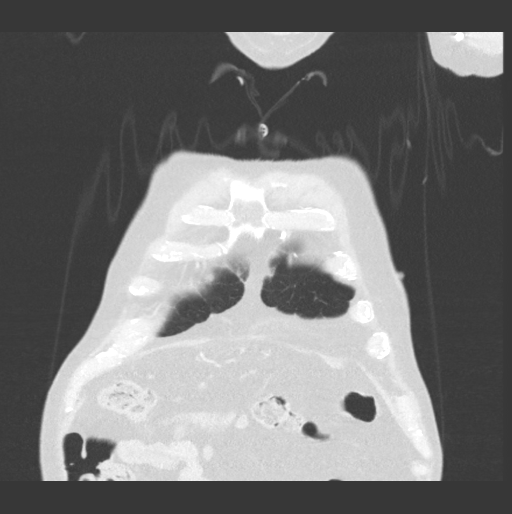
[im 67/167  lung]
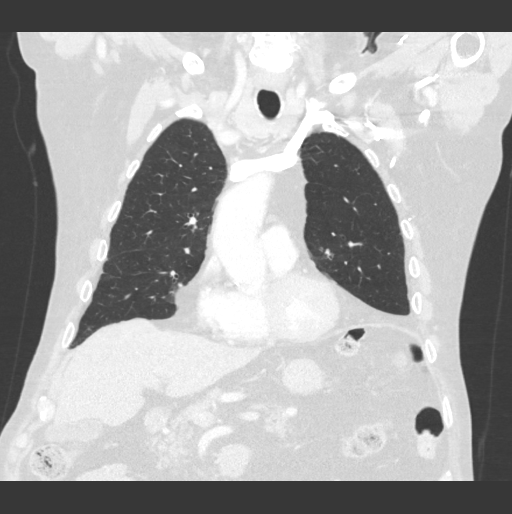
[im 100/167  lung]
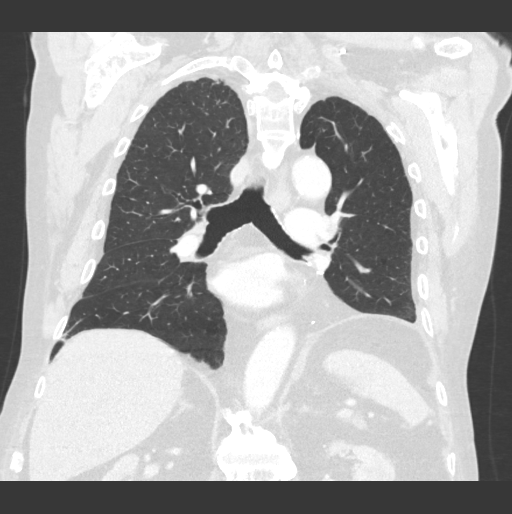

[15 of 36 positions shown; findings below may reference images not displayed]

FINDINGS: Cardiovascular: The heart size is normal. No substantial pericardial
effusion. Coronary artery calcification is evident. Atherosclerotic
calcification is noted in the wall of the thoracic aorta.

Mediastinum/Nodes: No mediastinal lymphadenopathy. There is no hilar
lymphadenopathy. The esophagus has normal imaging features. There is
no axillary lymphadenopathy.

Lungs/Pleura: Status post left lower lobectomy. The central
tracheobronchial airways are patent. Biapical pleuroparenchymal
scarring again noted. 4 mm posterior right apical nodule ([DATE]) is
stable. 4 mm peripheral left lung nodule (99/5) is unchanged. Tiny
chronic left pleural effusion is stable. Other scattered tiny
parenchymal and perifissural nodules are similar. Calcified
granulomas in the right lung again noted.

Upper Abdomen: Calcified gallstones evident.

Musculoskeletal: No worrisome lytic or sclerotic osseous
abnormality.
IMPRESSION: 1. Stable exam.  No new or progressive interval findings.
2. Status post left lower lobectomy.
3. Scattered bilateral tiny calcified and noncalcified pulmonary
nodules are unchanged in the interval.
4.  Aortic Atherosclerois ([id number])
5.  Emphysema. ([id number])
6. Cholelithiasis

## 2019-02-02 ENCOUNTER — Emergency Department (HOSPITAL_BASED_OUTPATIENT_CLINIC_OR_DEPARTMENT_OTHER)
Admission: EM | Admit: 2019-02-02 | Discharge: 2019-02-02 | Disposition: A | Discharge status: Home / Self Care | Payer: Medicare Other | Attending: Emergency Medicine | Admitting: Emergency Medicine

## 2019-02-02 ENCOUNTER — Emergency Department (HOSPITAL_BASED_OUTPATIENT_CLINIC_OR_DEPARTMENT_OTHER): Payer: Medicare Other

## 2019-02-02 ENCOUNTER — Other Ambulatory Visit: Payer: Self-pay

## 2019-02-02 ENCOUNTER — Encounter (HOSPITAL_BASED_OUTPATIENT_CLINIC_OR_DEPARTMENT_OTHER): Payer: Self-pay | Admitting: *Deleted

## 2019-02-02 DIAGNOSIS — W1839XA Other fall on same level, initial encounter: Secondary | ICD-10-CM | POA: Diagnosis not present

## 2019-02-02 DIAGNOSIS — S43102A Unspecified dislocation of left acromioclavicular joint, initial encounter: Secondary | ICD-10-CM | POA: Insufficient documentation

## 2019-02-02 DIAGNOSIS — Z7982 Long term (current) use of aspirin: Secondary | ICD-10-CM | POA: Insufficient documentation

## 2019-02-02 DIAGNOSIS — Y9389 Activity, other specified: Secondary | ICD-10-CM | POA: Insufficient documentation

## 2019-02-02 DIAGNOSIS — Z79899 Other long term (current) drug therapy: Secondary | ICD-10-CM | POA: Insufficient documentation

## 2019-02-02 DIAGNOSIS — Y999 Unspecified external cause status: Secondary | ICD-10-CM | POA: Diagnosis not present

## 2019-02-02 DIAGNOSIS — R55 Syncope and collapse: Secondary | ICD-10-CM

## 2019-02-02 DIAGNOSIS — I2510 Atherosclerotic heart disease of native coronary artery without angina pectoris: Secondary | ICD-10-CM | POA: Diagnosis not present

## 2019-02-02 DIAGNOSIS — Z85828 Personal history of other malignant neoplasm of skin: Secondary | ICD-10-CM | POA: Insufficient documentation

## 2019-02-02 DIAGNOSIS — Z85118 Personal history of other malignant neoplasm of bronchus and lung: Secondary | ICD-10-CM | POA: Insufficient documentation

## 2019-02-02 DIAGNOSIS — Z955 Presence of coronary angioplasty implant and graft: Secondary | ICD-10-CM | POA: Insufficient documentation

## 2019-02-02 DIAGNOSIS — E785 Hyperlipidemia, unspecified: Secondary | ICD-10-CM | POA: Diagnosis not present

## 2019-02-02 DIAGNOSIS — Z87891 Personal history of nicotine dependence: Secondary | ICD-10-CM | POA: Diagnosis not present

## 2019-02-02 DIAGNOSIS — Y92524 Gas station as the place of occurrence of the external cause: Secondary | ICD-10-CM | POA: Diagnosis not present

## 2019-02-02 DIAGNOSIS — I10 Essential (primary) hypertension: Secondary | ICD-10-CM | POA: Diagnosis not present

## 2019-02-02 DIAGNOSIS — S4992XA Unspecified injury of left shoulder and upper arm, initial encounter: Secondary | ICD-10-CM | POA: Diagnosis present

## 2019-02-02 LAB — CBC WITH DIFFERENTIAL/PLATELET
Abs Immature Granulocytes: 0.03 10*3/uL (ref 0.00–0.07)
Basophils Absolute: 0.1 10*3/uL (ref 0.0–0.1)
Basophils Relative: 1 %
Eosinophils Absolute: 0.4 10*3/uL (ref 0.0–0.5)
Eosinophils Relative: 5 %
HCT: 46.1 % (ref 39.0–52.0)
Hemoglobin: 14.8 g/dL (ref 13.0–17.0)
Immature Granulocytes: 0 %
Lymphocytes Relative: 28 %
Lymphs Abs: 2.3 10*3/uL (ref 0.7–4.0)
MCH: 30.5 pg (ref 26.0–34.0)
MCHC: 32.1 g/dL (ref 30.0–36.0)
MCV: 95.1 fL (ref 80.0–100.0)
Monocytes Absolute: 0.6 10*3/uL (ref 0.1–1.0)
Monocytes Relative: 8 %
Neutro Abs: 4.9 10*3/uL (ref 1.7–7.7)
Neutrophils Relative %: 58 %
Platelets: 164 10*3/uL (ref 150–400)
RBC: 4.85 MIL/uL (ref 4.22–5.81)
RDW: 14.2 % (ref 11.5–15.5)
WBC: 8.3 10*3/uL (ref 4.0–10.5)
nRBC: 0 % (ref 0.0–0.2)

## 2019-02-02 LAB — BASIC METABOLIC PANEL
Anion gap: 9 (ref 5–15)
BUN: 22 mg/dL (ref 8–23)
CO2: 23 mmol/L (ref 22–32)
Calcium: 8.9 mg/dL (ref 8.9–10.3)
Chloride: 108 mmol/L (ref 98–111)
Creatinine, Ser: 1.21 mg/dL (ref 0.61–1.24)
GFR calc Af Amer: >60 mL/min (ref >60)
GFR calc non Af Amer: 55 mL/min — ABNORMAL LOW (ref >60)
Glucose, Bld: 113 mg/dL — ABNORMAL HIGH (ref 70–99)
Potassium: 4.2 mmol/L (ref 3.5–5.1)
Sodium: 140 mmol/L (ref 135–145)

## 2019-02-02 LAB — CBG MONITORING, ED: Glucose-Capillary: 99 mg/dL (ref 70–99)

## 2019-02-02 NOTE — ED Provider Notes (Signed)
[facility] EMERGENCY DEPARTMENT Provider Note   CSN: [id number] Arrival date & time: 02/02/19  1759    History   Chief Complaint Chief Complaint  Patient presents with  . Loss of Consciousness    HPI Jeremy Deleon is a 85 y.o. male.     85yo male with PMH of non small cell lung cancer with left lower lobe resection, CAD with angioplasty and stent placement, HTN, dyslipidemia, for syncope. Patient states he was standing at his car pumping gas at the gas station, suddenly felt dizzy and passed out, striking his left shoulder on the ground. Patient states he was only out for a few seconds, got himself back up and drove home. Patient came to the ER due to pain in his left shoulder with deformity. Denies chest pain, headache, shortness of breath   Loss of Consciousness Associated symptoms: dizziness   Associated symptoms: no chest pain, no confusion, no fever, no nausea, no shortness of breath and no vomiting     Past Medical History:  Diagnosis Date  . BPH (benign prostatic hyperplasia)   . CAD S/P percutaneous coronary angioplasty cardiologist-- dr Ellyn Hack   07-25-2004 Abnormal Myoview with inferolateral ischemia: --> per cardiac cath 09-16-2004 -- 80% prox LAD lesion PCI: Taxus DES 3.0 mm x 16 mm-;; Myoview June 2009 no ischemia or infarction with attenuation artifact  . Chronic constipation   . Dyslipidemia, goal LDL below 70    on simvastatin and tricor  . Emphysema of lung ([place])   . Full dentures   . GERD (gastroesophageal reflux disease)   . Graves' disease 11/ 2016 dx   endocrinologist-  dr Renato Shin-- treated with tapazole until 2018 when labs were normal  . History of basal cell carcinoma (BCC) excision   . Hypertension   . Left hydrocele   . Mild atherosclerosis of both carotid arteries    duplex 09-09-2016  1-39% bilateral ICA  . Osteoarthritis   . S/P drug eluting coronary stent placement 09/16/2004   x1  to prox. LAD  . Squamous cell lung cancer :  Left lower lobe  oncologist-  dr Julien Nordmann---  per lov note in epic no recurrence   dx 01/ 2016-- s/p  Bronchoscopy showed extrinsic compression of the left lower lobe with endobronchial biopsies were negative. He subsequently had a needle biopsy which dx the mass as squamous cell carcinoma /  08-10-2014  s/p  Left VATS w/ LLlobectomy with node dissections and completed chemotherapy (11/2014)  . Wears glasses   . Wears hearing aid in both ears     Patient Active Problem List   Diagnosis Date Noted  . Swelling of lower leg 09/14/2017  . Retinal artery plaque 09/01/2016  . Hyperthyroidism 07/26/2015  . Full code status 04/23/2015  . Orthostatic hypotension; with baseline hypotension 11/13/2014  . Cellulitis 10/17/2014  . Chemotherapy induced neutropenia ([place]) 10/17/2014  . S/P lobectomy of lung 08/10/2014  . Squamous cell lung cancer : Left lower lobe  08/03/2014  . Dyslipidemia, goal LDL below 70 10/25/2013  . CAD S/P percutaneous coronary angioplasty -- Taxus DES in Prox LAD 10/25/2013  . Essential hypertension     Past Surgical History:  Procedure Laterality Date  . APPENDECTOMY  child  . CATARACT EXTRACTION W/ INTRAOCULAR LENS  IMPLANT, BILATERAL  2016 & 2017  . HYDROCELE EXCISION Left 03/12/2018   Procedure: HYDROCELECTOMY ADULT;  Surgeon: Ardis Hughs, MD;  Location: [facility];  Service: Urology;  Laterality: Left;  .  NM MYOVIEW LTD  January 06, 2008    treadmill  myoview - tissue attenuation but no ischemia or infarction  . PERCUTANEOUS CORONARY STENT INTERVENTION (PCI-S)  2/27/ 2006    dr Tami Ribas  @[facility]    (07-25-2004 positive cardiolite for ischemia)  balloon angioplasty and TAXUS DES 3.0 mm x 16 mm stent ->  PROX  LAD (80%)  . TRANSTHORACIC ECHOCARDIOGRAM  09-16-2016  limited w/ bubble study   ef 60- 65%, GR 1 DD./ Negative bubble study/  mild AR/  mild dilated ascending aorta/ No regional wall motion abnormality  . UMBILICAL HERNIA REPAIR  11-23-2007    dr  Rise Patience   @[facility]   . VIDEO ASSISTED THORACOSCOPY (VATS)/ LOBECTOMY Left 08/10/2014   Procedure: VIDEO ASSISTED THORACOSCOPY (VATS)/ LOBECTOMY;  Surgeon: Ivin Poot, MD;  Location: [facility];  Service: Thoracic;  Laterality: Left;  Marland Kitchen VIDEO BRONCHOSCOPY N/A 08/10/2014   Procedure: VIDEO BRONCHOSCOPY;  Surgeon: Ivin Poot, MD;  Location: [facility] OR;  Service: Thoracic;  Laterality: N/A;        Home Medications    Prior to Admission medications   Medication Sig Start Date End Date Taking? Authorizing Provider  aspirin EC 325 MG tablet Take 325 mg by mouth daily.    [provider]  Bisacodyl (LAXATIVE PO) Take by mouth as needed.    [provider]  fenofibrate (TRICOR) 145 MG tablet TAKE 1 TABLET(145 MG) BY MOUTH DAILY 07/12/18   Leonie Man, MD  isosorbide mononitrate (IMDUR) 30 MG 24 hr tablet TAKE 1 TABLET(30 MG) BY MOUTH DAILY 11/08/18   Leonie Man, MD  levothyroxine (SYNTHROID, LEVOTHROID) 50 MCG tablet Take 1 tablet by mouth daily. 06/28/18   [provider]  methimazole (TAPAZOLE) 10 MG tablet TAKE 1 TABLET BY MOUTH TWICE DAILY 09/24/18   Renato Shin, MD  rosuvastatin (CRESTOR) 40 MG tablet Take 1 tablet (40 mg total) by mouth daily. 08/25/18 11/23/18  Leonie Man, MD  traMADol (ULTRAM) 50 MG tablet Take 1-2 tablets (50-100 mg total) by mouth every 6 (six) hours as needed for moderate pain. 03/12/18   Ardis Hughs, MD    Family History Family History  Problem Relation Age of Onset  . Cancer Mother   . Heart disease Mother   . Heart disease Father   . Parkinsonism Brother   . Thyroid disease Neg Hx     Social History Social History   Tobacco Use  . Smoking status: Former Smoker    Years: 20.00    Types: Cigarettes    Quit date: 10/25/1982    Years since quitting: 36.2  . Smokeless tobacco: Never Used  Substance Use Topics  . Alcohol use: No  . Drug use: No     Allergies   Niaspan [niacin er]   Review of Systems Review  of Systems  Constitutional: Negative for chills and fever.  Eyes: Negative for visual disturbance.  Respiratory: Negative for shortness of breath.   Cardiovascular: Positive for syncope. Negative for chest pain.  Gastrointestinal: Negative for abdominal pain, constipation, diarrhea, nausea and vomiting.  Musculoskeletal: Positive for arthralgias and joint swelling. Negative for back pain, gait problem, myalgias, neck pain and neck stiffness.  Skin: Negative for rash and wound.  Allergic/Immunologic: Positive for immunocompromised state.  Neurological: Positive for dizziness and syncope.  Hematological: Does not bruise/bleed easily.  Psychiatric/Behavioral: Negative for confusion.  All other systems reviewed and are negative.    Physical Exam Updated Vital Signs BP 138/70  Pulse 70   Temp 97.9 F (36.6 C) (Oral)   Resp 17   Ht 6\' 1"  (1.854 m)   Wt 97.5 kg   SpO2 97%   BMI 28.37 kg/m   Physical Exam Vitals signs and nursing note reviewed.  Constitutional:      General: He is not in acute distress.    Appearance: He is well-developed. He is not diaphoretic.  HENT:     Head: Normocephalic and atraumatic.  Eyes:     Extraocular Movements: Extraocular movements intact.     Pupils: Pupils are equal, round, and reactive to light.  Neck:     Musculoskeletal: Neck supple. No muscular tenderness.  Cardiovascular:     Rate and Rhythm: Normal rate and regular rhythm.     Pulses: Normal pulses.     Heart sounds: Normal heart sounds.  Pulmonary:     Effort: Pulmonary effort is normal.     Breath sounds: Normal breath sounds.  Musculoskeletal:        General: Tenderness and signs of injury present. No swelling.     Left shoulder: He exhibits decreased range of motion, tenderness and bony tenderness.     Cervical back: Normal. He exhibits normal range of motion, no tenderness and no bony tenderness.     Thoracic back: He exhibits no tenderness and no bony tenderness.     Lumbar  back: He exhibits no tenderness and no bony tenderness.       Arms:  Skin:    General: Skin is warm and dry.     Findings: No erythema or rash.  Neurological:     General: No focal deficit present.     Mental Status: He is alert and oriented to person, place, and time.     GCS: GCS eye subscore is 4. GCS verbal subscore is 5. GCS motor subscore is 6.     Cranial Nerves: Cranial nerves are intact. No cranial nerve deficit, dysarthria or facial asymmetry.     Sensory: Sensation is intact.     Motor: Motor function is intact.     Gait: Gait is intact. Gait normal.  Psychiatric:        Behavior: Behavior normal.      ED Treatments / Results  Labs (all labs ordered are listed, but only abnormal results are displayed) Labs Reviewed  BASIC METABOLIC PANEL - Abnormal; Notable for the following components:      Result Value   Glucose, Bld 113 (*)    GFR calc non Af Amer 55 (*)    All other components within normal limits  CBC WITH DIFFERENTIAL/PLATELET  CBG MONITORING, ED    EKG EKG Interpretation  Date/Time:  Wednesday February 02 2019 18:20:10 EDT Ventricular Rate:  73 PR Interval:  184 QRS Duration: 94 QT Interval:  410 QTC Calculation: 451 R Axis:   9 Text Interpretation:  Normal sinus rhythm Normal ECG No significant change since last tracing Confirmed by Dorie Rank [phone]) on 02/02/2019 6:23:41 PM   Radiology Dg Chest 2 View  Result Date: 02/02/2019 CLINICAL DATA:  Syncopal episode while pumping gas lasting approximately 1 minute. Sudden onset of associated dizziness. EXAM: CHEST - 2 VIEW COMPARISON:  Chest CT, 11/15/2018.  Chest radiographs, 11/06/2017. FINDINGS: Cardiac silhouette is top-normal in size. No mediastinal or hilar masses or evidence of adenopathy. Stable nodular area of presumed scarring in the right peripheral upper lobe. Lungs otherwise clear. Elevated left hemidiaphragm, also unchanged. No pleural effusion or pneumothorax. Skeletal structures  are grossly  intact. IMPRESSION: No acute cardiopulmonary disease. Electronically Signed   By: Lajean Manes M.D.   On: 02/02/2019 20:13   Ct Head Wo Contrast  Result Date: 02/02/2019 CLINICAL DATA:  Syncope, fainting, cardiac etiology suspected EXAM: CT HEAD WITHOUT CONTRAST TECHNIQUE: Contiguous axial images were obtained from the base of the skull through the vertex without intravenous contrast. COMPARISON:  CT head 06/22/2018 FINDINGS: Brain: No evidence of acute infarction, hemorrhage, hydrocephalus, extra-axial collection or mass lesion/mass effect. Patchy areas of white matter hypoattenuation are most compatible with chronic microvascular angiopathy. Vascular: Atherosclerotic calcification of the carotid siphons. Skull: No calvarial fracture or suspicious osseous lesion. No scalp swelling or hematoma. Sinuses/Orbits: Minimal maxillary mural thickening. No air-fluid levels. Trace left mastoid effusion. Right mastoid air cells are well aerated. Orbital structures are unremarkable aside from prior lens extractions. Other: Denture plate is noted. IMPRESSION: No acute intracranial abnormality. Chronic microvascular ischemic changes. Intracranial atherosclerosis. Trace left mastoid effusion. Minimal maxillary sinus mural thickening. Electronically Signed   By: Lovena Le M.D.   On: 02/02/2019 20:01   Dg Shoulder Left  Result Date: 02/02/2019 CLINICAL DATA:  85 year old male with fall today, loss of consciousness, pain in left shoulder with decreased range of motion EXAM: LEFT SHOULDER - 2+ VIEW COMPARISON:  CT chest 11/15/2018 FINDINGS: There is no evidence of fracture or dislocation. Soft tissue mineralization at the level of the supraspinatus likely reflecting hydroxy appetite deposition. Mild glenohumeral and acromioclavicular arthrosis. Several remote left-sided rib fractures are present with sclerotic callus formation similar to prior CT. No acute or displaced rib fracture. Trace left effusion, similar in size to  prior CT. Remaining portions of the lung are clear. IMPRESSION: 1. No acute fracture or dislocation. 2. Calcific tendinosis. 3. Mild glenohumeral and acromioclavicular arthrosis. Electronically Signed   By: Lovena Le M.D.   On: 02/02/2019 19:00    Procedures Procedures (including critical care time)  Medications Ordered in ED Medications - No data to display   Initial Impression / Assessment and Plan / ED Course  I have reviewed the triage vital signs and the nursing notes.  Pertinent labs & imaging results that were available during my care of the patient were reviewed by me and considered in my medical decision making (see chart for details).  Clinical Course as of Feb 01 2057  Wed Feb 01, 5329  5432 85 year old male presents with complaint of left shoulder pain.  Patient states that he was putting gasoline in his car today at the gas station when he felt dizzy and passed out and hit his left shoulder on the ground.  Patient believes this happened because he was overheated.  Patient reports being unconscious for a few seconds before being able to stand back up on his own and drove his car back home.  Patient is here due to deformity at the left [facility] joint with left shoulder pain.  Neuro exam is unremarkable, patient is well-appearing, tenderness noted at left [facility] joint, suspect AC joint separation.  X-ray of the left shoulder is negative for dislocation or acute bony injury.  Recommend admission to the hospital for syncope for further monitoring and work-up.  Patient has declined at this time and would prefer to go home to follow-up with his primary care provider.  CT of the head without acute findings, chest x-ray unremarkable, EKG without acute ischemic changes.  Patient will recheck with PCP, will return to ER at any point for any concerning symptoms.   [LM]  Clinical Course User Index [LM] Tacy Learn, PA-C      Final Clinical Impressions(s) / ED Diagnoses   Final diagnoses:   Syncope, unspecified syncope type  Acromioclavicular joint separation, left, initial encounter    ED Discharge Orders    None       Roque Lias 02/02/19 2058    Dorie Rank, MD 02/03/19 [phone]

## 2019-02-02 NOTE — ED Triage Notes (Addendum)
Pt c/o syncopal episode while pumping gas lasting 1 min. C/o sudden onset of dizziness prior to, pt c/o left shoulder pain

## 2019-02-02 NOTE — Discharge Instructions (Addendum)
Recommend admission to the hospital for further monitoring and evaluation. Please follow up with your doctor tomorrow for recheck. Your left shoulder may have an acromioclavicular joint separation. This is where your collar bone meets the hook of the scapula. Recommend you wear a sling for comfort and to allow the joint to heal. Your doctor can follow this up at your next visit.  Apply ice to area for 20 minutes at a time if needed for pain. Take Tylenol as needed as directed for pain.

## 2019-02-02 NOTE — ED Notes (Signed)
D/c instructions reviewed with pt and pt son. Instructions include sling care- how to get dressed/undressed/ pain management. Pt son informed of pt declining admission and this RN expressed importance of follow up with PCP. Pt lives alone and son reports he will stay with him tonight and check on him throughout this week. No concerns or questions at d/c.

## 2019-02-02 NOTE — ED Notes (Signed)
Pt lying down for 17mins for orthostatics

## 2019-02-13 DIAGNOSIS — R55 Syncope and collapse: Secondary | ICD-10-CM | POA: Insufficient documentation

## 2019-04-11 ENCOUNTER — Other Ambulatory Visit: Payer: Self-pay | Admitting: Cardiology

## 2019-05-13 ENCOUNTER — Telehealth: Payer: Self-pay | Admitting: Cardiology

## 2019-05-13 NOTE — Telephone Encounter (Signed)
Spoke with pt, in July he passed out pumping gas and they never found anything wrong. He reports about every other day he has dizziness to the point he has to sit down. It can occur with standing from sitting and after walking around. His bp is good and does not change when he checks it at home. Sitting down and resting is the only thing that makes the dizziness better and turning his head will make it worse. He reports the room spins around. Explained to patient that it sounds like vertigo. He will try claritin OTC and if no improvement will contact his medical doctor. Follow up scheduled per recall.

## 2019-05-13 NOTE — Telephone Encounter (Signed)
STAT if patient feels like he/she is going to faint   1) Are you dizzy now? no  2) Do you feel faint or have you passed out? no  3) Do you have any other symptoms? weakness  4) Have you checked your HR and BP (record if available)? Yes BP 133/72

## 2019-05-17 ENCOUNTER — Inpatient Hospital Stay: Payer: Medicare Other | Attending: Internal Medicine

## 2019-05-17 ENCOUNTER — Other Ambulatory Visit: Payer: Self-pay

## 2019-05-17 ENCOUNTER — Ambulatory Visit (HOSPITAL_COMMUNITY)
Admission: RE | Admit: 2019-05-17 | Discharge: 2019-05-17 | Disposition: A | Discharge status: Home / Self Care | Payer: Medicare Other | Source: Ambulatory Visit | Attending: Internal Medicine | Admitting: Internal Medicine

## 2019-05-17 DIAGNOSIS — Z79899 Other long term (current) drug therapy: Secondary | ICD-10-CM | POA: Insufficient documentation

## 2019-05-17 DIAGNOSIS — C3432 Malignant neoplasm of lower lobe, left bronchus or lung: Secondary | ICD-10-CM | POA: Diagnosis present

## 2019-05-17 DIAGNOSIS — I10 Essential (primary) hypertension: Secondary | ICD-10-CM | POA: Insufficient documentation

## 2019-05-17 DIAGNOSIS — C3490 Malignant neoplasm of unspecified part of unspecified bronchus or lung: Secondary | ICD-10-CM | POA: Diagnosis present

## 2019-05-17 DIAGNOSIS — C3492 Malignant neoplasm of unspecified part of left bronchus or lung: Secondary | ICD-10-CM

## 2019-05-17 LAB — CBC WITH DIFFERENTIAL (CANCER CENTER ONLY)
Abs Immature Granulocytes: 0.01 10*3/uL (ref 0.00–0.07)
Basophils Absolute: 0.1 10*3/uL (ref 0.0–0.1)
Basophils Relative: 1 %
Eosinophils Absolute: 0.5 10*3/uL (ref 0.0–0.5)
Eosinophils Relative: 8 %
HCT: 43.6 % (ref 39.0–52.0)
Hemoglobin: 14.3 g/dL (ref 13.0–17.0)
Immature Granulocytes: 0 %
Lymphocytes Relative: 37 %
Lymphs Abs: 2.4 10*3/uL (ref 0.7–4.0)
MCH: 30.8 pg (ref 26.0–34.0)
MCHC: 32.8 g/dL (ref 30.0–36.0)
MCV: 93.8 fL (ref 80.0–100.0)
Monocytes Absolute: 0.5 10*3/uL (ref 0.1–1.0)
Monocytes Relative: 8 %
Neutro Abs: 3 10*3/uL (ref 1.7–7.7)
Neutrophils Relative %: 46 %
Platelet Count: 188 10*3/uL (ref 150–400)
RBC: 4.65 MIL/uL (ref 4.22–5.81)
RDW: 14.6 % (ref 11.5–15.5)
WBC Count: 6.5 10*3/uL (ref 4.0–10.5)
nRBC: 0 % (ref 0.0–0.2)

## 2019-05-17 LAB — CMP (CANCER CENTER ONLY)
ALT: 14 U/L (ref 0–44)
AST: 29 U/L (ref 15–41)
Albumin: 3.7 g/dL (ref 3.5–5.0)
Alkaline Phosphatase: 87 U/L (ref 38–126)
Anion gap: 8 (ref 5–15)
BUN: 17 mg/dL (ref 8–23)
CO2: 27 mmol/L (ref 22–32)
Calcium: 9 mg/dL (ref 8.9–10.3)
Chloride: 108 mmol/L (ref 98–111)
Creatinine: 1.15 mg/dL (ref 0.61–1.24)
GFR, Est AFR Am: >60 mL/min (ref >60)
GFR, Estimated: 58 mL/min — ABNORMAL LOW (ref >60)
Glucose, Bld: 75 mg/dL (ref 70–99)
Potassium: 4.5 mmol/L (ref 3.5–5.1)
Sodium: 143 mmol/L (ref 135–145)
Total Bilirubin: 0.5 mg/dL (ref 0.3–1.2)
Total Protein: 6.4 g/dL — ABNORMAL LOW (ref 6.5–8.1)

## 2019-05-17 MED ORDER — SODIUM CHLORIDE (PF) 0.9 % IJ SOLN
INTRAMUSCULAR | Status: AC
  Filled 2019-05-17: qty 50

## 2019-05-17 MED ORDER — IOHEXOL 300 MG/ML  SOLN
75.0000 mL | Freq: Once | INTRAMUSCULAR | Status: AC | PRN
  Administered 2019-05-17: 75 mL via INTRAVENOUS

## 2019-05-19 ENCOUNTER — Telehealth: Payer: Self-pay | Admitting: Internal Medicine

## 2019-05-19 ENCOUNTER — Other Ambulatory Visit: Payer: Self-pay

## 2019-05-19 ENCOUNTER — Encounter: Payer: Self-pay | Admitting: Internal Medicine

## 2019-05-19 ENCOUNTER — Inpatient Hospital Stay (HOSPITAL_BASED_OUTPATIENT_CLINIC_OR_DEPARTMENT_OTHER): Payer: Medicare Other | Admitting: Internal Medicine

## 2019-05-19 DIAGNOSIS — C3432 Malignant neoplasm of lower lobe, left bronchus or lung: Secondary | ICD-10-CM | POA: Diagnosis not present

## 2019-05-19 DIAGNOSIS — C3490 Malignant neoplasm of unspecified part of unspecified bronchus or lung: Secondary | ICD-10-CM

## 2019-05-19 NOTE — Telephone Encounter (Signed)
Scheduled per 10/29 los, patient received after visit summary and calender.

## 2019-05-19 NOTE — Progress Notes (Signed)
Pt unable to reconcile meds.

## 2019-05-19 NOTE — Progress Notes (Signed)
[place] [phone]   [phone]  OFFICE PROGRESS NOTE  [name], Shyrl Numbers, MD [place] [address]  DIAGNOSIS: Stage IIA (T2b, N0, M0) non-small cell lung cancer consistent with squamous cell carcinoma diagnosed in January 2016  PRIOR THERAPY:  1) Status post left VATS with left lower lobectomy and mediastinal lymph node dissection under the care of Dr. Prescott Gum on 08/10/2014. 2) Adjuvant systemic chemotherapy with carboplatin for AUC of 5 on day 1 and gemcitabine 1000 MG/M2 on days 1 and 8 every 3 weeks. First dose 09/19/2014.  CURRENT THERAPY: Observation.  INTERVAL HISTORY: Jeremy Deleon 85 y.o. male returns to the clinic today for 6 months follow-up visit.  The patient is feeling fine today with no concerning complaints.  He denied having any current chest pain, shortness of breath, cough or hemoptysis.  He has generalized fatigue.  He has a fall in July 2020 and imaging studies showed no acute fracture or dislocation.  The patient denied having any nausea, vomiting, diarrhea or constipation.  He denied having any headache or visual changes.  He has no fever or chills.  He had repeat CT scan of the chest performed recently and he is here for evaluation and discussion of his scan results.   MEDICAL HISTORY: Past Medical History:  Diagnosis Date  . BPH (benign prostatic hyperplasia)   . CAD S/P percutaneous coronary angioplasty cardiologist-- dr Ellyn Hack   07-25-2004 Abnormal Myoview with inferolateral ischemia: --> per cardiac cath 09-16-2004 -- 80% prox LAD lesion PCI: Taxus DES 3.0 mm x 16 mm-;; Myoview June 2009 no ischemia or infarction with attenuation artifact  . Chronic constipation   . Dyslipidemia, goal LDL below 70    on simvastatin and tricor  . Emphysema of lung ([place])   . Full dentures   . GERD (gastroesophageal reflux disease)   . Graves' disease 11/ 2016 dx   endocrinologist-  dr Renato Shin-- treated with tapazole until 2018 when labs were normal  . History of basal cell carcinoma (BCC) excision   . Hypertension   . Left hydrocele   . Mild atherosclerosis of both carotid arteries    duplex 09-09-2016  1-39% bilateral ICA  . Osteoarthritis   . S/P drug eluting coronary stent placement 09/16/2004   x1  to prox. LAD  . Squamous cell lung cancer : Left lower lobe  oncologist-  dr Julien Nordmann---  per lov note in epic no recurrence   dx 01/ 2016-- s/p  Bronchoscopy showed extrinsic compression of the left lower lobe with endobronchial biopsies were negative. He subsequently had a needle biopsy which dx the mass as squamous cell carcinoma /  08-10-2014  s/p  Left VATS w/ LLlobectomy with node dissections and completed chemotherapy (11/2014)  . Wears glasses   . Wears hearing aid in both ears     ALLERGIES:  is allergic to niaspan [niacin er].  MEDICATIONS:  Current Outpatient Medications  Medication Sig Dispense Refill  . aspirin EC 325 MG tablet Take 325 mg by mouth daily.    . Bisacodyl (LAXATIVE PO) Take by mouth as needed.    . fenofibrate (TRICOR) 145 MG tablet TAKE 1 TABLET(145 MG) BY MOUTH DAILY 90 tablet 2  . isosorbide mononitrate (IMDUR) 30 MG 24 hr tablet TAKE 1 TABLET(30 MG) BY MOUTH DAILY 90 tablet 1  . levothyroxine (SYNTHROID, LEVOTHROID) 50 MCG tablet Take 1 tablet by mouth daily.    Marland Kitchen  methimazole (TAPAZOLE) 10 MG tablet TAKE 1 TABLET BY MOUTH TWICE DAILY 60 tablet 0  . rosuvastatin (CRESTOR) 40 MG tablet Take 1 tablet (40 mg total) by mouth daily. 90 tablet 3  . traMADol (ULTRAM) 50 MG tablet Take 1-2 tablets (50-100 mg total) by mouth every 6 (six) hours as needed for moderate pain. 15 tablet 0   No current facility-administered medications for this visit.     SURGICAL HISTORY:  Past Surgical History:  Procedure Laterality Date  . APPENDECTOMY  child  . CATARACT EXTRACTION W/ INTRAOCULAR LENS  IMPLANT, BILATERAL  2016 & 2017  . HYDROCELE EXCISION Left  03/12/2018   Procedure: HYDROCELECTOMY ADULT;  Surgeon: Ardis Hughs, MD;  Location: [facility];  Service: Urology;  Laterality: Left;  . NM MYOVIEW LTD  January 06, 2008    treadmill  myoview - tissue attenuation but no ischemia or infarction  . PERCUTANEOUS CORONARY STENT INTERVENTION (PCI-S)  2/27/ 2006    dr Tami Ribas  @[facility]    (07-25-2004 positive cardiolite for ischemia)  balloon angioplasty and TAXUS DES 3.0 mm x 16 mm stent ->  PROX  LAD (80%)  . TRANSTHORACIC ECHOCARDIOGRAM  09-16-2016  limited w/ bubble study   ef 60- 65%, GR 1 DD./ Negative bubble study/  mild AR/  mild dilated ascending aorta/ No regional wall motion abnormality  . UMBILICAL HERNIA REPAIR  11-23-2007    dr Rise Patience   @[facility]   . VIDEO ASSISTED THORACOSCOPY (VATS)/ LOBECTOMY Left 08/10/2014   Procedure: VIDEO ASSISTED THORACOSCOPY (VATS)/ LOBECTOMY;  Surgeon: Ivin Poot, MD;  Location: [facility];  Service: Thoracic;  Laterality: Left;  Marland Kitchen VIDEO BRONCHOSCOPY N/A 08/10/2014   Procedure: VIDEO BRONCHOSCOPY;  Surgeon: Ivin Poot, MD;  Location[facility];  Service: Thoracic;  Laterality: N/A;    REVIEW OF SYSTEMS:  A comprehensive review of systems was negative except for: Constitutional: positive for fatigue   PHYSICAL EXAMINATION: General appearance: alert, cooperative, fatigued and no distress Head: Normocephalic, without obvious abnormality, atraumatic Neck: no adenopathy, no JVD, supple, symmetrical, trachea midline and thyroid not enlarged, symmetric, no tenderness/mass/nodules Lymph nodes: Cervical, supraclavicular, and axillary nodes normal. Resp: clear to auscultation bilaterally Back: symmetric, no curvature. ROM normal. No CVA tenderness. Cardio: normal apical impulse GI: soft, non-tender; bowel sounds normal; no masses,  no organomegaly Extremities: extremities normal, atraumatic, no cyanosis or edema  ECOG PERFORMANCE STATUS: 1 - Symptomatic but completely ambulatory  Blood pressure  137/71, pulse 84, temperature 97.8 F (36.6 C), temperature source Temporal, resp. rate 17, height 6\' 1"  (1.854 m), weight 206 lb 8 oz (93.7 kg), SpO2 97 %.  LABORATORY DATA: Lab Results  Component Value Date   WBC 6.5 05/17/2019   HGB 14.3 05/17/2019   HCT 43.6 05/17/2019   MCV 93.8 05/17/2019   PLT 188 05/17/2019      Chemistry      Component Value Date/Time   NA 143 05/17/2019 1027   NA 141 03/24/2017 1018   K 4.5 05/17/2019 1027   K 4.2 03/24/2017 1018   CL 108 05/17/2019 1027   CO2 27 05/17/2019 1027   CO2 25 03/24/2017 1018   BUN 17 05/17/2019 1027   BUN 16.5 03/24/2017 1018   CREATININE 1.15 05/17/2019 1027   CREATININE 1.2 03/24/2017 1018      Component Value Date/Time   CALCIUM 9.0 05/17/2019 1027   CALCIUM 9.6 03/24/2017 1018   ALKPHOS 87 05/17/2019 1027   ALKPHOS 87 03/24/2017 1018   AST 29  05/17/2019 1027   AST 30 03/24/2017 1018   ALT 14 05/17/2019 1027   ALT 17 03/24/2017 1018   BILITOT 0.5 05/17/2019 1027   BILITOT 0.56 03/24/2017 1018       RADIOGRAPHIC STUDIES: Ct Chest W Contrast  Result Date: 05/17/2019 CLINICAL DATA:  Stage IIA left lower lobe squamous cell lung cancer status post left lower lobectomy 08/10/2014 with adjuvant chemotherapy. Restaging. Interval observation. EXAM: CT CHEST WITH CONTRAST TECHNIQUE: Multidetector CT imaging of the chest was performed during intravenous contrast administration. CONTRAST:  39mL OMNIPAQUE IOHEXOL 300 MG/ML  SOLN COMPARISON:  11/15/2018 chest CT. FINDINGS: Cardiovascular: Normal heart size. No significant pericardial effusion/thickening. Three-vessel coronary atherosclerosis. Atherosclerotic nonaneurysmal thoracic aorta. Normal caliber main pulmonary artery. No central pulmonary emboli. Mediastinum/Nodes: No discrete thyroid nodules. Unremarkable esophagus. No pathologically enlarged axillary, mediastinal or hilar lymph nodes. Lungs/Pleura: No pneumothorax. Status post left lower lobectomy. Chronic trace  dependent basilar left pleural effusion is stable. No new pleural thickening or nodularity. No right pleural effusion. No acute consolidative airspace disease or lung masses. Scattered calcified subcentimeter granulomas in the right lung are stable. Apical right upper lobe 4 mm solid pulmonary nodule (series 7/image 27) and posterior left upper lobe 6 mm solid subpleural pulmonary nodule (series 7/image 101) are stable. No new significant pulmonary nodules. Nonspecific patchy reticulation and ground-glass attenuation in the dependent right lower lobe, potentially hypoventilatory. Mild centrilobular emphysema. Upper abdomen: Small hiatal hernia.  Cholelithiasis. Musculoskeletal: No aggressive appearing focal osseous lesions. Marked thoracic spondylosis. IMPRESSION: 1. No evidence of local tumor recurrence status post left lower lobectomy. Stable trace basilar left pleural effusion. 2. No findings suspicious for metastatic disease in the chest. Small bilateral pulmonary nodules remain stable. Aortic Atherosclerosis (ICD10-I70.0) and Emphysema (ICD10-J43.9). Electronically Signed   By: Ilona Sorrel M.D.   On: 05/17/2019 15:47    ASSESSMENT AND PLAN:  This is a very pleasant 85 years old white male with history of stage IIA non-small cell lung cancer, squamous cell carcinoma status post left lower lobectomy with lymph node dissection followed by adjuvant systemic chemotherapy with carboplatin and gemcitabine for 4 cycles. The patient has been observation since June 2016. The patient is doing fine today with no concerning complaints except for fatigue. He had repeat CT scan of the chest performed recently.  I personally and independently reviewed the scans and discussed the results with the patient today. His a scan showed no concerning findings for disease recurrence or progression. I recommended for him to continue on observation with repeat CT scan of the chest in 6 months. The patient was advised to call  immediately if he has any concerning symptoms in the interval. The patient voices understanding of current disease status and treatment options and is in agreement with the current care plan. All questions were answered. The patient knows to call the clinic with any problems, questions or concerns. We can certainly see the patient much sooner if necessary. I spent 10 minutes counseling the patient face to face. The total time spent in the appointment was 15 minutes.  Disclaimer: This note was dictated with voice recognition software. Similar sounding words can inadvertently be transcribed and may not be corrected upon review.

## 2019-05-20 ENCOUNTER — Ambulatory Visit (INDEPENDENT_AMBULATORY_CARE_PROVIDER_SITE_OTHER): Payer: Medicare Other | Admitting: Orthopedic Surgery

## 2019-05-20 ENCOUNTER — Encounter: Payer: Self-pay | Admitting: Orthopedic Surgery

## 2019-05-20 ENCOUNTER — Ambulatory Visit: Payer: Self-pay

## 2019-05-20 VITALS — Ht 74.0 in | Wt 205.0 lb

## 2019-05-20 DIAGNOSIS — G8929 Other chronic pain: Secondary | ICD-10-CM | POA: Diagnosis not present

## 2019-05-20 DIAGNOSIS — M25512 Pain in left shoulder: Secondary | ICD-10-CM | POA: Diagnosis not present

## 2019-05-20 NOTE — Progress Notes (Signed)
Office Visit Note   Patient: Jeremy Deleon           Date of Birth: 1933/07/03           MRN: [id number] Visit Date: 05/20/2019 Requested by: Jeremy Salinas, MD [address] [place],  [place] [address] PCP: Jeremy Salinas, MD  Subjective: Chief Complaint  Patient presents with  . Left Shoulder - Injury    DOI 02/02/2019    HPI: Jeremy Deleon is an 85 year old patient who fell 02/02/2019 landed on his left side.  Reports pain in the posterior shoulder and some in that left chest region anteriorly.  Pain comes and goes.  Pain is worse in the evening.  He is right-hand dominant.  Denies any weakness or any numbness or tingling in that left arm.  He has seen another orthopedic surgeon who recommended no treatment.  He is not taking anything for pain.              ROS: All systems reviewed are negative as they relate to the chief complaint within the history of present illness.  Patient denies  fevers or chills.   Assessment & Plan: Visit Diagnoses:  1. Chronic left shoulder pain     Plan: Impression is relatively asymptomatic grade 5 AC separation now 59 months old.  I think some of this chest pain business he is having is potentially cardiac related and he is seeing his cardiologist in the near future.  Regarding the shoulder he has forward flexion abduction both above 90 degrees and good rotator cuff strength.  No intervention required.  Follow-up as needed  Follow-Up Instructions: Return if symptoms worsen or fail to improve.   Orders:  Orders Placed This Encounter  Procedures  . XR Shoulder Left   No orders of the defined types were placed in this encounter.     Procedures: No procedures performed   Clinical Data: No additional findings.  Objective: Vital Signs: Ht 6\' 2"  (1.88 m)   Wt 205 lb (93 kg)   BMI 26.32 kg/m   Physical Exam:   Constitutional: Patient appears well-developed HEENT:  Head: Normocephalic Eyes:EOM are normal Neck:  Normal range of motion Cardiovascular: Normal rate Pulmonary/chest: Effort normal Neurologic: Patient is alert Skin: Skin is warm Psychiatric: Patient has normal mood and affect    Ortho Exam: Ortho exam demonstrates forward flexion abduction of that left arm and right arm well above 90 degrees.  Rotator cuff strength in the left is good infraspinatus supraspinatus and subscap muscle testing.  Motor sensory function of the hand is intact.  Radial pulses intact.  No other masses lymphadenopathy or skin changes noted in that left shoulder region.  Specialty Comments:  No specialty comments available.  Imaging: Xr Shoulder Left  Result Date: 05/20/2019 AP outlet axillary left shoulder reviewed.  Grade 5 AC separation is present.  This is best seen on the outlet view.  No acute fracture.  Shoulder is located.  Visualized lung fields clear.    PMFS History: Patient Active Problem List   Diagnosis Date Noted  . Swelling of lower leg 09/14/2017  . Retinal artery plaque 09/01/2016  . Hyperthyroidism 07/26/2015  . Full code status 04/23/2015  . Orthostatic hypotension; with baseline hypotension 11/13/2014  . Cellulitis 10/17/2014  . Chemotherapy induced neutropenia ([place]) 10/17/2014  . S/P lobectomy of lung 08/10/2014  . Squamous cell lung cancer : Left lower lobe  08/03/2014  . Dyslipidemia, goal LDL below 70 10/25/2013  .  CAD S/P percutaneous coronary angioplasty -- Taxus DES in Prox LAD 10/25/2013  . Essential hypertension    Past Medical History:  Diagnosis Date  . BPH (benign prostatic hyperplasia)   . CAD S/P percutaneous coronary angioplasty cardiologist-- dr Ellyn Hack   07-25-2004 Abnormal Myoview with inferolateral ischemia: --> per cardiac cath 09-16-2004 -- 80% prox LAD lesion PCI: Taxus DES 3.0 mm x 16 mm-;; Myoview June 2009 no ischemia or infarction with attenuation artifact  . Chronic constipation   . Dyslipidemia, goal LDL below 70    on simvastatin and tricor  .  Emphysema of lung ([place])   . Full dentures   . GERD (gastroesophageal reflux disease)   . Graves' disease 11/ 2016 dx   endocrinologist-  dr Renato Shin-- treated with tapazole until 2018 when labs were normal  . History of basal cell carcinoma (BCC) excision   . Hypertension   . Left hydrocele   . Mild atherosclerosis of both carotid arteries    duplex 09-09-2016  1-39% bilateral ICA  . Osteoarthritis   . S/P drug eluting coronary stent placement 09/16/2004   x1  to prox. LAD  . Squamous cell lung cancer : Left lower lobe  oncologist-  dr Julien Nordmann---  per lov note in epic no recurrence   dx 01/ 2016-- s/p  Bronchoscopy showed extrinsic compression of the left lower lobe with endobronchial biopsies were negative. He subsequently had a needle biopsy which dx the mass as squamous cell carcinoma /  08-10-2014  s/p  Left VATS w/ LLlobectomy with node dissections and completed chemotherapy (11/2014)  . Wears glasses   . Wears hearing aid in both ears     Family History  Problem Relation Age of Onset  . Cancer Mother   . Heart disease Mother   . Heart disease Father   . Parkinsonism Brother   . Thyroid disease Neg Hx     Past Surgical History:  Procedure Laterality Date  . APPENDECTOMY  child  . CATARACT EXTRACTION W/ INTRAOCULAR LENS  IMPLANT, BILATERAL  2016 & 2017  . HYDROCELE EXCISION Left 03/12/2018   Procedure: HYDROCELECTOMY ADULT;  Surgeon: Ardis Hughs, MD;  Location[facility];  Service: Urology;  Laterality: Left;  . NM MYOVIEW LTD  January 06, 2008    treadmill  myoview - tissue attenuation but no ischemia or infarction  . PERCUTANEOUS CORONARY STENT INTERVENTION (PCI-S)  2/27/ 2006    dr Tami Ribas  @[facility]    (07-25-2004 positive cardiolite for ischemia)  balloon angioplasty and TAXUS DES 3.0 mm x 16 mm stent ->  PROX  LAD (80%)  . TRANSTHORACIC ECHOCARDIOGRAM  09-16-2016  limited w/ bubble study   ef 60- 65%, GR 1 DD./ Negative bubble study/  mild AR/  mild  dilated ascending aorta/ No regional wall motion abnormality  . UMBILICAL HERNIA REPAIR  11-23-2007    dr Rise Patience   @[facility]   . VIDEO ASSISTED THORACOSCOPY (VATS)/ LOBECTOMY Left 08/10/2014   Procedure: VIDEO ASSISTED THORACOSCOPY (VATS)/ LOBECTOMY;  Surgeon: Ivin Poot, MD;  Location: [facility];  Service: Thoracic;  Laterality: Left;  Marland Kitchen VIDEO BRONCHOSCOPY N/A 08/10/2014   Procedure: VIDEO BRONCHOSCOPY;  Surgeon: Ivin Poot, MD;  Location: [facility];  Service: Thoracic;  Laterality: N/A;   Social History   Occupational History  . Not on file  Tobacco Use  . Smoking status: Former Smoker    Years: 20.00    Types: Cigarettes    Quit date: 10/25/1982  Years since quitting: 36.5  . Smokeless tobacco: Never Used  Substance and Sexual Activity  . Alcohol use: No  . Drug use: No  . Sexual activity: Not on file

## 2019-05-22 ENCOUNTER — Other Ambulatory Visit: Payer: Self-pay | Admitting: Cardiology

## 2019-08-20 ENCOUNTER — Ambulatory Visit: Payer: Medicare Other

## 2019-08-26 ENCOUNTER — Ambulatory Visit: Payer: Medicare Other

## 2019-08-30 ENCOUNTER — Ambulatory Visit: Payer: Medicare Other | Admitting: Cardiology

## 2019-09-14 ENCOUNTER — Telehealth: Payer: Self-pay | Admitting: Cardiology

## 2019-09-14 MED ORDER — ROSUVASTATIN CALCIUM 40 MG PO TABS: 40.0000 mg | ORAL_TABLET | Freq: Every day | ORAL | 0 refills | Status: DC

## 2019-09-14 NOTE — Telephone Encounter (Signed)
crestor refilled

## 2019-10-10 ENCOUNTER — Ambulatory Visit: Payer: Medicare Other | Admitting: Cardiology

## 2019-10-10 ENCOUNTER — Encounter: Payer: Self-pay | Admitting: Cardiology

## 2019-10-10 ENCOUNTER — Other Ambulatory Visit: Payer: Self-pay

## 2019-10-10 VITALS — BP 142/78 | HR 76 | Temp 97.3°F | Ht 74.0 in | Wt 208.0 lb

## 2019-10-10 DIAGNOSIS — E785 Hyperlipidemia, unspecified: Secondary | ICD-10-CM | POA: Diagnosis not present

## 2019-10-10 DIAGNOSIS — I951 Orthostatic hypotension: Secondary | ICD-10-CM

## 2019-10-10 DIAGNOSIS — I2510 Atherosclerotic heart disease of native coronary artery without angina pectoris: Secondary | ICD-10-CM

## 2019-10-10 DIAGNOSIS — R55 Syncope and collapse: Secondary | ICD-10-CM

## 2019-10-10 DIAGNOSIS — I10 Essential (primary) hypertension: Secondary | ICD-10-CM | POA: Diagnosis not present

## 2019-10-10 DIAGNOSIS — Z9861 Coronary angioplasty status: Secondary | ICD-10-CM

## 2019-10-10 NOTE — Progress Notes (Signed)
Primary Care Provider: [name], MD Cardiologist: Glenetta Hew, MD Electrophysiologist: None  Clinic Note: Chief Complaint  Patient presents with  . Follow-up    No major complaints other than one episode of syncope in July  . Coronary Artery Disease    No angina   HPI:    Jeremy Deleon is a 86 y.o. male with a distant history of CAD having PCI (DES PCI LAD in 2006) as well as SCCA Lung Cancer (s/p L-sided VATS and mediastinal LN dissection Jan 2016) who presents today for annual follow-up.  Jeremy Deleon was last seen on February 5 09/09/2018.  He had had an episode of dizziness and weakness for which she was hospitalized in December 2019.  Since then he was doing fairly well back to normal.  They did determine that this episode was probably related to dehydration, and recommended hydration.  Since then he is doing a pretty good job drinking.  Also his furosemide dose was reduced to as needed.  Recent Hospitalizations:   Syncope 01/2019 -> Again thought to be related to dehydration and inadequate p.o. intake.  He has been doing a lot of work out in the heat of the day.  Was in his car at a gas station, got up to pump gas and passed out.  No sensation of irregular heartbeats.  He did hit his shoulder.  Did not hit his head.  Reviewed  CV studies:    The following studies were reviewed today: (if available, images/films reviewed: From Epic Chart or Care Everywhere) . None:  Interval History:   Jeremy Deleon returns here today for follow-up actually doing pretty well.  He really notes frequent nocturia atthe his major complaint.  Back in July he did at that episode of syncope which sounds very consistent with dehydration and vasovagal syncope.  He has been doing a good job of trying to increase his hydration but is also now on less Lasix.  He is really taking it as needed only.  In all he is off of blood pressures as Imdur.  Also since that fall, he was told to reduce his  aspirin to 1/2 tablet.  From a cardiac standpoint, it other than that one episode of syncope, he has not had any further syncope.  He has stable lower extremity swelling which really does not bother him.  He would prefer to avoid taking extra Lasix.  The leg swelling does go down he puts his feet up.  He denies any rapid irregular heartbeats or palpitations.  Certainly nothing that he had a passout spell. No chest pain or pressure with rest or exertion.  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  Valentino Saxon to avoid the heat.  CV Review of Symptoms (Summary) Cardiovascular ROS: no chest pain or dyspnea on exertion positive for - loss of consciousness and Noted above.  Also mild swelling. negative for - irregular heartbeat, orthopnea, paroxysmal nocturnal dyspnea, rapid heart rate, shortness of breath or No further syncope.  No near syncope, TIA or amaurosis fugax symptoms.  No claudication.  The patient does not have symptoms concerning for COVID-19 infection (fever, chills, cough, or new shortness of breath).  The patient is practicing social distancing & Masking.  I did not ask, but I think he did mention that he had taken his Covid vaccine.  REVIEWED OF SYSTEMS   Review of Systems  Constitutional: Negative for malaise/fatigue and  weight loss.  HENT: Negative for nosebleeds.   Respiratory: Negative for shortness of breath (If he overdoes it).   Cardiovascular: Positive for leg swelling (Stable).  Gastrointestinal: Positive for abdominal pain and constipation. Negative for blood in stool and melena.  Genitourinary: Positive for frequency (Mostly nocturia.  At least 3 but usually 3-6 times at night.).  Musculoskeletal: Negative for falls and joint pain.       Somewhat unsteady gait when he gets up quickly  Neurological: Negative for dizziness (Only when he gets up quickly at night), weakness and headaches.  Psychiatric/Behavioral: Negative for memory loss. The  patient does not have insomnia (Not insomnia, just frequent urination.).     I have reviewed and (if needed) personally updated the patient's problem list, medications, allergies, past medical and surgical history, social and family history.   PAST MEDICAL HISTORY   Past Medical History:  Diagnosis Date  . BPH (benign prostatic hyperplasia)   . CAD S/P percutaneous coronary angioplasty 07/25/2004   Cardiologist-- Dr Ellyn Hack - [facility] Myoview with inferolateral ischemia: --> per cardiac cath 09-16-2004 -- 80% prox LAD lesion PCI: Taxus DES 3.0 mm x 16 mm-;; Myoview June 2009 no ischemia or infarction with attenuation artifact  . Chronic constipation   . Dyslipidemia, goal LDL below 70    on simvastatin and tricor  . Emphysema of lung ([place])   . Full dentures   . GERD (gastroesophageal reflux disease)   . Graves' disease 11/ 2016 dx   endocrinologist-  dr Renato Shin-- treated with tapazole until 2018 when labs were normal  . History of basal cell carcinoma (BCC) excision   . Hypertension   . Left hydrocele   . Mild atherosclerosis of both carotid arteries    duplex 09-09-2016  1-39% bilateral ICA  . Osteoarthritis   . S/P drug eluting coronary stent placement 09/16/2004   x1  to prox. LAD  . Squamous cell lung cancer : Left lower lobe 07/2014   - oncologist-  dr Julien Nordmann---  per lov note in epic no recurrence: s/p  Bronchoscopy showed extrinsic compression of the left lower lobe with endobronchial biopsies were negative. He subsequently had a needle biopsy which dx the mass as squamous cell carcinoma /  08-10-2014  s/p  Left VATS w/ LLlobectomy with node dissections and completed chemotherapy (11/2014)  . Wears glasses   . Wears hearing aid in both ears     PAST SURGICAL HISTORY   Past Surgical History:  Procedure Laterality Date  . APPENDECTOMY  child  . CATARACT EXTRACTION W/ INTRAOCULAR LENS  IMPLANT, BILATERAL  2016 & 2017  . HYDROCELE EXCISION Left 03/12/2018    Procedure: HYDROCELECTOMY ADULT;  Surgeon: Ardis Hughs, MD;  Location: [facility];  Service: Urology;  Laterality: Left;  . NM MYOVIEW LTD  January 06, 2008    treadmill  myoview - tissue attenuation but no ischemia or infarction  . PERCUTANEOUS CORONARY STENT INTERVENTION (PCI-S)  2/27/ 2006    dr Tami Ribas  @[facility]    (07-25-2004 positive cardiolite for ischemia)  balloon angioplasty and TAXUS DES 3.0 mm x 16 mm stent ->  PROX  LAD (80%)  . TRANSTHORACIC ECHOCARDIOGRAM  09/16/2016   Limited echo with bubble study: EF 60- 65%, GR 1 DD./ Negative bubble study/  mild AR/  mild dilated ascending aorta/ No regional wall motion abnormality  . UMBILICAL HERNIA REPAIR  11-23-2007    dr Rise Patience   @[facility]   . VIDEO ASSISTED THORACOSCOPY (VATS)/ LOBECTOMY  Left 08/10/2014   Procedure: VIDEO ASSISTED THORACOSCOPY (VATS)/ LOBECTOMY;  Surgeon: Ivin Poot, MD;  Location: [place];  Service: Thoracic;  Laterality: Left;  Marland Kitchen VIDEO BRONCHOSCOPY N/A 08/10/2014   Procedure: VIDEO BRONCHOSCOPY;  Surgeon: Ivin Poot, MD;  Location: [facility] OR;  Service: Thoracic;  Laterality: N/A;    MEDICATIONS/ALLERGIES   Current Meds  Medication Sig  . aspirin EC 81 MG tablet Take 81 mg by mouth daily.  . Bisacodyl (LAXATIVE PO) Take by mouth as needed.  . fenofibrate (TRICOR) 145 MG tablet TAKE 1 TABLET(145 MG) BY MOUTH DAILY  . isosorbide mononitrate (IMDUR) 30 MG 24 hr tablet TAKE 1 TABLET(30 MG) BY MOUTH DAILY  . levothyroxine (SYNTHROID, LEVOTHROID) 50 MCG tablet Take 1 tablet by mouth daily.  . rosuvastatin (CRESTOR) 40 MG tablet Take 1 tablet (40 mg total) by mouth daily.    Allergies  Allergen Reactions  . Niaspan [Niacin Er] Rash and Other (See Comments)    flushing    SOCIAL HISTORY/FAMILY HISTORY   Reviewed in Epic:  Pertinent findings: No new changes.  OBJCTIVE -PE, EKG, labs   Wt Readings from Last 3 Encounters:  10/10/19 208 lb (94.3 kg)  05/20/19 205 lb (93 kg)  05/19/19 206 lb 8  oz (93.7 kg)    Physical Exam: BP (!) 142/78   Pulse 76   Temp (!) 97.3 F (36.3 C)   Ht 6\' 2"  (1.88 m)   Wt 208 lb (94.3 kg)   SpO2 95%   BMI 26.71 kg/m He tells me his blood pressures at home usually run anywhere from 115/60 up to 130/70.  This morning was 123/70 mmHg. Physical Exam  Constitutional: He is oriented to person, place, and time. He appears well-developed and well-nourished. No distress.  Healthy-appearing elderly gentleman.  Well-groomed.  HENT:  Head: Normocephalic and atraumatic.  Hard of hearing  Cardiovascular: Normal rate, regular rhythm, S1 normal, S2 normal and intact distal pulses.  No extrasystoles are present. PMI is not displaced. Exam reveals distant heart sounds. Exam reveals no gallop and no friction rub.  No murmur heard. Pulmonary/Chest: Effort normal and breath sounds normal. No respiratory distress. He has no wheezes. He has no rales.  Musculoskeletal:        General: Edema (1+ ankle edema) present.     Cervical back: Normal range of motion and neck supple.  Neurological: He is alert and oriented to person, place, and time.  Psychiatric: He has a normal mood and affect. His behavior is normal. Judgment and thought content normal.  Vitals reviewed.    Adult ECG Report  Rate: 76;  Rhythm: normal sinus rhythm and Voltage criteria for LVH.  Nonspecific ST and T wave changes.  Otherwise normal axis, intervals and durations.;   Narrative Interpretation: Stable EKG  Recent Labs:   Lab Results  Component Value Date   CHOL 118 12/21/2018   HDL 35 (L) 12/21/2018   LDL 48 12/21/2018   TRIG  173 (H) 12/21/2018   CHOLHDL 4.2 12/21/2018   Lab Results  Component Value Date   CREATININE 1.15 05/17/2019   BUN 17 05/17/2019   NA 143 05/17/2019   K 4.5 05/17/2019   CL 108 05/17/2019   CO2 27 05/17/2019   Lab Results  Component Value Date   TSH 2.35 09/24/2016    ASSESSMENT/PLAN    Problem List Items Addressed This Visit    CAD S/P  percutaneous coronary angioplasty -- Taxus DES in Prox LAD (Chronic)  He is now 15 years out from his PCI for inferolateral ischemia.  He had a follow-up Myoview was negative for ischemia.  Not having any anginal symptoms.  We are really try to minimize medications.  Currently on 162 mg aspirin (one half of a 325 mg tablet) because of his TIA. He is on fenofibrate plus rosuvastatin. On Imdur but no beta-blocker because of history of fatigue and bradycardia.  Not on other blood pressure medications to allow for mild permissive hypertension given his recurrent syncope.      Relevant Medications   aspirin EC 81 MG tablet   Other Relevant Orders   Lipid panel   Comprehensive metabolic panel   Dyslipidemia, goal LDL below 70 (Chronic)    Last labs were from June 2020 and his LDL looked great.  These are found in [place].  We had switched him from atorvastatin to rosuvastatin last year and the lipids look much better as of June. For now his triglycerides are still elevated so would like to continue fenofibrate although low threshold to consider switching to Vascepa for more data driven therapy.      Relevant Medications   aspirin EC 81 MG tablet   Other Relevant Orders   Lipid panel   Comprehensive metabolic panel   Essential hypertension (Chronic)    He actually has a history of hypotension but is pre much orthostatic in nature.  He had been on a cart a second HCTZ and we switched him to losartan which he finally discontinued.  He has not had a foot tachycardia shows, in fact his had bradycardia and hypotension spells.  Therefore we stopped beta-blocker.  Allowing for mild permissive hypertension, his pressures today look great.  He tells me that his pressures are a little bit lower than this at home.  For now would not treat unless he has significant hypertension.  Allow for systolic pressures as high as 160      Relevant Medications   aspirin EC 81 MG tablet   Other Relevant  Orders   Lipid panel   Comprehensive metabolic panel   EKG [id number] (Completed)   Orthostatic hypotension; with baseline hypotension (Chronic)    Again, we are allowing permissive hypertension.  Encourage adequate hydration.  Only use Lasix as needed.      Relevant Medications   aspirin EC 81 MG tablet   Syncope and collapse - Primary    This would be 2 episodes of what sounds like orthostatic hypotension related syncope can combination with dehydration.  He had an episode in late 2019 and then again in July 2020.  We have taken him off all of his blood pressure medications and made his diuretic as needed.  Encourage adequate hydration. I did encourage support stockings as well for his edema.  This will help increase intravascular volume.  Continue to allow permissive hypertension.      Relevant Medications   aspirin EC 81 MG tablet      COVID-19 Education: The signs and symptoms of COVID-19 were discussed with the patient and how to seek care for testing (follow up with PCP or arrange E-visit).   The importance of social distancing was discussed today.  I spent a total of 35minutes with the patient. >  50% of the time was spent in direct patient consultation.  Additional time spent with chart review  / charting (studies, outside notes, etc): 8 Total Time: 30 min   Current medicines are reviewed at length with the patient today.  (+/-  concerns) none  Notice: This dictation was prepared with Dragon dictation along with smaller phrase technology. Any transcriptional errors that result from this process are unintentional and may not be corrected upon review.  Patient Instructions / Medication Changes & Studies & Tests Ordered   Patient Instructions  Medication Instructions:  No medication  Changes  *If you need a refill on your cardiac medications before your next appointment, please call your pharmacy*   Lab Work: CMP LIPID_ FASTING  If you have labs (blood work) drawn  today and your tests are completely normal, you will receive your results only by: Marland Kitchen MyChart Message (if you have MyChart) OR . A paper copy in the mail If you have any lab test that is abnormal or we need to change your treatment, we will call you to review the results.   Testing/Procedures: Not needed   Follow-Up: At [facility], you and your health needs are our priority.  As part of our continuing mission to provide you with exceptional heart care, we have created designated Provider Care Teams.  These Care Teams include your primary Cardiologist (physician) and Advanced Practice Providers (APPs -  Physician Assistants and Nurse Practitioners) who all work together to provide you with the care you need, when you need it.  We recommend signing up for the patient portal called "MyChart".  Sign up information is provided on this After Visit Summary.  MyChart is used to connect with patients for Virtual Visits (Telemedicine).  Patients are able to view lab/test results, encounter notes, upcoming appointments, etc.  Non-urgent messages can be sent to your provider as well.   To learn more about what you can do with MyChart, go to [email or web address].    Your next appointment:   12 month(s)  The format for your next appointment:   In Person  Provider:   Glenetta Hew, MD   Other Instructions    Studies Ordered:   Orders Placed This Encounter  Procedures  . Lipid panel  . Comprehensive metabolic panel  . EKG 12-Lead     Glenetta Hew, M.D., M.S. Interventional Cardiologist   Pager # [phone] Phone # [phone] [address] [place], [place] [address]   Thank you for choosing Heartcare at [facility]!!

## 2019-10-10 NOTE — Patient Instructions (Signed)
Medication Instructions:  No medication  Changes  *If you need a refill on your cardiac medications before your next appointment, please call your pharmacy*   Lab Work: [place]  If you have labs (blood work) drawn today and your tests are completely normal, you will receive your results only by: Marland Kitchen MyChart Message (if you have MyChart) OR . A paper copy in the mail If you have any lab test that is abnormal or we need to change your treatment, we will call you to review the results.   Testing/Procedures: Not needed   Follow-Up: At [facility], you and your health needs are our priority.  As part of our continuing mission to provide you with exceptional heart care, we have created designated Provider Care Teams.  These Care Teams include your primary Cardiologist (physician) and Advanced Practice Providers (APPs -  Physician Assistants and Nurse Practitioners) who all work together to provide you with the care you need, when you need it.  We recommend signing up for the patient portal called "MyChart".  Sign up information is provided on this After Visit Summary.  MyChart is used to connect with patients for Virtual Visits (Telemedicine).  Patients are able to view lab/test results, encounter notes, upcoming appointments, etc.  Non-urgent messages can be sent to your provider as well.   To learn more about what you can do with MyChart, go to [email or web address].    Your next appointment:   12 month(s)  The format for your next appointment:   In Person  Provider:   Glenetta Hew, MD   Other Instructions

## 2019-10-14 ENCOUNTER — Encounter: Payer: Self-pay | Admitting: Cardiology

## 2019-10-14 NOTE — Assessment & Plan Note (Signed)
He actually has a history of hypotension but is pre much orthostatic in nature.  He had been on a cart a second HCTZ and we switched him to losartan which he finally discontinued.  He has not had a foot tachycardia shows, in fact his had bradycardia and hypotension spells.  Therefore we stopped beta-blocker.  Allowing for mild permissive hypertension, his pressures today look great.  He tells me that his pressures are a little bit lower than this at home.  For now would not treat unless he has significant hypertension.  Allow for systolic pressures as high as 160

## 2019-10-14 NOTE — Assessment & Plan Note (Signed)
This would be 2 episodes of what sounds like orthostatic hypotension related syncope can combination with dehydration.  He had an episode in late 2019 and then again in July 2020.  We have taken him off all of his blood pressure medications and made his diuretic as needed.  Encourage adequate hydration. I did encourage support stockings as well for his edema.  This will help increase intravascular volume.  Continue to allow permissive hypertension.

## 2019-10-14 NOTE — Assessment & Plan Note (Signed)
He is now 15 years out from his PCI for inferolateral ischemia.  He had a follow-up Myoview was negative for ischemia.  Not having any anginal symptoms.  We are really try to minimize medications.  Currently on 162 mg aspirin (one half of a 325 mg tablet) because of his TIA. He is on fenofibrate plus rosuvastatin. On Imdur but no beta-blocker because of history of fatigue and bradycardia.  Not on other blood pressure medications to allow for mild permissive hypertension given his recurrent syncope.

## 2019-10-14 NOTE — Assessment & Plan Note (Signed)
Last labs were from June 2020 and his LDL looked great.  These are found in [place].  We had switched him from atorvastatin to rosuvastatin last year and the lipids look much better as of June. For now his triglycerides are still elevated so would like to continue fenofibrate although low threshold to consider switching to Vascepa for more data driven therapy.

## 2019-10-14 NOTE — Assessment & Plan Note (Signed)
Again, we are allowing permissive hypertension.  Encourage adequate hydration.  Only use Lasix as needed.

## 2019-11-17 ENCOUNTER — Other Ambulatory Visit: Payer: Self-pay

## 2019-11-17 ENCOUNTER — Ambulatory Visit (HOSPITAL_COMMUNITY)
Admission: RE | Admit: 2019-11-17 | Discharge: 2019-11-17 | Disposition: A | Discharge status: Home / Self Care | Payer: Medicare Other | Source: Ambulatory Visit | Attending: Internal Medicine | Admitting: Internal Medicine

## 2019-11-17 ENCOUNTER — Encounter (HOSPITAL_COMMUNITY): Payer: Self-pay

## 2019-11-17 ENCOUNTER — Inpatient Hospital Stay: Payer: Medicare Other | Attending: Internal Medicine

## 2019-11-17 DIAGNOSIS — C3432 Malignant neoplasm of lower lobe, left bronchus or lung: Secondary | ICD-10-CM | POA: Insufficient documentation

## 2019-11-17 DIAGNOSIS — C3490 Malignant neoplasm of unspecified part of unspecified bronchus or lung: Secondary | ICD-10-CM

## 2019-11-17 LAB — CBC WITH DIFFERENTIAL (CANCER CENTER ONLY)
Abs Immature Granulocytes: 0.01 10*3/uL (ref 0.00–0.07)
Basophils Absolute: 0 10*3/uL (ref 0.0–0.1)
Basophils Relative: 1 %
Eosinophils Absolute: 0.4 10*3/uL (ref 0.0–0.5)
Eosinophils Relative: 7 %
HCT: 44.1 % (ref 39.0–52.0)
Hemoglobin: 14.6 g/dL (ref 13.0–17.0)
Immature Granulocytes: 0 %
Lymphocytes Relative: 39 %
Lymphs Abs: 2.2 10*3/uL (ref 0.7–4.0)
MCH: 31.3 pg (ref 26.0–34.0)
MCHC: 33.1 g/dL (ref 30.0–36.0)
MCV: 94.6 fL (ref 80.0–100.0)
Monocytes Absolute: 0.5 10*3/uL (ref 0.1–1.0)
Monocytes Relative: 9 %
Neutro Abs: 2.5 10*3/uL (ref 1.7–7.7)
Neutrophils Relative %: 44 %
Platelet Count: 156 10*3/uL (ref 150–400)
RBC: 4.66 MIL/uL (ref 4.22–5.81)
RDW: 14.7 % (ref 11.5–15.5)
WBC Count: 5.6 10*3/uL (ref 4.0–10.5)
nRBC: 0 % (ref 0.0–0.2)

## 2019-11-17 LAB — CMP (CANCER CENTER ONLY)
ALT: 14 U/L (ref 0–44)
AST: 33 U/L (ref 15–41)
Albumin: 3.8 g/dL (ref 3.5–5.0)
Alkaline Phosphatase: 78 U/L (ref 38–126)
Anion gap: 8 (ref 5–15)
BUN: 19 mg/dL (ref 8–23)
CO2: 27 mmol/L (ref 22–32)
Calcium: 8.9 mg/dL (ref 8.9–10.3)
Chloride: 106 mmol/L (ref 98–111)
Creatinine: 1.14 mg/dL (ref 0.61–1.24)
GFR, Est AFR Am: >60 mL/min (ref >60)
GFR, Estimated: 59 mL/min — ABNORMAL LOW (ref >60)
Glucose, Bld: 85 mg/dL (ref 70–99)
Potassium: 4 mmol/L (ref 3.5–5.1)
Sodium: 141 mmol/L (ref 135–145)
Total Bilirubin: 0.6 mg/dL (ref 0.3–1.2)
Total Protein: 6.6 g/dL (ref 6.5–8.1)

## 2019-11-17 MED ORDER — IOHEXOL 300 MG/ML  SOLN
75.0000 mL | Freq: Once | INTRAMUSCULAR | Status: AC | PRN
  Administered 2019-11-17: 10:00:00 75 mL via INTRAVENOUS

## 2019-11-17 MED ORDER — SODIUM CHLORIDE (PF) 0.9 % IJ SOLN
INTRAMUSCULAR | Status: AC
  Filled 2019-11-17: qty 50

## 2019-11-21 ENCOUNTER — Telehealth: Payer: Self-pay | Admitting: Medical Oncology

## 2019-11-21 ENCOUNTER — Ambulatory Visit: Payer: Medicare Other | Admitting: Internal Medicine

## 2019-11-21 NOTE — Telephone Encounter (Signed)
Pt said the "appointment slipped my mind". R/scheduled.

## 2019-11-22 ENCOUNTER — Inpatient Hospital Stay: Payer: Medicare Other | Attending: Internal Medicine | Admitting: Internal Medicine

## 2019-11-22 ENCOUNTER — Encounter: Payer: Self-pay | Admitting: Internal Medicine

## 2019-11-22 ENCOUNTER — Other Ambulatory Visit: Payer: Self-pay

## 2019-11-22 VITALS — BP 112/63 | HR 70 | Temp 98.7°F | Resp 17 | Ht 74.0 in | Wt 207.9 lb

## 2019-11-22 DIAGNOSIS — C3490 Malignant neoplasm of unspecified part of unspecified bronchus or lung: Secondary | ICD-10-CM | POA: Diagnosis not present

## 2019-11-22 DIAGNOSIS — I10 Essential (primary) hypertension: Secondary | ICD-10-CM | POA: Diagnosis not present

## 2019-11-22 DIAGNOSIS — Z79899 Other long term (current) drug therapy: Secondary | ICD-10-CM | POA: Diagnosis not present

## 2019-11-22 DIAGNOSIS — Z9221 Personal history of antineoplastic chemotherapy: Secondary | ICD-10-CM | POA: Insufficient documentation

## 2019-11-22 DIAGNOSIS — Z85118 Personal history of other malignant neoplasm of bronchus and lung: Secondary | ICD-10-CM | POA: Diagnosis present

## 2019-11-22 DIAGNOSIS — C3491 Malignant neoplasm of unspecified part of right bronchus or lung: Secondary | ICD-10-CM | POA: Diagnosis not present

## 2019-11-22 LAB — COMPREHENSIVE METABOLIC PANEL
ALT: 11 IU/L (ref 0–44)
AST: 29 IU/L (ref 0–40)
Albumin/Globulin Ratio: 2 (ref 1.2–2.2)
Albumin: 4.2 g/dL (ref 3.6–4.6)
Alkaline Phosphatase: 75 IU/L (ref 39–117)
BUN/Creatinine Ratio: 17 (ref 10–24)
BUN: 21 mg/dL (ref 8–27)
Bilirubin Total: 0.5 mg/dL (ref 0.0–1.2)
CO2: 28 mmol/L (ref 20–29)
Calcium: 9.2 mg/dL (ref 8.6–10.2)
Chloride: 103 mmol/L (ref 96–106)
Creatinine, Ser: 1.25 mg/dL (ref 0.76–1.27)
GFR calc Af Amer: 61 mL/min/{1.73_m2} (ref >59)
GFR calc non Af Amer: 53 mL/min/{1.73_m2} — ABNORMAL LOW (ref >59)
Globulin, Total: 2.1 g/dL (ref 1.5–4.5)
Glucose: 94 mg/dL (ref 65–99)
Potassium: 4.4 mmol/L (ref 3.5–5.2)
Sodium: 140 mmol/L (ref 134–144)
Total Protein: 6.3 g/dL (ref 6.0–8.5)

## 2019-11-22 LAB — LIPID PANEL
Chol/HDL Ratio: 3.1 ratio (ref 0.0–5.0)
Cholesterol, Total: 120 mg/dL (ref 100–199)
HDL: 39 mg/dL — ABNORMAL LOW (ref >39)
LDL Chol Calc (NIH): 64 mg/dL (ref 0–99)
Triglycerides: 90 mg/dL (ref 0–149)
VLDL Cholesterol Cal: 17 mg/dL (ref 5–40)

## 2019-11-22 NOTE — Progress Notes (Signed)
[place] [phone]   [phone]  OFFICE PROGRESS NOTE  [name], Shyrl Numbers, MD [place] [address]  DIAGNOSIS: Stage IIA (T2b, N0, M0) non-small cell lung cancer consistent with squamous cell carcinoma diagnosed in January 2016  PRIOR THERAPY:  1) Status post left VATS with left lower lobectomy and mediastinal lymph node dissection under the care of Dr. Prescott Gum on 08/10/2014. 2) Adjuvant systemic chemotherapy with carboplatin for AUC of 5 on day 1 and gemcitabine 1000 MG/M2 on days 1 and 8 every 3 weeks. First dose 09/19/2014.  CURRENT THERAPY: Observation.  INTERVAL HISTORY: Jeremy Deleon 86 y.o. male returns to the clinic today for follow-up visit.  The patient is feeling fine today with no concerning complaints.  He denied having any chest pain, shortness of breath, cough or hemoptysis.  He denied having any fever or chills.  He has no nausea, vomiting, diarrhea or constipation.  He denied having any headache or visual changes.  He has no weight loss or night sweats.  He is here today for evaluation with repeat CT scan of the chest for restaging of his disease.  MEDICAL HISTORY: Past Medical History:  Diagnosis Date  . BPH (benign prostatic hyperplasia)   . CAD S/P percutaneous coronary angioplasty 07/25/2004   Cardiologist-- Dr Ellyn Hack - [facility] Myoview with inferolateral ischemia: --> per cardiac cath 09-16-2004 -- 80% prox LAD lesion PCI: Taxus DES 3.0 mm x 16 mm-;; Myoview June 2009 no ischemia or infarction with attenuation artifact  . Chronic constipation   . Dyslipidemia, goal LDL below 70    on simvastatin and tricor  . Emphysema of lung ([place])   . Full dentures   . GERD (gastroesophageal reflux disease)   . Graves' disease 11/ 2016 dx   endocrinologist-  dr Renato Shin-- treated with tapazole until 2018 when labs were normal  . History of basal cell carcinoma (BCC) excision   .  Hypertension   . Left hydrocele   . Mild atherosclerosis of both carotid arteries    duplex 09-09-2016  1-39% bilateral ICA  . Osteoarthritis   . S/P drug eluting coronary stent placement 09/16/2004   x1  to prox. LAD  . Squamous cell lung cancer : Left lower lobe 07/2014   - oncologist-  dr Julien Nordmann---  per lov note in epic no recurrence: s/p  Bronchoscopy showed extrinsic compression of the left lower lobe with endobronchial biopsies were negative. He subsequently had a needle biopsy which dx the mass as squamous cell carcinoma /  08-10-2014  s/p  Left VATS w/ LLlobectomy with node dissections and completed chemotherapy (11/2014)  . Wears glasses   . Wears hearing aid in both ears     ALLERGIES:  is allergic to niaspan [niacin er].  MEDICATIONS:  Current Outpatient Medications  Medication Sig Dispense Refill  . aspirin EC 81 MG tablet Take 81 mg by mouth daily.    . fenofibrate (TRICOR) 145 MG tablet TAKE 1 TABLET(145 MG) BY MOUTH DAILY 90 tablet 2  . isosorbide mononitrate (IMDUR) 30 MG 24 hr tablet TAKE 1 TABLET(30 MG) BY MOUTH DAILY 90 tablet 1  . levothyroxine (SYNTHROID, LEVOTHROID) 50 MCG tablet Take 1 tablet by mouth daily.    . rosuvastatin (CRESTOR) 40 MG tablet Take 1 tablet (40 mg total) by mouth daily. 90 tablet 0  . traMADol (ULTRAM) 50 MG tablet Take 50 mg by mouth 2 (two) times daily as  needed.     No current facility-administered medications for this visit.    SURGICAL HISTORY:  Past Surgical History:  Procedure Laterality Date  . APPENDECTOMY  child  . CATARACT EXTRACTION W/ INTRAOCULAR LENS  IMPLANT, BILATERAL  2016 & 2017  . HYDROCELE EXCISION Left 03/12/2018   Procedure: HYDROCELECTOMY ADULT;  Surgeon: Ardis Hughs, MD;  Location: [facility];  Service: Urology;  Laterality: Left;  . NM MYOVIEW LTD  January 06, 2008    treadmill  myoview - tissue attenuation but no ischemia or infarction  . PERCUTANEOUS CORONARY STENT INTERVENTION (PCI-S)   2/27/ 2006    dr Tami Ribas  @[facility]    (07-25-2004 positive cardiolite for ischemia)  balloon angioplasty and TAXUS DES 3.0 mm x 16 mm stent ->  PROX  LAD (80%)  . TRANSTHORACIC ECHOCARDIOGRAM  09/16/2016   Limited echo with bubble study: EF 60- 65%, GR 1 DD./ Negative bubble study/  mild AR/  mild dilated ascending aorta/ No regional wall motion abnormality  . UMBILICAL HERNIA REPAIR  11-23-2007    dr Rise Patience   @[facility]   . VIDEO ASSISTED THORACOSCOPY (VATS)/ LOBECTOMY Left 08/10/2014   Procedure: VIDEO ASSISTED THORACOSCOPY (VATS)/ LOBECTOMY;  Surgeon: Ivin Poot, MD;  Location: [place];  Service: Thoracic;  Laterality: Left;  Marland Kitchen VIDEO BRONCHOSCOPY N/A 08/10/2014   Procedure: VIDEO BRONCHOSCOPY;  Surgeon: Ivin Poot, MD;  Location: [facility] OR;  Service: Thoracic;  Laterality: N/A;    REVIEW OF SYSTEMS:  A comprehensive review of systems was negative.   PHYSICAL EXAMINATION: General appearance: alert, cooperative, fatigued and no distress Head: Normocephalic, without obvious abnormality, atraumatic Neck: no adenopathy, no JVD, supple, symmetrical, trachea midline and thyroid not enlarged, symmetric, no tenderness/mass/nodules Lymph nodes: Cervical, supraclavicular, and axillary nodes normal. Resp: clear to auscultation bilaterally Back: symmetric, no curvature. ROM normal. No CVA tenderness. Cardio: normal apical impulse GI: soft, non-tender; bowel sounds normal; no masses,  no organomegaly Extremities: extremities normal, atraumatic, no cyanosis or edema  ECOG PERFORMANCE STATUS: 1 - Symptomatic but completely ambulatory  Blood pressure 112/63, pulse 70, temperature 98.7 F (37.1 C), temperature source Oral, resp. rate 17, height 6\' 2"  (1.88 m), weight 207 lb 14.4 oz (94.3 kg), SpO2 98 %.  LABORATORY DATA: Lab Results  Component Value Date   WBC 5.6 11/17/2019   HGB 14.6 11/17/2019   HCT 44.1 11/17/2019   MCV 94.6 11/17/2019   PLT 156 11/17/2019      Chemistry      Component Value  Date/Time   NA 141 11/17/2019 0927   NA 141 03/24/2017 1018   K 4.0 11/17/2019 0927   K 4.2 03/24/2017 1018   CL 106 11/17/2019 0927   CO2 27 11/17/2019 0927   CO2 25 03/24/2017 1018   BUN 19 11/17/2019 0927   BUN 16.5 03/24/2017 1018   CREATININE 1.14 11/17/2019 0927   CREATININE 1.2 03/24/2017 1018      Component Value Date/Time   CALCIUM 8.9 11/17/2019 0927   CALCIUM 9.6 03/24/2017 1018   ALKPHOS 78 11/17/2019 0927   ALKPHOS 87 03/24/2017 1018   AST 33 11/17/2019 0927   AST 30 03/24/2017 1018   ALT 14 11/17/2019 0927   ALT 17 03/24/2017 1018   BILITOT 0.6 11/17/2019 0927   BILITOT 0.56 03/24/2017 1018       RADIOGRAPHIC STUDIES: CT Chest W Contrast  Result Date: 11/17/2019 CLINICAL DATA:  Primary Cancer Type: Lung Imaging Indication: Routine surveillance Interval therapy since last imaging? No  Initial Cancer Diagnosis Date: 07/25/2014 Established by: Biopsy-proven Detailed Pathology: Squamous cell lung carcinoma Primary Tumor location: Left lower lobe Surgeries: VATS left lower lobectomy with mediastinal lymph node dissection 08/10/2014 Chemotherapy: Yes Ongoing? No Most recent administration: 07/18/2016 Other Cancers: History of basal cell carcinoma (BCC) excision EXAM: CT CHEST WITH CONTRAST TECHNIQUE: Multidetector CT imaging of the chest was performed during intravenous contrast administration. CONTRAST:  44mL OMNIPAQUE IOHEXOL 300 MG/ML  SOLN COMPARISON:  Most recent CT Chest 05/17/2019.  07/03/2014 PET-CT. FINDINGS: Cardiovascular: Normal heart size. No significant pericardial effusion/thickening. Left main and 3 vessel coronary atherosclerosis. Atherosclerotic nonaneurysmal thoracic aorta. Normal caliber pulmonary arteries. No central pulmonary emboli. Mediastinum/Nodes: No discrete thyroid nodules. Unremarkable esophagus. No pathologically enlarged axillary, mediastinal or hilar lymph nodes. Lungs/Pleura: No pneumothorax. No pleural effusion. Left lower lobectomy. No acute  consolidative airspace disease or lung masses. Stable calcified scattered right granulomas. A few scattered solid pulmonary nodules in both lungs are all stable, largest 8 mm in the posterior inferior left upper lobe (series 7/image 97), previously 8 mm using similar measurement technique, and 4 mm in the peripheral left upper lobe (series 7/image 90), previously 4 mm. No new significant pulmonary nodules. Mild centrilobular emphysema. Upper abdomen: Cholelithiasis. Musculoskeletal: No aggressive appearing focal osseous lesions. Moderate thoracic spondylosis. IMPRESSION: 1. No evidence of local tumor recurrence in the left lung status post left lower lobectomy. 2. No evidence of metastatic disease in the chest. 3. Left main and 3 vessel coronary atherosclerosis. 4. Cholelithiasis. 5. Aortic Atherosclerosis (ICD10-I70.0) and Emphysema (ICD10-J43.9). Electronically Signed   By: Ilona Sorrel M.D.   On: 11/17/2019 10:43    ASSESSMENT AND PLAN:  This is a very pleasant 86 years old white male with history of stage IIA non-small cell lung cancer, squamous cell carcinoma status post left lower lobectomy with lymph node dissection followed by adjuvant systemic chemotherapy with carboplatin and gemcitabine for 4 cycles. The patient has been observation since June 2016. He is feeling fine today with no concerning complaints. The patient had repeat CT scan of the chest performed recently.  I personally and independently reviewed the scans and discussed the results with the patient today. His scan showed no concerning findings for disease recurrence or progression. I discussed the scan results with the patient and recommended for him to continue on observation. I will see him back for follow-up visit in 1 year with repeat CT scan of the chest for restaging of his disease. The patient was advised to call immediately if he has any concerning symptoms in the interval. The patient voices understanding of current disease  status and treatment options and is in agreement with the current care plan. All questions were answered. The patient knows to call the clinic with any problems, questions or concerns. We can certainly see the patient much sooner if necessary.  Disclaimer: This note was dictated with voice recognition software. Similar sounding words can inadvertently be transcribed and may not be corrected upon review.

## 2019-11-23 ENCOUNTER — Other Ambulatory Visit: Payer: Self-pay

## 2019-11-23 ENCOUNTER — Telehealth: Payer: Self-pay | Admitting: Internal Medicine

## 2019-11-23 MED ORDER — ISOSORBIDE MONONITRATE ER 30 MG PO TB24: ORAL_TABLET | ORAL | 3 refills | Status: DC

## 2019-11-23 NOTE — Telephone Encounter (Signed)
Scheduled per los. Called and left msg. Mailed printout  °

## 2019-12-13 ENCOUNTER — Other Ambulatory Visit: Payer: Self-pay

## 2019-12-13 MED ORDER — ROSUVASTATIN CALCIUM 40 MG PO TABS: 40.0000 mg | ORAL_TABLET | Freq: Every day | ORAL | 3 refills | Status: DC

## 2019-12-14 ENCOUNTER — Other Ambulatory Visit: Payer: Self-pay

## 2020-01-13 ENCOUNTER — Other Ambulatory Visit: Payer: Self-pay | Admitting: Cardiology

## 2020-01-15 ENCOUNTER — Emergency Department (HOSPITAL_BASED_OUTPATIENT_CLINIC_OR_DEPARTMENT_OTHER)
Admission: EM | Admit: 2020-01-15 | Discharge: 2020-01-15 | Disposition: A | Discharge status: Home / Self Care | Payer: Medicare Other | Attending: Emergency Medicine | Admitting: Emergency Medicine

## 2020-01-15 ENCOUNTER — Encounter (HOSPITAL_BASED_OUTPATIENT_CLINIC_OR_DEPARTMENT_OTHER): Payer: Self-pay | Admitting: Emergency Medicine

## 2020-01-15 ENCOUNTER — Other Ambulatory Visit: Payer: Self-pay

## 2020-01-15 DIAGNOSIS — Z79899 Other long term (current) drug therapy: Secondary | ICD-10-CM | POA: Diagnosis not present

## 2020-01-15 DIAGNOSIS — Z7982 Long term (current) use of aspirin: Secondary | ICD-10-CM | POA: Diagnosis not present

## 2020-01-15 DIAGNOSIS — I2510 Atherosclerotic heart disease of native coronary artery without angina pectoris: Secondary | ICD-10-CM | POA: Diagnosis not present

## 2020-01-15 DIAGNOSIS — Z87891 Personal history of nicotine dependence: Secondary | ICD-10-CM | POA: Diagnosis not present

## 2020-01-15 DIAGNOSIS — I10 Essential (primary) hypertension: Secondary | ICD-10-CM | POA: Insufficient documentation

## 2020-01-15 DIAGNOSIS — Z7901 Long term (current) use of anticoagulants: Secondary | ICD-10-CM | POA: Diagnosis not present

## 2020-01-15 DIAGNOSIS — L03114 Cellulitis of left upper limb: Secondary | ICD-10-CM | POA: Diagnosis not present

## 2020-01-15 DIAGNOSIS — Z85118 Personal history of other malignant neoplasm of bronchus and lung: Secondary | ICD-10-CM | POA: Insufficient documentation

## 2020-01-15 DIAGNOSIS — W57XXXA Bitten or stung by nonvenomous insect and other nonvenomous arthropods, initial encounter: Secondary | ICD-10-CM | POA: Insufficient documentation

## 2020-01-15 DIAGNOSIS — R2232 Localized swelling, mass and lump, left upper limb: Secondary | ICD-10-CM | POA: Diagnosis present

## 2020-01-15 MED ORDER — DOXYCYCLINE HYCLATE 100 MG PO CAPS: 100.0000 mg | ORAL_CAPSULE | Freq: Two times a day (BID) | ORAL | 0 refills | Status: AC

## 2020-01-15 NOTE — ED Triage Notes (Addendum)
Tick bite to L arm. Redness noted. Pt is hard of hearing.

## 2020-01-15 NOTE — ED Notes (Signed)
ED Provider at bedside. 

## 2020-01-15 NOTE — ED Provider Notes (Signed)
[place] EMERGENCY DEPARTMENT Provider Note   CSN: [id number] Arrival date & time: 01/15/20  9417     History Chief Complaint  Patient presents with   Insect Bite    Jeremy Deleon is a 86 y.o. male.  The history is provided by the patient.  Animal Bite Contact animal:  Insect Location:  Shoulder/arm Shoulder/arm injury location:  L arm Pain details:    Quality:  Burning (itchy)   Severity:  Mild Relieved by:  Nothing Worsened by:  Nothing Associated symptoms: rash and swelling   Associated symptoms: no fever        Past Medical History:  Diagnosis Date   BPH (benign prostatic hyperplasia)    CAD S/P percutaneous coronary angioplasty 07/25/2004   Cardiologist-- Dr Ellyn Hack - Jan '06Abnormal Myoview with inferolateral ischemia: --> per cardiac cath 09-16-2004 -- 80% prox LAD lesion PCI: Taxus DES 3.0 mm x 16 mm-;; Myoview June 2009 no ischemia or infarction with attenuation artifact   Chronic constipation    Dyslipidemia, goal LDL below 70    on simvastatin and tricor   Emphysema of lung ([place])    Full dentures    GERD (gastroesophageal reflux disease)    Graves' disease 11/ 2016 dx   endocrinologist-  dr Renato Shin-- treated with tapazole until 2018 when labs were normal   History of basal cell carcinoma (BCC) excision    Hypertension    Left hydrocele    Mild atherosclerosis of both carotid arteries    duplex 09-09-2016  1-39% bilateral ICA   Osteoarthritis    S/P drug eluting coronary stent placement 09/16/2004   x1  to prox. LAD   Squamous cell lung cancer : Left lower lobe 07/2014   - oncologist-  dr Julien Nordmann---  per lov note in epic no recurrence: s/p  Bronchoscopy showed extrinsic compression of the left lower lobe with endobronchial biopsies were negative. He subsequently had a needle biopsy which dx the mass as squamous cell carcinoma /  08-10-2014  s/p  Left VATS w/ LLlobectomy with node dissections and completed chemotherapy  (11/2014)   Wears glasses    Wears hearing aid in both ears     Patient Active Problem List   Diagnosis Date Noted   Syncope and collapse 02/13/2019   Swelling of lower leg 09/14/2017   Retinal artery plaque 09/01/2016   Hyperthyroidism 07/26/2015   Full code status 04/23/2015   Orthostatic hypotension; with baseline hypotension 11/13/2014   Cellulitis 10/17/2014   Chemotherapy induced neutropenia ([employer] [employer]) 10/17/2014   S/P lobectomy of lung 08/10/2014   Squamous cell lung cancer : Left lower lobe  08/03/2014   Dyslipidemia, goal LDL below 70 10/25/2013   CAD S/P percutaneous coronary angioplasty -- Taxus DES in Prox LAD 10/25/2013   Essential hypertension     Past Surgical History:  Procedure Laterality Date   APPENDECTOMY  child   CATARACT EXTRACTION W/ INTRAOCULAR LENS  IMPLANT, BILATERAL  2016 & 2017   HYDROCELE EXCISION Left 03/12/2018   Procedure: HYDROCELECTOMY ADULT;  Surgeon: Ardis Hughs, MD;  [facility];  Service: Urology;  Laterality: Left;   NM MYOVIEW LTD  January 06, 2008    treadmill  myoview - tissue attenuation but no ischemia or infarction   PERCUTANEOUS CORONARY STENT INTERVENTION (PCI-S)  2/27/ 2006    dr Tami Ribas  @[facility]    (07-25-2004 positive cardiolite for ischemia)  balloon angioplasty and TAXUS DES 3.0 mm x 16 mm stent ->  PROX  LAD (80%)   TRANSTHORACIC ECHOCARDIOGRAM  09/16/2016   Limited echo with bubble study: EF 60- 65%, GR 1 DD./ Negative bubble study/  mild AR/  mild dilated ascending aorta/ No regional wall motion abnormality   UMBILICAL HERNIA REPAIR  11-23-2007    dr Rise Patience   @[facility]    VIDEO ASSISTED THORACOSCOPY (VATS)/ LOBECTOMY Left 08/10/2014   Procedure: VIDEO ASSISTED THORACOSCOPY (VATS)/ LOBECTOMY;  Surgeon: Ivin Poot, MD;  Location: [place];  Service: Thoracic;  Laterality: Left;   VIDEO BRONCHOSCOPY N/A 08/10/2014   Procedure: VIDEO BRONCHOSCOPY;  Surgeon: Ivin Poot, MD;   Location: [facility] OR;  Service: Thoracic;  Laterality: N/A;       Family History  Problem Relation Age of Onset   Cancer Mother    Heart disease Mother    Heart disease Father    Parkinsonism Brother    Thyroid disease Neg Hx     Social History   Tobacco Use   Smoking status: Former Smoker    Years: 20.00    Types: Cigarettes    Quit date: 10/25/1982    Years since quitting: 37.2   Smokeless tobacco: Never Used  Vaping Use   Vaping Use: Never used  Substance Use Topics   Alcohol use: No   Drug use: No    Home Medications Prior to Admission medications   Medication Sig Start Date End Date Taking? Authorizing Provider  aspirin EC 81 MG tablet Take 81 mg by mouth daily.    [provider]  doxycycline (VIBRAMYCIN) 100 MG capsule Take 1 capsule (100 mg total) by mouth 2 (two) times daily for 7 days. 01/15/20 01/22/20  Bradshaw Minihan, DO  fenofibrate (TRICOR) 145 MG tablet TAKE 1 TABLET(145 MG) BY MOUTH DAILY 01/13/20   Leonie Man, MD  isosorbide mononitrate (IMDUR) 30 MG 24 hr tablet TAKE 1 TABLET(30 MG) BY MOUTH DAILY 11/23/19   Leonie Man, MD  levothyroxine (SYNTHROID, LEVOTHROID) 50 MCG tablet Take 1 tablet by mouth daily. 06/28/18   [provider]  rosuvastatin (CRESTOR) 40 MG tablet Take 1 tablet (40 mg total) by mouth daily. 12/13/19 03/12/20  Leonie Man, MD  traMADol (ULTRAM) 50 MG tablet Take 50 mg by mouth 2 (two) times daily as needed. 10/20/19   [provider]    Allergies    Niaspan [niacin er]  Review of Systems   Review of Systems  Constitutional: Negative for fever.  Musculoskeletal: Negative for arthralgias and joint swelling.  Skin: Positive for color change and rash. Negative for pallor and wound.    Physical Exam Updated Vital Signs BP 115/74 (BP Location: Right Arm)    Pulse 72    Temp 97.7 F (36.5 C) (Oral)    Resp 18    Ht 6\' 1"  (1.854 m)    Wt 94.3 kg    SpO2 99%    BMI 27.44 kg/m   Physical  Exam Constitutional:      General: He is not in acute distress.    Appearance: He is not ill-appearing.  Cardiovascular:     Pulses: Normal pulses.  Skin:    General: Skin is warm.     Capillary Refill: Capillary refill takes less than 2 seconds.     Findings: Erythema present. No bruising.     Comments: Redness with mild swelling to the left arm just above the elbow with central area with mild skin breakdown  Neurological:     General: No focal deficit present.  Mental Status: He is alert.     ED Results / Procedures / Treatments   Labs (all labs ordered are listed, but only abnormal results are displayed) Labs Reviewed - No data to display  EKG None  Radiology No results found.  Procedures Procedures (including critical care time)  Medications Ordered in ED Medications - No data to display  ED Course  I have reviewed the triage vital signs and the nursing notes.  Pertinent labs & imaging results that were available during my care of the patient were reviewed by me and considered in my medical decision making (see chart for details).    MDM Rules/Calculators/A&P                          Jeremy Deleon is an 86 year old male who presents to the ED with left arm redness.  Patient normal vitals.  No fever.  Patient states that he pulled possibly a tick out of his left arm yesterday has redness and pain surrounding that area.  In the left arm just above the elbow joint.  No concern for septic joint.  Appears to have some cellulitis changes.  Will treat with doxycycline.  Overall appears well otherwise.  Discharged in good condition.  Understands return precautions.  This chart was dictated using voice recognition software.  Despite best efforts to proofread,  errors can occur which can change the documentation meaning.   Final Clinical Impression(s) / ED Diagnoses Final diagnoses:  Cellulitis of left upper extremity    Rx / DC Orders ED Discharge Orders          Ordered    doxycycline (VIBRAMYCIN) 100 MG capsule  2 times daily     Discontinue  Reprint     01/15/20 [place], [place], DO 01/15/20 1025

## 2020-09-19 ENCOUNTER — Encounter: Payer: Self-pay | Admitting: Cardiology

## 2020-09-19 ENCOUNTER — Ambulatory Visit: Payer: Medicare Other | Admitting: Cardiology

## 2020-09-19 ENCOUNTER — Other Ambulatory Visit: Payer: Self-pay

## 2020-09-19 VITALS — BP 115/71 | HR 60 | Ht 73.0 in | Wt 204.2 lb

## 2020-09-19 DIAGNOSIS — R55 Syncope and collapse: Secondary | ICD-10-CM

## 2020-09-19 DIAGNOSIS — E785 Hyperlipidemia, unspecified: Secondary | ICD-10-CM | POA: Diagnosis not present

## 2020-09-19 DIAGNOSIS — I951 Orthostatic hypotension: Secondary | ICD-10-CM | POA: Diagnosis not present

## 2020-09-19 DIAGNOSIS — I10 Essential (primary) hypertension: Secondary | ICD-10-CM

## 2020-09-19 DIAGNOSIS — Z9861 Coronary angioplasty status: Secondary | ICD-10-CM

## 2020-09-19 DIAGNOSIS — H3509 Other intraretinal microvascular abnormalities: Secondary | ICD-10-CM

## 2020-09-19 DIAGNOSIS — I2510 Atherosclerotic heart disease of native coronary artery without angina pectoris: Secondary | ICD-10-CM | POA: Diagnosis not present

## 2020-09-19 DIAGNOSIS — I708 Atherosclerosis of other arteries: Secondary | ICD-10-CM

## 2020-09-19 NOTE — Patient Instructions (Addendum)
Medication Instructions:  You can take ASA 81 mg  Monday through Friday  And hold on Saturday  And Sunday   *If you need a refill on your cardiac medications before your next appointment, please call your pharmacy*   Lab Work:  Not needed   Testing/Procedures: Not needed   Follow-Up: At [facility], you and your health needs are our priority.  As part of our continuing mission to provide you with exceptional heart care, we have created designated Provider Care Teams.  These Care Teams include your primary Cardiologist (physician) and Advanced Practice Providers (APPs -  Physician Assistants and Nurse Practitioners) who all work together to provide you with the care you need, when you need it.     Your next appointment:   12 month(s)  The format for your next appointment:   In Person  Provider:   Glenetta Hew, MD

## 2020-09-19 NOTE — Progress Notes (Signed)
Primary Care Provider: [name], MD Cardiologist: Glenetta Hew, MD Electrophysiologist: None  Clinic Note: Chief Complaint  Patient presents with  . Follow-up    Annual.  Doing well.  Activity limited by hip pain.    ===================================  ASSESSMENT/PLAN   Problem List Items Addressed This Visit    Retinal artery plaque    Poor vision, Continue aspirin a bit low dose along with lipid management with fenofibrate and rosuvastatin.      Syncope and collapse    No further episodes.  We talked about the importance of adequate hydration and have DC'd diuretic.  Continue to stay active with conditioning. Continue hydration.      CAD S/P percutaneous coronary angioplasty -- Taxus DES in Prox LAD (Chronic)    16 years out from his PCI for inferolateral ischemia on Myoview.  Follow-up Myoview is negative for ischemia.  Has not had any recurrent symptoms since.  Per discussion in the past, we are trying to minimize medications.  Plan: He is currently taking 81 mg a day, we can reduce it down to every other day. No longer on Thienopyridine-stopped because of bleeding and bruising.  Continue fenofibrate and rosuvastatin.  Monitor for side effects. Continue Imdur for blood pressure monitoring and potential spasm.      Relevant Orders   EKG 12-Lead (Completed)   Dyslipidemia, goal LDL below 70 (Chronic)    LDL was not as good in December as it was in May.  He is still taking fenofibrate at high dose along with rosuvastatin.  He may have potentially missed a few doses here there.  Would monitor for now, but if LDL goes up any higher, may need to be more aggressive.  He had a similar up-and-down from 2019-2020-2021      Essential hypertension - Primary (Chronic)    More likely can allow for permissive hypertension.  Blood pressures well controlled today on no medications.  Continue to monitor.  He is only on Imdur.      Relevant Orders   EKG 12-Lead  (Completed)   Orthostatic hypotension; with baseline hypotension (Chronic)    AnginaAllow for permissive hypertension.  On nothing but Imdur for benefit.  Not on beta-blocker or ARB/ACE inhibitor because of history of orthostatic hypotension.  Encourage adequate hydration.        ===================================  HPI:    Jeremy Deleon is a 87 y.o. male with a PMH notable for history of CAD-PCI (DES PCI LAD in 2006), SSCa -Left Lower Lobe (s/p VATS-Left Lower Lobe Lobectomy, and mediastinal LN dissection-January 2016) who presents today for annual follow-up.  Jeremy Deleon was last seen on 10/10/2019 for routine follow-up.  He was doing pretty well.  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  Unfortunately he hit his shoulder and broke his shoulder.  They decided not to operate.   The episode was clearly related to vasovagal syncope with dehydration.  He had been doing a good job trihydrate more vigorously and had cut down Lasix use.  Now is no longer on Lasix. ->  Was told to reduce aspirin to 1/2 tablet, and antihypertensives were removed. => After these changes, no further symptoms noted.  Mild lower extremity edema that goes down when he puts his feet  up.  No rapid regular beats palpitations.  No further syncope.  No chest pain or pressure.  No exertional dyspnea.  Not very active, but does yard work.  Recent Hospitalizations: None other than an ER visit for left upper arm cellulitis in June 2021  Reviewed  CV studies:    The following studies were reviewed today: (if available, images/films reviewed: From Epic Chart or Care Everywhere) . None:   Interval History:   Jeremy Deleon returns here for annual follow-up doing fairly well.  He has had no chest pain or pressure with rest or exertion.  No real problems with  lung cancer in the last 5 years.  He has some mild puffy edema at the end of the day, nothing significant.  He is not very active, and will have some exertional dyspnea if he overdoes it, not affect activities.  He can do some mild yard work around the yard, but has a pretty unsteady gait. He is pretty much limited by arthritis pains in his hip mostly.  He has a hard time going upstairs mostly because of arthritis issues.  The right hip is the worst.  He is not very physically active as far as any exercise, but is able to do his activities of daily living.    Is still driving, does light yard work as well as house chores..  He has hearing aids help him so that he can hear.    He is up at least 4 times a night to urinate-denies any hematuria.. Mild puffy swelling again today because not at night.  CV Review of Symptoms (Summary): positive for - Shortness of breath on exertion because of deconditioning and level of exertion due to arthritis, mild end of day swelling.  Well-controlled. negative for - chest pain, irregular heartbeat, orthopnea, palpitations, paroxysmal nocturnal dyspnea, rapid heart rate, shortness of breath or Lightheaded, dizzy, woozy, syncope/near syncope or TIA/amaurosis fugax, claudication.  The patient does not have symptoms concerning for COVID-19 infection (fever, chills, cough, or new shortness of breath).   REVIEWED OF SYSTEMS   Review of Systems  Constitutional: Positive for malaise/fatigue (If he is not active).  HENT: Negative for congestion and nosebleeds.   Respiratory: Negative for cough and shortness of breath.   Cardiovascular: Positive for leg swelling.  Gastrointestinal: Positive for abdominal pain. Negative for blood in stool and melena. Constipation: Off-and-on.  Genitourinary: Positive for frequency (Frequent nocturia.). Negative for hematuria.  Musculoskeletal: Positive for back pain and joint pain (Very limited by hip pain.). Negative for myalgias.   Neurological: Negative for dizziness, focal weakness and weakness.       Unsteady, wobbly gait because of arthritis pains.  Endo/Heme/Allergies: Bruises/bleeds easily.  Psychiatric/Behavioral: Negative for depression (Borderline dysthymia). The patient has insomnia (Mostly because he is up with frequent nocturia.).     I have reviewed and (if needed) personally updated the patient's problem list, medications, allergies, past medical and surgical history, social and family history.   PAST MEDICAL HISTORY   Past Medical History:  Diagnosis Date  . BPH (benign prostatic hyperplasia)   . CAD S/P percutaneous coronary angioplasty 07/25/2004   Cardiologist-- Dr Ellyn Hack - [facility] Myoview with inferolateral ischemia: --> per cardiac cath 09-16-2004 -- 80% prox LAD lesion PCI: Taxus DES 3.0 mm x 16 mm-;; Myoview June 2009 no ischemia or infarction with attenuation artifact  . Chronic constipation   . Dyslipidemia, goal LDL below 70    on simvastatin and tricor  . Emphysema of  lung ([place])   . Full dentures   . GERD (gastroesophageal reflux disease)   . Graves' disease 11/ 2016 dx   endocrinologist-  dr Renato Shin-- treated with tapazole until 2018 when labs were normal  . History of basal cell carcinoma (BCC) excision   . Hypertension   . Left hydrocele   . Mild atherosclerosis of both carotid arteries    duplex 09-09-2016  1-39% bilateral ICA  . Osteoarthritis   . S/P drug eluting coronary stent placement 09/16/2004   x1  to prox. LAD  . Squamous cell lung cancer : Left lower lobe 07/2014   - oncologist-  dr Julien Nordmann---  per lov note in epic no recurrence: s/p  Bronchoscopy showed extrinsic compression of the left lower lobe with endobronchial biopsies were negative. He subsequently had a needle biopsy which dx the mass as squamous cell carcinoma /  08-10-2014  s/p  Left VATS w/ LLlobectomy with node dissections and completed chemotherapy (11/2014)  . Wears glasses   . Wears hearing  aid in both ears     PAST SURGICAL HISTORY   Past Surgical History:  Procedure Laterality Date  . APPENDECTOMY  child  . CATARACT EXTRACTION W/ INTRAOCULAR LENS  IMPLANT, BILATERAL  2016 & 2017  . HYDROCELE EXCISION Left 03/12/2018   Procedure: HYDROCELECTOMY ADULT;  Surgeon: Ardis Hughs, MD;  Location: [facility];  Service: Urology;  Laterality: Left;  . NM MYOVIEW LTD  January 06, 2008    treadmill  myoview - tissue attenuation but no ischemia or infarction  . PERCUTANEOUS CORONARY STENT INTERVENTION (PCI-S)  2/27/ 2006    dr Tami Ribas  _0    (07-25-2004 positive cardiolite for ischemia)  balloon angioplasty and TAXUS DES 3.0 mm x 16 mm stent ->  PROX  LAD (80%)  . TRANSTHORACIC ECHOCARDIOGRAM  09/16/2016   Limited echo with bubble study: EF 60- 65%, GR 1 DD./ Negative bubble study/  mild AR/  mild dilated ascending aorta/ No regional wall motion abnormality  . UMBILICAL HERNIA REPAIR  11-23-2007    dr Rise Patience   _1   . VIDEO ASSISTED THORACOSCOPY (VATS)/ LOBECTOMY Left 08/10/2014   Procedure: VIDEO ASSISTED THORACOSCOPY (VATS)/ LOBECTOMY;  Surgeon: Ivin Poot, MD;  Location: [place];  Service: Thoracic;  Laterality: Left;  Marland Kitchen VIDEO BRONCHOSCOPY N/A 08/10/2014   Procedure: VIDEO BRONCHOSCOPY;  Surgeon: Ivin Poot, MD;  Location: [place];  Service: Thoracic;  Laterality: N/A;    Immunization History  Administered Date(s) Administered  . Influenza Split 06/05/2009, 05/02/2010, 06/09/2011, 03/29/2012, 06/23/2014  . Influenza, High Dose Seasonal PF 06/28/2018, 04/28/2019  . Pneumococcal Conjugate-13 04/25/2014  . Pneumococcal Polysaccharide-23 09/11/2009  . Tdap 11/01/2008, 06/12/2017  . Zoster 03/29/2012    MEDICATIONS/ALLERGIES   Current Meds  Medication Sig  . aspirin EC 81 MG tablet Take 81 mg by mouth daily.  . fenofibrate (TRICOR) 145 MG tablet TAKE 1 TABLET(145 MG) BY MOUTH DAILY  . isosorbide mononitrate (IMDUR) 30 MG 24 hr tablet TAKE 1  TABLET(30 MG) BY MOUTH DAILY  . levothyroxine (SYNTHROID, LEVOTHROID) 50 MCG tablet Take 1 tablet by mouth daily.  . traMADol (ULTRAM) 50 MG tablet Take 50 mg by mouth 2 (two) times daily as needed.    Allergies  Allergen Reactions  . Niaspan [Niacin Er] Rash and Other (See Comments)    flushing    SOCIAL HISTORY/FAMILY HISTORY   Reviewed in Epic:  Pertinent findings:  Social History   Tobacco Use  . Smoking status:  Former Smoker    Years: 20.00    Types: Cigarettes    Quit date: 10/25/1982    Years since quitting: 38.0  . Smokeless tobacco: Never Used  Vaping Use  . Vaping Use: Never used  Substance Use Topics  . Alcohol use: No  . Drug use: No   Social History   Social History Narrative    He is a widowed father of 54, grandfather of 83, still working. Does not smoke, quit 30   years ago and does not drink alcohol. He still works in his Nurse, learning disability for [employer].   He does still live alone, but has lots of help.  His son comes by to see him every night.  He goes out to eat lunch every day.   Able to carry out his ADLs including driving.   Does not get much exercise because of unsteady gait and orthostatic dizziness.   He does have a living well, and has a healthcare power of attorney.    OBJCTIVE -PE, EKG, labs   Wt Readings from Last 3 Encounters:  09/19/20 204 lb 3.2 oz (92.6 kg)  01/15/20 208 lb (94.3 kg)  11/22/19 207 lb 14.4 oz (94.3 kg)    Physical Exam: BP 115/71   Pulse 60   Ht 6' 1" (1.854 m)   Wt 204 lb 3.2 oz (92.6 kg)   SpO2 96%   BMI 26.94 kg/m  Physical Exam Constitutional:      General: He is not in acute distress.    Appearance: Normal appearance. He is normal weight. He is not ill-appearing (Healthy-appearing elderly gentleman.) or toxic-appearing.     Comments: Well-groomed.  Well-nourished.  HENT:     Head: Normocephalic and atraumatic.     Ears:     Comments: Very hard of hearing Neck:     Vascular: No  carotid bruit.  Cardiovascular:     Rate and Rhythm: Normal rate and regular rhythm.     Pulses: Normal pulses.     Heart sounds: No murmur heard. No friction rub. No gallop.   Pulmonary:     Effort: Pulmonary effort is normal. No respiratory distress.     Breath sounds: Normal breath sounds.     Comments: No wheezes rales or rhonchi. Chest:     Chest wall: No tenderness.  Musculoskeletal:     Cervical back: Normal range of motion and neck supple.  Skin:    General: Skin is warm and dry.  Neurological:     General: No focal deficit present.     Mental Status: He is alert and oriented to person, place, and time. Mental status is at baseline.     Gait: Gait abnormal (Somewhat slow, unsteady gait).  Psychiatric:        Mood and Affect: Mood normal.        Behavior: Behavior normal.        Thought Content: Thought content normal.        Judgment: Judgment normal.     Adult ECG Report  Rate: 60;  Rhythm: normal sinus rhythm, sinus arrhythmia and 1  AVB, normal axis, intervals and durations.;   Narrative Interpretation: Stable  Recent Labs: Reviewed  [place] Related to Lipid Panel Component 07/02/20 12/21/18 11/05/17  Cholesterol, Total 144 118 135  Triglycerides 165 173High 147  HDL 43 35Low 34Low  VLDL Cholesterol Cal 28 35 29  LDL 73 48 72   Comprehensive Metabolic Panel Component 41/28/78 06/24/19 12/21/18  Glucose 94 110High 108High  BUN _0 Creatinine 1.21 1.06 1.14  eGFR If NonAfrican American 54 64 59Low  eGFR If African American 63  74 68  BUN/Creatinine Ratio _1 Sodium 140 142 140  Potassium 4.3 3.9 3.9  Chloride 104 104 102  CO2 _2 CALCIUM 9.0 8.8 9.0  Total Protein 6.2 5.8Low 6.0  Albumin, Serum 3.9 4.0 4.0  Globulin, Total 2.3 1.8 2.0  Albumin/Globulin Ratio 1.7 2.2 2.0  Total Bilirubin 0.4 0.4 0.4  Alkaline Phosphatase 94  81 102  AST 34 33 24  ALT (SGPT) _3 CBC And Differential Component  07/02/20 12/21/18 11/05/17  WBC 6.1 5.6 6.3  RBC 4.63 4.50 4.66  Hemoglobin 14.6 14.1 14.1  Hematocrit 43.1 41.6 42.9  MCV 93 92 92  MCH 31.5 31.3 30.3  MCHC 33.9 33.9 32.9  RDW 14.0 13.9 14.6  Platelet Count 151 178 194  TSH Rfx on Abnormal to Free T4 Component 07/02/20 06/24/19 12/21/18 06/24/18  TSH 1.130 0.873 1.890 8.130High  Vitamin D 25 Hydroxy Component 07/02/20  Vit D, 25-Hydroxy 20.1     Lab Results  Component Value Date   CHOL 120 11/22/2019   HDL 39 (L) 11/22/2019   LDLCALC 64 11/22/2019   TRIG 90 11/22/2019   CHOLHDL 3.1 11/22/2019   Lab Results  Component Value Date   CREATININE 1.25 11/22/2019   BUN 21 11/22/2019   NA 140 11/22/2019   K 4.4 11/22/2019   CL 103 11/22/2019   CO2 28 11/22/2019   ==================================================  COVID-19 Education: The signs and symptoms of COVID-19 were discussed with the patient and how to seek care for testing (follow up with PCP or arrange E-visit).   The importance of social distancing and COVID-19 vaccination was discussed today. The patient is practicing social distancing & Masking.   I spent a total of 40mnutes with the patient spent in direct patient consultation.  Additional time spent with chart review  / charting (studies, outside notes, etc): 15 min Total Time: 35 min   Current medicines are reviewed at length with the patient today.  (+/- concerns) n/a  This visit occurred during the SARS-CoV-2 public health emergency.  Safety protocols were in place, including screening questions prior to the visit, additional usage of staff PPE, and extensive cleaning of exam room while observing appropriate contact time as indicated for disinfecting solutions.  Notice: This dictation was prepared with Dragon dictation along with smaller phrase technology. Any transcriptional errors that result from this process are unintentional and may not be corrected upon review.  Patient Instructions /  Medication Changes & Studies & Tests Ordered   Patient Instructions  Medication Instructions:  You can take ASA 81 mg  Monday through Friday  And hold on Saturday  And Sunday   *If you need a refill on your cardiac medications before your next appointment, please call your pharmacy*   Lab Work:  Not needed   Testing/Procedures: Not needed   Follow-Up: [facility] you and your health needs are our priority.  As part of our continuing mission to provide you with exceptional heart care, we have created designated Provider Care Teams.  These Care Teams include your primary Cardiologist (physician) and Advanced Practice Providers (APPs -  Physician Assistants and Nurse Practitioners) who all work together to provide you with the care you need, when you need it.     Your next appointment:  12 month(s)  The format for your next appointment:   In Person  Provider:   Glenetta Hew, MD     Studies Ordered:   Orders Placed This Encounter  Procedures  . EKG 12-Lead     Glenetta Hew, M.D., M.S. Interventional Cardiologist   Pager # [phone] Phone # [phone] [address] [place], [place] [address]   Thank you for choosing Heart[facility] at [facility]!!

## 2020-10-14 ENCOUNTER — Encounter: Payer: Self-pay | Admitting: Cardiology

## 2020-10-14 NOTE — Assessment & Plan Note (Signed)
16 years out from his PCI for inferolateral ischemia on Myoview.  Follow-up Myoview is negative for ischemia.  Has not had any recurrent symptoms since.  Per discussion in the past, we are trying to minimize medications.  Plan: He is currently taking 81 mg a day, we can reduce it down to every other day. No longer on Thienopyridine-stopped because of bleeding and bruising.  Continue fenofibrate and rosuvastatin.  Monitor for side effects. Continue Imdur for blood pressure monitoring and potential spasm.

## 2020-10-14 NOTE — Assessment & Plan Note (Signed)
LDL was not as good in December as it was in May.  He is still taking fenofibrate at high dose along with rosuvastatin.  He may have potentially missed a few doses here there.  Would monitor for now, but if LDL goes up any higher, may need to be more aggressive.  He had a similar up-and-down from 2019-2020-2021

## 2020-10-15 NOTE — Assessment & Plan Note (Signed)
Poor vision, Continue aspirin a bit low dose along with lipid management with fenofibrate and rosuvastatin.

## 2020-10-15 NOTE — Assessment & Plan Note (Signed)
More likely can allow for permissive hypertension.  Blood pressures well controlled today on no medications.  Continue to monitor.  He is only on Imdur.

## 2020-10-15 NOTE — Assessment & Plan Note (Signed)
AnginaAllow for permissive hypertension.  On nothing but Imdur for benefit.  Not on beta-blocker or ARB/ACE inhibitor because of history of orthostatic hypotension.  Encourage adequate hydration.

## 2020-10-15 NOTE — Assessment & Plan Note (Signed)
No further episodes.  We talked about the importance of adequate hydration and have DC'd diuretic.  Continue to stay active with conditioning. Continue hydration.

## 2020-10-17 ENCOUNTER — Other Ambulatory Visit: Payer: Self-pay | Admitting: Cardiology

## 2020-11-19 ENCOUNTER — Encounter (HOSPITAL_COMMUNITY): Payer: Self-pay

## 2020-11-19 ENCOUNTER — Inpatient Hospital Stay: Payer: Medicare Other | Attending: Internal Medicine

## 2020-11-19 ENCOUNTER — Other Ambulatory Visit: Payer: Self-pay

## 2020-11-19 ENCOUNTER — Ambulatory Visit (HOSPITAL_COMMUNITY)
Admission: RE | Admit: 2020-11-19 | Discharge: 2020-11-19 | Disposition: A | Discharge status: Home / Self Care | Payer: Medicare Other | Source: Ambulatory Visit | Attending: Internal Medicine | Admitting: Internal Medicine

## 2020-11-19 DIAGNOSIS — Z85118 Personal history of other malignant neoplasm of bronchus and lung: Secondary | ICD-10-CM | POA: Diagnosis present

## 2020-11-19 DIAGNOSIS — Z9221 Personal history of antineoplastic chemotherapy: Secondary | ICD-10-CM | POA: Diagnosis not present

## 2020-11-19 DIAGNOSIS — I700 Atherosclerosis of aorta: Secondary | ICD-10-CM | POA: Insufficient documentation

## 2020-11-19 DIAGNOSIS — C3490 Malignant neoplasm of unspecified part of unspecified bronchus or lung: Secondary | ICD-10-CM

## 2020-11-19 LAB — CBC WITH DIFFERENTIAL (CANCER CENTER ONLY)
Abs Immature Granulocytes: 0.02 10*3/uL (ref 0.00–0.07)
Basophils Absolute: 0.1 10*3/uL (ref 0.0–0.1)
Basophils Relative: 1 %
Eosinophils Absolute: 0.4 10*3/uL (ref 0.0–0.5)
Eosinophils Relative: 6 %
HCT: 44.2 % (ref 39.0–52.0)
Hemoglobin: 14.6 g/dL (ref 13.0–17.0)
Immature Granulocytes: 0 %
Lymphocytes Relative: 36 %
Lymphs Abs: 2.3 10*3/uL (ref 0.7–4.0)
MCH: 31.5 pg (ref 26.0–34.0)
MCHC: 33 g/dL (ref 30.0–36.0)
MCV: 95.5 fL (ref 80.0–100.0)
Monocytes Absolute: 0.5 10*3/uL (ref 0.1–1.0)
Monocytes Relative: 7 %
Neutro Abs: 3.2 10*3/uL (ref 1.7–7.7)
Neutrophils Relative %: 50 %
Platelet Count: 169 10*3/uL (ref 150–400)
RBC: 4.63 MIL/uL (ref 4.22–5.81)
RDW: 14.8 % (ref 11.5–15.5)
WBC Count: 6.5 10*3/uL (ref 4.0–10.5)
nRBC: 0 % (ref 0.0–0.2)

## 2020-11-19 LAB — CMP (CANCER CENTER ONLY)
ALT: 9 U/L (ref 0–44)
AST: 27 U/L (ref 15–41)
Albumin: 3.7 g/dL (ref 3.5–5.0)
Alkaline Phosphatase: 69 U/L (ref 38–126)
Anion gap: 8 (ref 5–15)
BUN: 20 mg/dL (ref 8–23)
CO2: 30 mmol/L (ref 22–32)
Calcium: 9 mg/dL (ref 8.9–10.3)
Chloride: 105 mmol/L (ref 98–111)
Creatinine: 1.24 mg/dL (ref 0.61–1.24)
GFR, Estimated: 57 mL/min — ABNORMAL LOW (ref >60)
Glucose, Bld: 82 mg/dL (ref 70–99)
Potassium: 4.2 mmol/L (ref 3.5–5.1)
Sodium: 143 mmol/L (ref 135–145)
Total Bilirubin: 0.5 mg/dL (ref 0.3–1.2)
Total Protein: 6.5 g/dL (ref 6.5–8.1)

## 2020-11-19 MED ORDER — IOHEXOL 300 MG/ML  SOLN
75.0000 mL | Freq: Once | INTRAMUSCULAR | Status: AC | PRN
  Administered 2020-11-19: 75 mL via INTRAVENOUS

## 2020-11-21 ENCOUNTER — Inpatient Hospital Stay: Payer: Medicare Other | Admitting: Internal Medicine

## 2020-11-21 ENCOUNTER — Other Ambulatory Visit: Payer: Self-pay

## 2020-11-21 ENCOUNTER — Telehealth: Payer: Self-pay | Admitting: Internal Medicine

## 2020-11-21 VITALS — BP 122/67 | HR 82 | Temp 97.8°F | Resp 19 | Ht 73.0 in | Wt 201.9 lb

## 2020-11-21 DIAGNOSIS — C3490 Malignant neoplasm of unspecified part of unspecified bronchus or lung: Secondary | ICD-10-CM

## 2020-11-21 DIAGNOSIS — I10 Essential (primary) hypertension: Secondary | ICD-10-CM

## 2020-11-21 DIAGNOSIS — C3492 Malignant neoplasm of unspecified part of left bronchus or lung: Secondary | ICD-10-CM

## 2020-11-21 DIAGNOSIS — Z85118 Personal history of other malignant neoplasm of bronchus and lung: Secondary | ICD-10-CM | POA: Diagnosis not present

## 2020-11-21 NOTE — Progress Notes (Signed)
[place] [phone]   [phone]  OFFICE PROGRESS NOTE  [name], Shyrl Numbers, MD [place] [address]  DIAGNOSIS: Stage IIA (T2b, N0, M0) non-small cell lung cancer consistent with squamous cell carcinoma diagnosed in January 2016  PRIOR THERAPY:  1) Status post left VATS with left lower lobectomy and mediastinal lymph node dissection under the care of Dr. Prescott Gum on 08/10/2014. 2) Adjuvant systemic chemotherapy with carboplatin for AUC of 5 on day 1 and gemcitabine 1000 MG/M2 on days 1 and 8 every 3 weeks. First dose 09/19/2014.  CURRENT THERAPY: Observation.  INTERVAL HISTORY: Jeremy Deleon 87 y.o. male returns to the clinic today for annual follow-up visit.  The patient is feeling fine today with no concerning complaints.  He denied having any current chest pain, shortness of breath, cough or hemoptysis.  He denied having any fever or chills.  He has no nausea, vomiting, diarrhea or constipation.  He has no headache or visual changes.  He has no weight loss or night sweats.  He is here today for evaluation with repeat CT scan of the chest for restaging of his disease.   MEDICAL HISTORY: Past Medical History:  Diagnosis Date  . BPH (benign prostatic hyperplasia)   . CAD S/P percutaneous coronary angioplasty 07/25/2004   Cardiologist-- Dr Ellyn Hack - [facility] Myoview with inferolateral ischemia: --> per cardiac cath 09-16-2004 -- 80% prox LAD lesion PCI: Taxus DES 3.0 mm x 16 mm-;; Myoview June 2009 no ischemia or infarction with attenuation artifact  . Chronic constipation   . Dyslipidemia, goal LDL below 70    on simvastatin and tricor  . Emphysema of lung ([place])   . Full dentures   . GERD (gastroesophageal reflux disease)   . Graves' disease 11/ 2016 dx   endocrinologist-  dr Renato Shin-- treated with tapazole until 2018 when labs were normal  . History of basal cell carcinoma (BCC) excision    . Hypertension   . Left hydrocele   . Mild atherosclerosis of both carotid arteries    duplex 09-09-2016  1-39% bilateral ICA  . Osteoarthritis   . S/P drug eluting coronary stent placement 09/16/2004   x1  to prox. LAD  . Squamous cell lung cancer : Left lower lobe 07/2014   - oncologist-  dr Julien Nordmann---  per lov note in epic no recurrence: s/p  Bronchoscopy showed extrinsic compression of the left lower lobe with endobronchial biopsies were negative. He subsequently had a needle biopsy which dx the mass as squamous cell carcinoma /  08-10-2014  s/p  Left VATS w/ LLlobectomy with node dissections and completed chemotherapy (11/2014)  . Wears glasses   . Wears hearing aid in both ears     ALLERGIES:  is allergic to niaspan [niacin er].  MEDICATIONS:  Current Outpatient Medications  Medication Sig Dispense Refill  . aspirin EC 81 MG tablet Take 81 mg by mouth daily.    . fenofibrate (TRICOR) 145 MG tablet TAKE 1 TABLET(145 MG) BY MOUTH DAILY 90 tablet 2  . isosorbide mononitrate (IMDUR) 30 MG 24 hr tablet TAKE 1 TABLET(30 MG) BY MOUTH DAILY 90 tablet 3  . levothyroxine (SYNTHROID, LEVOTHROID) 50 MCG tablet Take 1 tablet by mouth daily.    . rosuvastatin (CRESTOR) 40 MG tablet Take 1 tablet (40 mg total) by mouth daily. 90 tablet 3  . traMADol (ULTRAM) 50 MG tablet Take 50 mg by mouth 2 (two) times  daily as needed.     No current facility-administered medications for this visit.    SURGICAL HISTORY:  Past Surgical History:  Procedure Laterality Date  . APPENDECTOMY  child  . CATARACT EXTRACTION W/ INTRAOCULAR LENS  IMPLANT, BILATERAL  2016 & 2017  . HYDROCELE EXCISION Left 03/12/2018   Procedure: HYDROCELECTOMY ADULT;  Surgeon: Ardis Hughs, MD;  Location: [facility];  Service: Urology;  Laterality: Left;  . NM MYOVIEW LTD  January 06, 2008    treadmill  myoview - tissue attenuation but no ischemia or infarction  . PERCUTANEOUS CORONARY STENT INTERVENTION (PCI-S)   2/27/ 2006    dr Tami Ribas  @[facility]    (07-25-2004 positive cardiolite for ischemia)  balloon angioplasty and TAXUS DES 3.0 mm x 16 mm stent ->  PROX  LAD (80%)  . TRANSTHORACIC ECHOCARDIOGRAM  09/16/2016   Limited echo with bubble study: EF 60- 65%, GR 1 DD./ Negative bubble study/  mild AR/  mild dilated ascending aorta/ No regional wall motion abnormality  . UMBILICAL HERNIA REPAIR  11-23-2007    dr Rise Patience   @[facility]   . VIDEO ASSISTED THORACOSCOPY (VATS)/ LOBECTOMY Left 08/10/2014   Procedure: VIDEO ASSISTED THORACOSCOPY (VATS)/ LOBECTOMY;  Surgeon: Ivin Poot, MD;  Location: [place];  Service: Thoracic;  Laterality: Left;  Marland Kitchen VIDEO BRONCHOSCOPY N/A 08/10/2014   Procedure: VIDEO BRONCHOSCOPY;  Surgeon: Ivin Poot, MD;  Location: [facility] OR;  Service: Thoracic;  Laterality: N/A;    REVIEW OF SYSTEMS:  A comprehensive review of systems was negative.   PHYSICAL EXAMINATION: General appearance: alert, cooperative and no distress Head: Normocephalic, without obvious abnormality, atraumatic Neck: no adenopathy, no JVD, supple, symmetrical, trachea midline and thyroid not enlarged, symmetric, no tenderness/mass/nodules Lymph nodes: Cervical, supraclavicular, and axillary nodes normal. Resp: clear to auscultation bilaterally Back: symmetric, no curvature. ROM normal. No CVA tenderness. Cardio: normal apical impulse GI: soft, non-tender; bowel sounds normal; no masses,  no organomegaly Extremities: extremities normal, atraumatic, no cyanosis or edema  ECOG PERFORMANCE STATUS: 1 - Symptomatic but completely ambulatory  Blood pressure 122/67, pulse 82, temperature 97.8 F (36.6 C), temperature source Tympanic, resp. rate 19, height 6\' 1"  (1.854 m), weight 201 lb 14.4 oz (91.6 kg), SpO2 97 %.  LABORATORY DATA: Lab Results  Component Value Date   WBC 6.5 11/19/2020   HGB 14.6 11/19/2020   HCT 44.2 11/19/2020   MCV 95.5 11/19/2020   PLT 169 11/19/2020      Chemistry      Component Value  Date/Time   NA 143 11/19/2020 1146   NA 140 11/22/2019 0842   NA 141 03/24/2017 1018   K 4.2 11/19/2020 1146   K 4.2 03/24/2017 1018   CL 105 11/19/2020 1146   CO2 30 11/19/2020 1146   CO2 25 03/24/2017 1018   BUN 20 11/19/2020 1146   BUN 21 11/22/2019 0842   BUN 16.5 03/24/2017 1018   CREATININE 1.24 11/19/2020 1146   CREATININE 1.2 03/24/2017 1018      Component Value Date/Time   CALCIUM 9.0 11/19/2020 1146   CALCIUM 9.6 03/24/2017 1018   ALKPHOS 69 11/19/2020 1146   ALKPHOS 87 03/24/2017 1018   AST 27 11/19/2020 1146   AST 30 03/24/2017 1018   ALT 9 11/19/2020 1146   ALT 17 03/24/2017 1018   BILITOT 0.5 11/19/2020 1146   BILITOT 0.56 03/24/2017 1018       RADIOGRAPHIC STUDIES: CT Chest W Contrast  Result Date: 11/20/2020 CLINICAL DATA:  LEFT  lower lobectomy 2016. Non-small cell lung cancer. EXAM: CT CHEST WITH CONTRAST TECHNIQUE: Multidetector CT imaging of the chest was performed during intravenous contrast administration. CONTRAST:  80mL OMNIPAQUE IOHEXOL 300 MG/ML  SOLN COMPARISON:  Chest CT 11/17/2019 FINDINGS: Cardiovascular: Truncation of the lower lobe pulmonary artery consistent with lobectomy. Coronary artery calcification and aortic atherosclerotic calcification. Mediastinum/Nodes: No axillary or supraclavicular adenopathy. No mediastinal or hilar adenopathy. No pericardial fluid. Esophagus normal. Lungs/Pleura: Postsurgical change consistent LEFT lower lobectomy. No nodularity. Benign calcified granulomas in the RIGHT upper lobe and RIGHT lower lobe. Upper Abdomen: Limited view of the liver, kidneys, pancreas are unremarkable. Normal adrenal glands. Gallstones noted. Renal cortical thinning. Musculoskeletal: No aggressive osseous lesion. Degenerative osteophytosis of the spine. IMPRESSION: 1. Post LEFT lower lobectomy. No evidence of lung cancer recurrence. 2. No mediastinal adenopathy. 3. Coronary artery calcification and Aortic Atherosclerosis (ICD10-I70.0).  Electronically Signed   By: Suzy Bouchard M.D.   On: 11/20/2020 08:42    ASSESSMENT AND PLAN:  This is a very pleasant 87 years old white male with history of stage IIA non-small cell lung cancer, squamous cell carcinoma status post left lower lobectomy with lymph node dissection followed by adjuvant systemic chemotherapy with carboplatin and gemcitabine for 4 cycles. The patient has been observation since June 2016. The patient has been on observation since that time and he is feeling fine today with no concerning complaints. He had repeat CT scan of the chest performed recently.  I personally and independently reviewed the scans and discussed the results with the patient today. His scan showed no concerning findings for disease recurrence or metastasis. I recommended for the patient to continue on observation with repeat CT scan of the chest in 6 months. He was advised to call immediately if he has any concerning symptoms in the interval. The patient voices understanding of current disease status and treatment options and is in agreement with the current care plan. All questions were answered. The patient knows to call the clinic with any problems, questions or concerns. We can certainly see the patient much sooner if necessary.  Disclaimer: This note was dictated with voice recognition software. Similar sounding words can inadvertently be transcribed and may not be corrected upon review.

## 2020-11-21 NOTE — Telephone Encounter (Signed)
Scheduled follow-up appointments per 5/4 los. Patient is aware.

## 2020-11-24 ENCOUNTER — Other Ambulatory Visit: Payer: Self-pay | Admitting: Cardiology

## 2020-11-25 ENCOUNTER — Emergency Department (HOSPITAL_BASED_OUTPATIENT_CLINIC_OR_DEPARTMENT_OTHER)
Admission: EM | Admit: 2020-11-25 | Discharge: 2020-11-25 | Disposition: A | Discharge status: Home / Self Care | Payer: Medicare Other | Attending: Emergency Medicine | Admitting: Emergency Medicine

## 2020-11-25 ENCOUNTER — Encounter (HOSPITAL_BASED_OUTPATIENT_CLINIC_OR_DEPARTMENT_OTHER): Payer: Self-pay | Admitting: Emergency Medicine

## 2020-11-25 ENCOUNTER — Other Ambulatory Visit: Payer: Self-pay

## 2020-11-25 DIAGNOSIS — Z85118 Personal history of other malignant neoplasm of bronchus and lung: Secondary | ICD-10-CM | POA: Insufficient documentation

## 2020-11-25 DIAGNOSIS — Z7982 Long term (current) use of aspirin: Secondary | ICD-10-CM | POA: Insufficient documentation

## 2020-11-25 DIAGNOSIS — Z79899 Other long term (current) drug therapy: Secondary | ICD-10-CM | POA: Insufficient documentation

## 2020-11-25 DIAGNOSIS — I10 Essential (primary) hypertension: Secondary | ICD-10-CM | POA: Insufficient documentation

## 2020-11-25 DIAGNOSIS — I2510 Atherosclerotic heart disease of native coronary artery without angina pectoris: Secondary | ICD-10-CM | POA: Diagnosis not present

## 2020-11-25 DIAGNOSIS — S40862A Insect bite (nonvenomous) of left upper arm, initial encounter: Secondary | ICD-10-CM | POA: Diagnosis present

## 2020-11-25 DIAGNOSIS — W57XXXA Bitten or stung by nonvenomous insect and other nonvenomous arthropods, initial encounter: Secondary | ICD-10-CM | POA: Diagnosis not present

## 2020-11-25 DIAGNOSIS — Z87891 Personal history of nicotine dependence: Secondary | ICD-10-CM | POA: Insufficient documentation

## 2020-11-25 MED ORDER — DOXYCYCLINE HYCLATE 100 MG PO TABS
100.0000 mg | ORAL_TABLET | Freq: Once | ORAL | Status: AC
  Administered 2020-11-25: 100 mg via ORAL
  Filled 2020-11-25: qty 1

## 2020-11-25 MED ORDER — HYDROCORTISONE 1 % EX CREA: TOPICAL_CREAM | CUTANEOUS | 0 refills | Status: DC

## 2020-11-25 MED ORDER — DOXYCYCLINE HYCLATE 100 MG PO CAPS: 100.0000 mg | ORAL_CAPSULE | Freq: Two times a day (BID) | ORAL | 0 refills | Status: DC

## 2020-11-25 NOTE — ED Triage Notes (Signed)
Pt arrives pov with c/o rash on left arm. Pt reports concern for possible insect bite. Circular redness noted to medial, proximal RUE. Pt reports itching

## 2020-11-25 NOTE — Discharge Instructions (Addendum)
It looks like you were bit by something and now you are having a reaction.  We will give an antibiotic too to make sure no bacterial infection.

## 2020-11-25 NOTE — ED Notes (Signed)
ED Provider at bedside. 

## 2020-11-25 NOTE — ED Notes (Signed)
Pt discharged to home. Discharge instructions have been discussed with patient and/or family members. Pt verbally acknowledges understanding d/c instructions, and endorses comprehension to checkout at registration before leaving.  °

## 2020-11-25 NOTE — ED Provider Notes (Signed)
[place] EMERGENCY DEPARTMENT Provider Note   CSN: [id number] Arrival date & time: 11/25/20  0951     History Chief Complaint  Patient presents with  . Rash    Jeremy Deleon is a 87 y.o. male.  The history is provided by the patient.  Rash Location:  Shoulder/arm Shoulder/arm rash location:  L arm Quality: itchiness, redness and swelling   Severity:  Moderate Onset quality:  Gradual Duration:  12 days Timing:  Constant Progression:  Worsening Chronicity:  New Context comment:  Thinks he may have been bit by something but didn't see anything Relieved by:  None tried Worsened by:  Nothing Ineffective treatments:  None tried Associated symptoms: induration   Associated symptoms: no abdominal pain, no fatigue, no fever, no joint pain and no nausea        Past Medical History:  Diagnosis Date  . BPH (benign prostatic hyperplasia)   . CAD S/P percutaneous coronary angioplasty 07/25/2004   Cardiologist-- Dr Ellyn Hack - [facility] Myoview with inferolateral ischemia: --> per cardiac cath 09-16-2004 -- 80% prox LAD lesion PCI: Taxus DES 3.0 mm x 16 mm-;; Myoview June 2009 no ischemia or infarction with attenuation artifact  . Chronic constipation   . Dyslipidemia, goal LDL below 70    on simvastatin and tricor  . Emphysema of lung ([place])   . Full dentures   . GERD (gastroesophageal reflux disease)   . Graves' disease 11/ 2016 dx   endocrinologist-  dr Renato Shin-- treated with tapazole until 2018 when labs were normal  . History of basal cell carcinoma (BCC) excision   . Hypertension   . Left hydrocele   . Mild atherosclerosis of both carotid arteries    duplex 09-09-2016  1-39% bilateral ICA  . Osteoarthritis   . S/P drug eluting coronary stent placement 09/16/2004   x1  to prox. LAD  . Squamous cell lung cancer : Left lower lobe 07/2014   - oncologist-  dr Julien Nordmann---  per lov note in epic no recurrence: s/p  Bronchoscopy showed extrinsic compression  of the left lower lobe with endobronchial biopsies were negative. He subsequently had a needle biopsy which dx the mass as squamous cell carcinoma /  08-10-2014  s/p  Left VATS w/ LLlobectomy with node dissections and completed chemotherapy (11/2014)  . Wears glasses   . Wears hearing aid in both ears     Patient Active Problem List   Diagnosis Date Noted  . Syncope and collapse 02/13/2019  . Swelling of lower leg 09/14/2017  . Retinal artery plaque 09/01/2016  . Hyperthyroidism 07/26/2015  . Full code status 04/23/2015  . Orthostatic hypotension; with baseline hypotension 11/13/2014  . Cellulitis 10/17/2014  . Chemotherapy induced neutropenia ([place]) 10/17/2014  . S/P lobectomy of lung 08/10/2014  . Squamous cell lung cancer : Left lower lobe  08/03/2014  . Dyslipidemia, goal LDL below 70 10/25/2013  . CAD S/P percutaneous coronary angioplasty -- Taxus DES in Prox LAD 10/25/2013  . Essential hypertension     Past Surgical History:  Procedure Laterality Date  . APPENDECTOMY  child  . CATARACT EXTRACTION W/ INTRAOCULAR LENS  IMPLANT, BILATERAL  2016 & 2017  . HYDROCELE EXCISION Left 03/12/2018   Procedure: HYDROCELECTOMY ADULT;  Surgeon: Ardis Hughs, MD;  Location: [facility];  Service: Urology;  Laterality: Left;  . NM MYOVIEW LTD  January 06, 2008    treadmill  myoview - tissue attenuation but no ischemia or infarction  .  PERCUTANEOUS CORONARY STENT INTERVENTION (PCI-S)  2/27/ 2006    dr Tami Ribas  @[facility]    (07-25-2004 positive cardiolite for ischemia)  balloon angioplasty and TAXUS DES 3.0 mm x 16 mm stent ->  PROX  LAD (80%)  . TRANSTHORACIC ECHOCARDIOGRAM  09/16/2016   Limited echo with bubble study: EF 60- 65%, GR 1 DD./ Negative bubble study/  mild AR/  mild dilated ascending aorta/ No regional wall motion abnormality  . UMBILICAL HERNIA REPAIR  11-23-2007    dr Rise Patience   @[facility]   . VIDEO ASSISTED THORACOSCOPY (VATS)/ LOBECTOMY Left 08/10/2014   Procedure: VIDEO  ASSISTED THORACOSCOPY (VATS)/ LOBECTOMY;  Surgeon: Ivin Poot, MD;  Location: [place];  Service: Thoracic;  Laterality: Left;  Marland Kitchen VIDEO BRONCHOSCOPY N/A 08/10/2014   Procedure: VIDEO BRONCHOSCOPY;  Surgeon: Ivin Poot, MD;  Location: [facility] OR;  Service: Thoracic;  Laterality: N/A;       Family History  Problem Relation Age of Onset  . Heart disease Mother   . Lung cancer Mother   . Heart disease Father   . Parkinsonism Brother   . Thyroid disease Neg Hx     Social History   Tobacco Use  . Smoking status: Former Smoker    Years: 20.00    Types: Cigarettes    Quit date: 10/25/1982    Years since quitting: 38.1  . Smokeless tobacco: Never Used  Vaping Use  . Vaping Use: Never used  Substance Use Topics  . Alcohol use: No  . Drug use: No    Home Medications Prior to Admission medications   Medication Sig Start Date End Date Taking? Authorizing Provider  doxycycline (VIBRAMYCIN) 100 MG capsule Take 1 capsule (100 mg total) by mouth 2 (two) times daily. 11/25/20  Yes Lynne Righi, Loree Fee, MD  hydrocortisone cream 1 % Apply to affected area 2 times daily 11/25/20  Yes Blanchie Dessert, MD  aspirin EC 81 MG tablet Take 81 mg by mouth daily.    [provider]  fenofibrate (TRICOR) 145 MG tablet TAKE 1 TABLET(145 MG) BY MOUTH DAILY 10/17/20   Leonie Man, MD  isosorbide mononitrate (IMDUR) 30 MG 24 hr tablet TAKE 1 TABLET(30 MG) BY MOUTH DAILY 11/23/19   Leonie Man, MD  levothyroxine (SYNTHROID, LEVOTHROID) 50 MCG tablet Take 1 tablet by mouth daily. 06/28/18   [provider]  rosuvastatin (CRESTOR) 40 MG tablet Take 1 tablet (40 mg total) by mouth daily. 12/13/19 03/12/20  Leonie Man, MD  traMADol (ULTRAM) 50 MG tablet Take 50 mg by mouth 2 (two) times daily as needed. 10/20/19   [provider]    Allergies    Niaspan [niacin er]  Review of Systems   Review of Systems  Constitutional: Negative for fatigue and fever.  Gastrointestinal:  Negative for abdominal pain and nausea.  Musculoskeletal: Negative for arthralgias.  Skin: Positive for rash.  All other systems reviewed and are negative.   Physical Exam Updated Vital Signs BP 136/80 (BP Location: Right Arm)   Pulse 70   Temp (!) 97.5 F (36.4 C) (Axillary)   Resp 16   Ht 6\' 1"  (1.854 m)   Wt 91.2 kg   SpO2 99%   BMI 26.52 kg/m   Physical Exam Vitals and nursing note reviewed.  Constitutional:      General: He is not in acute distress.    Appearance: Normal appearance. He is normal weight.  HENT:     Head: Normocephalic and atraumatic.  Nose: Nose normal.  Eyes:     Pupils: Pupils are equal, round, and reactive to light.  Cardiovascular:     Rate and Rhythm: Normal rate.  Pulmonary:     Effort: Pulmonary effort is normal.  Musculoskeletal:        General: No swelling.  Skin:    General: Skin is warm.     Findings: Rash present.       Neurological:     Mental Status: He is alert. Mental status is at baseline.  Psychiatric:        Mood and Affect: Mood normal.        Behavior: Behavior normal.     ED Results / Procedures / Treatments   Labs (all labs ordered are listed, but only abnormal results are displayed) Labs Reviewed - No data to display  EKG None  Radiology No results found.  Procedures Procedures   Medications Ordered in ED Medications  doxycycline (VIBRA-TABS) tablet 100 mg (has no administration in time range)    ED Course  I have reviewed the triage vital signs and the nursing notes.  Pertinent labs & imaging results that were available during my care of the patient were reviewed by me and considered in my medical decision making (see chart for details).    MDM Rules/Calculators/A&P                          Pt with signs of most likely insect bite with localized reaction today but given pustule in the center and significant erythema and warmth will treat with doxy but pt also to use hydrocortisone.  Final  Clinical Impression(s) / ED Diagnoses Final diagnoses:  Insect bite of left upper arm with local reaction, initial encounter    Rx / DC Orders ED Discharge Orders         Ordered    doxycycline (VIBRAMYCIN) 100 MG capsule  2 times daily        11/25/20 1012    hydrocortisone cream 1 %        11/25/20 1012           Blanchie Dessert, MD 11/25/20 1030

## 2020-12-18 ENCOUNTER — Other Ambulatory Visit: Payer: Self-pay | Admitting: Cardiology

## 2021-02-11 ENCOUNTER — Telehealth: Payer: Self-pay

## 2021-02-11 NOTE — Telephone Encounter (Signed)
Received a call from the patient stating he is not sure if he needs to take this medication because its not on his list, Finasteride. He states he does not know if he needs to continue taking this medication.   I reviewed the patients chart and did not see the medication on the patients current medication list. I advised patient that I would send Dr Allison Quarry nurse a message and she could let him know if he was supposed to take this medication. Patient voiced understanding.

## 2021-02-15 NOTE — Telephone Encounter (Signed)
Spoke to patient. Informed patient this medication would not have been prescribed by Dr Ellyn Hack.   RN asked if he spelled the medication to the [place]  with  last telephone message he said he did. Patient states he is not at home where the bottle is to look on it to see who prescribed medication. Patient states he will look when home and contact the provider

## 2021-05-01 ENCOUNTER — Ambulatory Visit: Payer: Medicare Other | Admitting: Dermatology

## 2021-05-01 ENCOUNTER — Other Ambulatory Visit: Payer: Self-pay

## 2021-05-01 ENCOUNTER — Encounter: Payer: Self-pay | Admitting: Dermatology

## 2021-05-01 DIAGNOSIS — D044 Carcinoma in situ of skin of scalp and neck: Secondary | ICD-10-CM

## 2021-05-01 DIAGNOSIS — Z1283 Encounter for screening for malignant neoplasm of skin: Secondary | ICD-10-CM | POA: Diagnosis not present

## 2021-05-01 DIAGNOSIS — Z85828 Personal history of other malignant neoplasm of skin: Secondary | ICD-10-CM

## 2021-05-01 DIAGNOSIS — D485 Neoplasm of uncertain behavior of skin: Secondary | ICD-10-CM

## 2021-05-01 DIAGNOSIS — D0439 Carcinoma in situ of skin of other parts of face: Secondary | ICD-10-CM

## 2021-05-01 NOTE — Patient Instructions (Signed)

## 2021-05-06 ENCOUNTER — Telehealth: Payer: Self-pay | Admitting: *Deleted

## 2021-05-06 NOTE — Telephone Encounter (Signed)
Pathology results to patient- surgery appointment scheduled.

## 2021-05-17 ENCOUNTER — Encounter: Payer: Self-pay | Admitting: Dermatology

## 2021-05-17 NOTE — Progress Notes (Signed)
Follow-Up Visit   Subjective  Jeremy Deleon is a 87 y.o. male who presents for the following: Annual Exam (Patient has history of bcc and mohs surgery right temple has scale present today, rash has pretty much self resolved with pcp treatment of doxycycline and prednisone ).  Patient  had a rash that cleared with doxycycline plus prednisone.  Concern with crusts on right temple near site of Mohs surgery. Location:  Duration:  Quality:  Associated Signs/Symptoms: Modifying Factors:  Severity:  Timing: Context:   Objective  Well appearing patient in no apparent distress; mood and affect are within normal limits. Right Temporal Scalp       Right Zygomatic Area       Right Sideburn         All skin waist up examined.   Assessment & Plan    Neoplasm of uncertain behavior of skin (3) Right Temporal Scalp  Skin / nail biopsy Type of biopsy: tangential   Informed consent: discussed and consent obtained   Timeout: patient name, date of birth, surgical site, and procedure verified   Procedure prep:  Patient was prepped and draped in usual sterile fashion (Non sterile) Prep type:  Chlorhexidine Anesthesia: the lesion was anesthetized in a standard fashion   Anesthetic:  1% lidocaine w/ epinephrine 1-100,000 local infiltration Instrument used: flexible razor blade   Hemostasis achieved with: ferric subsulfate   Outcome: patient tolerated procedure well   Post-procedure details: sterile dressing applied and wound care instructions given   Dressing type: bandage and petrolatum    Specimen 2 - Surgical pathology Differential Diagnosis: R/O BCC vs SCC  Check Margins: No  Right Zygomatic Area  Skin / nail biopsy Type of biopsy: tangential   Informed consent: discussed and consent obtained   Timeout: patient name, date of birth, surgical site, and procedure verified   Procedure prep:  Patient was prepped and draped in usual sterile fashion (Non sterile) Prep  type:  Chlorhexidine Anesthesia: the lesion was anesthetized in a standard fashion   Anesthetic:  1% lidocaine w/ epinephrine 1-100,000 local infiltration Instrument used: flexible razor blade   Hemostasis achieved with: ferric subsulfate   Outcome: patient tolerated procedure well   Post-procedure details: sterile dressing applied and wound care instructions given   Dressing type: bandage and petrolatum    Specimen 3 - Surgical pathology Differential Diagnosis: R/O BCC vs SCC  Check Margins: No  Right Sideburn  Skin / nail biopsy Type of biopsy: tangential   Informed consent: discussed and consent obtained   Timeout: patient name, date of birth, surgical site, and procedure verified   Procedure prep:  Patient was prepped and draped in usual sterile fashion (Non sterile) Prep type:  Chlorhexidine Anesthesia: the lesion was anesthetized in a standard fashion   Anesthetic:  1% lidocaine w/ epinephrine 1-100,000 local infiltration Instrument used: flexible razor blade   Hemostasis achieved with: ferric subsulfate   Outcome: patient tolerated procedure well   Post-procedure details: sterile dressing applied and wound care instructions given   Dressing type: bandage and petrolatum    Specimen 1 - Surgical pathology Differential Diagnosis: R/O BCC vs SCC  Check Margins: No   Addendum: Although there is not as active rash today to even speculate on possible margin, I did tell patient that clearing on prednisone can sometimes be transient and followed by a flare.  He will contact me if this happens.   I, Lavonna Monarch, MD, have reviewed all documentation for this visit.  The documentation on 05/17/21 for the exam, diagnosis, procedures, and orders are all accurate and complete.

## 2021-05-23 ENCOUNTER — Other Ambulatory Visit: Payer: Self-pay

## 2021-05-23 ENCOUNTER — Ambulatory Visit (INDEPENDENT_AMBULATORY_CARE_PROVIDER_SITE_OTHER): Payer: Medicare Other | Admitting: Dermatology

## 2021-05-23 DIAGNOSIS — D044 Carcinoma in situ of skin of scalp and neck: Secondary | ICD-10-CM

## 2021-05-23 DIAGNOSIS — B029 Zoster without complications: Secondary | ICD-10-CM

## 2021-05-23 DIAGNOSIS — D099 Carcinoma in situ, unspecified: Secondary | ICD-10-CM

## 2021-05-23 DIAGNOSIS — D0461 Carcinoma in situ of skin of right upper limb, including shoulder: Secondary | ICD-10-CM

## 2021-05-23 MED ORDER — VALACYCLOVIR HCL 1 G PO TABS: 1000.0000 mg | ORAL_TABLET | Freq: Two times a day (BID) | ORAL | 0 refills | Status: DC

## 2021-05-23 MED ORDER — CLOBETASOL PROPIONATE 0.05 % EX CREA: 1.0000 "application " | TOPICAL_CREAM | Freq: Two times a day (BID) | CUTANEOUS | 0 refills | Status: AC

## 2021-05-23 NOTE — Patient Instructions (Signed)

## 2021-05-27 ENCOUNTER — Telehealth: Payer: Self-pay | Admitting: Dermatology

## 2021-05-27 NOTE — Telephone Encounter (Signed)
Message for ST: the salve and pill worked well on his chest and it's just about cleared up about 99%. He wants to know if he should continue using them or stop. Please call him and let him know.

## 2021-05-27 NOTE — Telephone Encounter (Signed)
99 % better taper off the the topical every other day three times a week 2 times a week discontinue.

## 2021-06-12 ENCOUNTER — Encounter: Payer: Self-pay | Admitting: Dermatology

## 2021-06-13 NOTE — Progress Notes (Signed)
   Follow-Up Visit   Subjective  Jeremy Deleon is a 87 y.o. male who presents for the following: Procedure (Here for CIS x 3 ).  This is scheduled to treat 3 biopsy-proven skin cancers new onset syncope, itchy rash left chest and side for a couple of days. Location:  Duration:  Quality:  Associated Signs/Symptoms: Modifying Factors:  Severity:  Timing: Context:   Objective  Well appearing patient in no apparent distress; mood and affect are within normal limits. Left Breast New onset left T3 dermatomal swollen 1 to 3 cm patches without classic clustered vesicles.  one area somewhat linear (see photo) and could represent acute contact allergic dermatitis, but there is no known history of any exposures.  Slight hyperesthesia upper back.  We will cover the possibility of shingles for minimal of 3 days with an oral valacyclovir.     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        I, Lavonna Monarch, MD, have reviewed all documentation for this visit.  The documentation on 06/13/21 for the exam, diagnosis, procedures, and orders are all accurate and complete.

## 2021-06-26 ENCOUNTER — Emergency Department (HOSPITAL_BASED_OUTPATIENT_CLINIC_OR_DEPARTMENT_OTHER)
Admission: EM | Admit: 2021-06-26 | Discharge: 2021-06-26 | Disposition: A | Discharge status: Home / Self Care | Payer: Medicare Other | Attending: Emergency Medicine | Admitting: Emergency Medicine

## 2021-06-26 ENCOUNTER — Encounter (HOSPITAL_BASED_OUTPATIENT_CLINIC_OR_DEPARTMENT_OTHER): Payer: Self-pay | Admitting: *Deleted

## 2021-06-26 ENCOUNTER — Other Ambulatory Visit: Payer: Self-pay

## 2021-06-26 DIAGNOSIS — Z79899 Other long term (current) drug therapy: Secondary | ICD-10-CM | POA: Insufficient documentation

## 2021-06-26 DIAGNOSIS — Z85118 Personal history of other malignant neoplasm of bronchus and lung: Secondary | ICD-10-CM | POA: Diagnosis not present

## 2021-06-26 DIAGNOSIS — I10 Essential (primary) hypertension: Secondary | ICD-10-CM | POA: Diagnosis not present

## 2021-06-26 DIAGNOSIS — L03113 Cellulitis of right upper limb: Secondary | ICD-10-CM | POA: Insufficient documentation

## 2021-06-26 DIAGNOSIS — Z85828 Personal history of other malignant neoplasm of skin: Secondary | ICD-10-CM | POA: Diagnosis not present

## 2021-06-26 DIAGNOSIS — Z7982 Long term (current) use of aspirin: Secondary | ICD-10-CM | POA: Diagnosis not present

## 2021-06-26 DIAGNOSIS — Z87891 Personal history of nicotine dependence: Secondary | ICD-10-CM | POA: Insufficient documentation

## 2021-06-26 DIAGNOSIS — R21 Rash and other nonspecific skin eruption: Secondary | ICD-10-CM | POA: Diagnosis present

## 2021-06-26 DIAGNOSIS — I2510 Atherosclerotic heart disease of native coronary artery without angina pectoris: Secondary | ICD-10-CM | POA: Diagnosis not present

## 2021-06-26 MED ORDER — SULFAMETHOXAZOLE-TRIMETHOPRIM 800-160 MG PO TABS: 1.0000 | ORAL_TABLET | Freq: Two times a day (BID) | ORAL | 0 refills | Status: AC

## 2021-06-26 MED ORDER — PREDNISONE 50 MG PO TABS: 50.0000 mg | ORAL_TABLET | Freq: Every day | ORAL | 0 refills | Status: AC

## 2021-06-26 NOTE — ED Provider Notes (Signed)
[facility] Provider Note   CSN: [id number] Arrival date & time: 06/26/21  1343     History Chief Complaint  Patient presents with   Rash    Jeremy Deleon is a 87 y.o. male with known history of coronary artery disease, hypertension, hyperlipidemia and squamous cell lung cancer who presents for evaluation of a rash on the right forearm that extends all the way up towards the right shoulder.  Patient states that he first noticed it 3 days ago as a small area on his forearm.  Since then has been spreading both distally and proximally up his arm.  He states the area is warm, tender and very pruritic.  She denies any new soaps, lotions or detergents.  No changes in medications.  No changes in diet.  Triggers fevers or chills.  He states this is happened twice recently once on his left chest and once on his left arm that is managed with his primary care doctor.  At the time he was diagnosed with cellulitis and given Keflex and prednisone which he responded to favorably.  Today patient states that he does not wish to take prednisone because he does not like the way it makes him feel.   Rash Associated symptoms: no abdominal pain, no fever, no headaches, no shortness of breath and not vomiting       Past Medical History:  Diagnosis Date   Basal cell carcinoma    BPH (benign prostatic hyperplasia)    CAD S/P percutaneous coronary angioplasty 07/25/2004   Cardiologist-- Dr Ellyn Hack - Jan '06Abnormal Myoview with inferolateral ischemia: --> per cardiac cath 09-16-2004 -- 80% prox LAD lesion PCI: Taxus DES 3.0 mm x 16 mm-;; Myoview June 2009 no ischemia or infarction with attenuation artifact   Chronic constipation    Dyslipidemia, goal LDL below 70    on simvastatin and tricor   Emphysema of lung ([place])    Full dentures    GERD (gastroesophageal reflux disease)    Graves' disease 11/ 2016 dx   endocrinologist-  dr Renato Shin-- treated with tapazole until 2018  when labs were normal   History of basal cell carcinoma (BCC) excision    Hypertension    Left hydrocele    Mild atherosclerosis of both carotid arteries    duplex 09-09-2016  1-39% bilateral ICA   Osteoarthritis    S/P drug eluting coronary stent placement 09/16/2004   x1  to prox. LAD   Squamous cell lung cancer : Left lower lobe 07/2014   - oncologist-  dr Julien Nordmann---  per lov note in epic no recurrence: s/p  Bronchoscopy showed extrinsic compression of the left lower lobe with endobronchial biopsies were negative. He subsequently had a needle biopsy which dx the mass as squamous cell carcinoma /  08-10-2014  s/p  Left VATS w/ LLlobectomy with node dissections and completed chemotherapy (11/2014)   Wears glasses    Wears hearing aid in both ears     Patient Active Problem List   Diagnosis Date Noted   Syncope and collapse 02/13/2019   Swelling of lower leg 09/14/2017   Retinal artery plaque 09/01/2016   Hyperthyroidism 07/26/2015   Full code status 04/23/2015   Orthostatic hypotension; with baseline hypotension 11/13/2014   Cellulitis 10/17/2014   Chemotherapy induced neutropenia ([place]) 10/17/2014   S/P lobectomy of lung 08/10/2014   Squamous cell lung cancer : Left lower lobe  08/03/2014   Dyslipidemia, goal LDL below 70 10/25/2013   CAD  S/P percutaneous coronary angioplasty -- Taxus DES in Prox LAD 10/25/2013   Essential hypertension     Past Surgical History:  Procedure Laterality Date   APPENDECTOMY  child   CATARACT EXTRACTION W/ INTRAOCULAR LENS  IMPLANT, BILATERAL  2016 & 2017   HYDROCELE EXCISION Left 03/12/2018   Procedure: HYDROCELECTOMY ADULT;  Surgeon: Ardis Hughs, MD;  Location: [facility];  Service: Urology;  Laterality: Left;   NM MYOVIEW LTD  January 06, 2008    treadmill  myoview - tissue attenuation but no ischemia or infarction   PERCUTANEOUS CORONARY STENT INTERVENTION (PCI-S)  2/27/ 2006    dr Tami Ribas  @[facility]    (07-25-2004 positive  cardiolite for ischemia)  balloon angioplasty and TAXUS DES 3.0 mm x 16 mm stent ->  PROX  LAD (80%)   TRANSTHORACIC ECHOCARDIOGRAM  09/16/2016   Limited echo with bubble study: EF 60- 65%, GR 1 DD./ Negative bubble study/  mild AR/  mild dilated ascending aorta/ No regional wall motion abnormality   UMBILICAL HERNIA REPAIR  11-23-2007    dr Rise Patience   @[facility]    VIDEO ASSISTED THORACOSCOPY (VATS)/ LOBECTOMY Left 08/10/2014   Procedure: VIDEO ASSISTED THORACOSCOPY (VATS)/ LOBECTOMY;  Surgeon: Ivin Poot, MD;  Location: [facility];  Service: Thoracic;  Laterality: Left;   VIDEO BRONCHOSCOPY N/A 08/10/2014   Procedure: VIDEO BRONCHOSCOPY;  Surgeon: Ivin Poot, MD;  Location: [facility] OR;  Service: Thoracic;  Laterality: N/A;       Family History  Problem Relation Age of Onset   Heart disease Mother    Lung cancer Mother    Heart disease Father    Parkinsonism Brother    Thyroid disease Neg Hx     Social History   Tobacco Use   Smoking status: Former    Years: 20.00    Types: Cigarettes    Quit date: 10/25/1982    Years since quitting: 38.6   Smokeless tobacco: Never  Vaping Use   Vaping Use: Never used  Substance Use Topics   Alcohol use: No   Drug use: No    Home Medications Prior to Admission medications   Medication Sig Start Date End Date Taking? Authorizing Provider  aspirin EC 81 MG tablet Take 81 mg by mouth daily.    [provider]  clobetasol cream (TEMOVATE) 1.61 % Apply 1 application topically 2 (two) times daily. 05/23/21   Lavonna Monarch, MD  doxycycline (VIBRAMYCIN) 100 MG capsule Take 1 capsule (100 mg total) by mouth 2 (two) times daily. 11/25/20   Blanchie Dessert, MD  fenofibrate (TRICOR) 145 MG tablet TAKE 1 TABLET(145 MG) BY MOUTH DAILY 10/17/20   Leonie Man, MD  fenofibrate (TRICOR) 145 MG tablet Take by mouth.    [provider]  finasteride (PROSCAR) 5 MG tablet  01/08/21   [provider]  hydrocortisone cream 1 % Apply to  affected area 2 times daily 11/25/20   Blanchie Dessert, MD  isosorbide mononitrate (IMDUR) 30 MG 24 hr tablet TAKE 1 TABLET(30 MG) BY MOUTH DAILY 11/26/20   Leonie Man, MD  levothyroxine (SYNTHROID, LEVOTHROID) 50 MCG tablet Take 1 tablet by mouth daily. 06/28/18   [provider]  rosuvastatin (CRESTOR) 40 MG tablet TAKE 1 TABLET(40 MG) BY MOUTH DAILY 12/18/20   Leonie Man, MD  traMADol (ULTRAM) 50 MG tablet Take 50 mg by mouth 2 (two) times daily as needed. 10/20/19   [provider]  valACYclovir (VALTREX) 1000 MG tablet Take  1 tablet (1,000 mg total) by mouth 2 (two) times daily. 1 po tid 05/23/21   Lavonna Monarch, MD    Allergies    Niaspan [niacin er]  Review of Systems   Review of Systems  Constitutional:  Negative for fever.  HENT: Negative.    Eyes: Negative.   Respiratory:  Negative for shortness of breath.   Cardiovascular: Negative.   Gastrointestinal:  Negative for abdominal pain and vomiting.  Endocrine: Negative.   Genitourinary: Negative.   Musculoskeletal: Negative.   Skin:  Positive for rash.  Neurological:  Negative for headaches.  All other systems reviewed and are negative.  Physical Exam Updated Vital Signs BP 110/61 (BP Location: Right Arm)   Pulse 78   Temp (!) 97.5 F (36.4 C) (Oral)   Resp 20   Ht 6' (1.829 m)   Wt 98 kg   SpO2 98%   BMI 29.29 kg/m   Physical Exam Vitals and nursing note reviewed.  Constitutional:      General: He is not in acute distress.    Appearance: He is not ill-appearing or toxic-appearing.  HENT:     Head: Atraumatic.     Nose: Nose normal.     Mouth/Throat:     Mouth: Mucous membranes are moist.  Cardiovascular:     Rate and Rhythm: Normal rate.  Pulmonary:     Effort: Pulmonary effort is normal. No respiratory distress.  Abdominal:     General: There is no distension.  Musculoskeletal:        General: Normal range of motion.     Cervical back: Normal range of motion.  Skin:     General: Skin is warm.     Capillary Refill: Capillary refill takes less than 2 seconds.     Findings: Erythema present.     Comments: Erythematous, confluent indurated rash extending from the right anterior forearm up towards the shoulder joint and down towards the wrist.  There is a small laceration on the dorsal forearm, approximately 2 to 3 cm, very superficial.  Neurological:     General: No focal deficit present.     Mental Status: He is alert.  Psychiatric:        Mood and Affect: Mood normal.    ED Results / Procedures / Treatments   Labs (all labs ordered are listed, but only abnormal results are displayed) Labs Reviewed - No data to display  EKG None  Radiology No results found.  Procedures Procedures   Medications Ordered in ED Medications - No data to display  ED Course  I have reviewed the triage vital signs and the nursing notes.  Pertinent labs & imaging results that were available during my care of the patient were reviewed by me and considered in my medical decision making (see chart for details).    MDM Rules/Calculators/A&P                         Impression: Jeremy Deleon presents with rash of the right arm  Examination is consistent with diagnosis of cellulitis.  There is no area of currently drain-able abscess. The presentation of Jeremy Deleon is NOT consistent with necrotizing fascitis or osteomyolitis. There is no evidence of retained foreign body, or neurovascular or tendon injury. The presentation of Jeremy Deleon is NOT consistent with sepsis and/or bacteremia.   Plan:  Cellulitis - [name] meets outpatient criteria for treatment and is sent home on empiric  antibiotics covering the relevant bacteria, including MRSA if applicable.  Strict return and follow-up precautions have been given personally by me or by detailed written instruction verbalized by nursing staff using the teach back method to patient/family/caretaker.  Data  Reviewed/Counseling: I have reviewed the patient's vital signs, nursing notes, and other relevant tests/information. I had a detailed discussion regarding the historical points, exam findings, and any diagnostic results supporting the discharge diagnosis. I also discussed the need for outpatient follow-up and the need to return to the ED if symptoms worsen or if there are any questions or concerns that arise at home.   Final Clinical Impression(s) / ED Diagnoses Final diagnoses:  Cellulitis of right upper extremity    Rx / DC Orders ED Discharge Orders          Ordered    predniSONE (DELTASONE) 50 MG tablet  Daily        06/26/21 1606    sulfamethoxazole-trimethoprim (BACTRIM DS) 800-160 MG tablet  2 times daily        06/26/21 1606             Tonye Pearson, [place] 06/26/21 1706    Tegeler, Gwenyth Allegra, MD 06/27/21 [phone]

## 2021-06-26 NOTE — Discharge Instructions (Addendum)
You were diagnosed with cellulitis of the right arm, which is an infection of the skin on your arm.  I sent you home with a prescription for Bactrim, which is antibiotic that you will take twice a day for 7 days.  I have also sent you in a prescription for prednisone that you will take 1 pill once daily for 5 days.  I know that you do not want to take the steroid, but I will send in in case you feel you need to over the next day or 2.  Please follow-up with your primary care or your dermatologist within the next week so they can evaluate how well the treatment is working.  Please return to the ED if you develop fevers, drainage from your skin, or worsening symptoms.

## 2021-06-26 NOTE — ED Triage Notes (Signed)
C/o rash to right arm also c/o pain x 1 day

## 2021-06-27 ENCOUNTER — Telehealth: Payer: Self-pay | Admitting: Dermatology

## 2021-06-27 NOTE — Telephone Encounter (Signed)
Message for ST; he said he went to hospital for thing ST was uncertain about, and it was cellulitis, which moved from his chest to his arm. He's now on meds (prednisone + bactrim?) and is doing "much better". Wants ST to know what it was.

## 2021-06-28 ENCOUNTER — Encounter: Payer: Self-pay | Admitting: Dermatology

## 2021-07-18 ENCOUNTER — Other Ambulatory Visit: Payer: Self-pay

## 2021-07-18 ENCOUNTER — Ambulatory Visit (INDEPENDENT_AMBULATORY_CARE_PROVIDER_SITE_OTHER): Payer: Medicare Other | Admitting: Dermatology

## 2021-07-18 DIAGNOSIS — D0439 Carcinoma in situ of skin of other parts of face: Secondary | ICD-10-CM

## 2021-07-18 DIAGNOSIS — D099 Carcinoma in situ, unspecified: Secondary | ICD-10-CM

## 2021-07-18 DIAGNOSIS — C44319 Basal cell carcinoma of skin of other parts of face: Secondary | ICD-10-CM

## 2021-07-18 NOTE — Patient Instructions (Signed)

## 2021-07-19 ENCOUNTER — Other Ambulatory Visit: Payer: Self-pay | Admitting: Cardiology

## 2021-08-18 ENCOUNTER — Encounter: Payer: Self-pay | Admitting: Dermatology

## 2021-08-18 NOTE — Progress Notes (Signed)
Follow-Up Visit   Subjective  Jeremy Deleon is a 88 y.o. male who presents for the following: Procedure (Treatment scc x 3 ).  3 biopsy-proven carcinoma in situ is +1 growing spot right temple Location:  Duration:  Quality:  Associated Signs/Symptoms: Modifying Factors:  Severity:  Timing: Context:   Objective  Well appearing patient in no apparent distress; mood and affect are within normal limits. Right Temporal Scalp Lesion identified by Dr.Christina Waldrop and nurse in room.    Right Zygomatic Area Lesion identified by Dr.Deana Krock and nurse in room.    RIGHT SIDE BURN Lesion identified by Dr.Britaney Espaillat and nurse in room.    Right Temple In addition to the 3 proven carcinoma in situ sites he has a pearly 9 Millimeter lesion suggestive of BCC.       A focused examination was performed including head and neck. Relevant physical exam findings are noted in the Assessment and Plan.   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  Destruction of lesion Complexity: simple   Destruction method: electrodesiccation and curettage   Informed consent: discussed and consent obtained   Timeout:  patient name, date of birth, surgical site, and procedure verified Anesthesia: the lesion was anesthetized in a standard fashion   Anesthetic:  1% lidocaine w/ epinephrine 1-100,000 local infiltration Curettage performed in three different directions: Yes   Curettage cycles:  3 Lesion length (cm):  1.2 Lesion width (cm):  1.2 Margin per side (cm):  0 Final wound size (cm):  1.2 Hemostasis achieved with:  ferric subsulfate Outcome: patient tolerated procedure well with no complications   Post-procedure details: sterile dressing applied and wound care instructions given   Dressing type: bandage and petrolatum   Additional details:  Wound inoculated with 5% Fluorouracil.    Right Zygomatic Area  Destruction of lesion Complexity: simple    Destruction method: electrodesiccation and curettage   Informed consent: discussed and consent obtained   Timeout:  patient name, date of birth, surgical site, and procedure verified Anesthesia: the lesion was anesthetized in a standard fashion   Anesthetic:  1% lidocaine w/ epinephrine 1-100,000 local infiltration Curettage performed in three different directions: Yes   Curettage cycles:  3 Lesion length (cm):  1.2 Lesion width (cm):  1.2 Margin per side (cm):  0 Final wound size (cm):  1.2 Hemostasis achieved with:  ferric subsulfate Outcome: patient tolerated procedure well with no complications   Post-procedure details: sterile dressing applied and wound care instructions given   Dressing type: bandage and petrolatum   Additional details:  Wound inoculated with 5% Fluorouracil.    RIGHT SIDE BURN  Destruction of lesion Complexity: simple   Destruction method: electrodesiccation and curettage   Informed consent: discussed and consent obtained   Timeout:  patient name, date of birth, surgical site, and procedure verified Anesthesia: the lesion was anesthetized in a standard fashion   Anesthetic:  1% lidocaine w/ epinephrine 1-100,000 local infiltration Curettage performed in three different directions: Yes   Curettage cycles:  3 Lesion length (cm):  1.5 Lesion width (cm):  1.5 Margin per side (cm):  0 Final wound size (cm):  1.5 Hemostasis achieved with:  ferric subsulfate Outcome: patient tolerated procedure well with no complications   Post-procedure details: sterile dressing applied and wound care instructions given   Dressing type: bandage and petrolatum   Additional details:  Wound inoculated with 5% Fluorouracil.    Basal cell carcinoma (BCC) of right  cheek Right Temple  Skin / nail biopsy Type of biopsy: tangential   Informed consent: discussed and consent obtained   Timeout: patient name, date of birth, surgical site, and procedure verified   Anesthesia: the  lesion was anesthetized in a standard fashion   Anesthetic:  1% lidocaine w/ epinephrine 1-100,000 local infiltration Instrument used: flexible razor blade   Hemostasis achieved with: aluminum chloride and electrodesiccation   Outcome: patient tolerated procedure well   Post-procedure details: wound care instructions given    Destruction of lesion Complexity: simple   Destruction method: electrodesiccation and curettage   Informed consent: discussed and consent obtained   Timeout:  patient name, date of birth, surgical site, and procedure verified Anesthesia: the lesion was anesthetized in a standard fashion   Anesthetic:  1% lidocaine w/ epinephrine 1-100,000 local infiltration Curettage cycles:  3 Lesion length (cm):  1 Lesion width (cm):  1 Margin per side (cm):  0 Final wound size (cm):  1 Hemostasis achieved with:  ferric subsulfate Outcome: patient tolerated procedure well with no complications   Post-procedure details: sterile dressing applied and wound care instructions given   Dressing type: bandage and petrolatum    Specimen 1 - Surgical pathology Differential Diagnosis: SCC VS BCC  Check Margins: No  After shave biopsy the base was treated by curettage plus cautery      I, Lavonna Monarch, MD, have reviewed all documentation for this visit.  The documentation on 08/18/21 for the exam, diagnosis, procedures, and orders are all accurate and complete.

## 2021-10-24 ENCOUNTER — Encounter: Payer: Medicare Other | Admitting: Dermatology

## 2021-10-29 ENCOUNTER — Ambulatory Visit (INDEPENDENT_AMBULATORY_CARE_PROVIDER_SITE_OTHER): Payer: Medicare Other | Admitting: Dermatology

## 2021-10-29 DIAGNOSIS — Z86007 Personal history of in-situ neoplasm of skin: Secondary | ICD-10-CM

## 2021-10-29 DIAGNOSIS — L570 Actinic keratosis: Secondary | ICD-10-CM

## 2021-11-04 ENCOUNTER — Ambulatory Visit: Payer: Medicare Other | Admitting: Cardiology

## 2021-11-05 DIAGNOSIS — H0102A Squamous blepharitis right eye, upper and lower eyelids: Secondary | ICD-10-CM | POA: Diagnosis not present

## 2021-11-05 DIAGNOSIS — H0102B Squamous blepharitis left eye, upper and lower eyelids: Secondary | ICD-10-CM | POA: Diagnosis not present

## 2021-11-06 ENCOUNTER — Encounter: Payer: Self-pay | Admitting: Cardiology

## 2021-11-06 ENCOUNTER — Ambulatory Visit: Payer: Medicare Other | Admitting: Cardiology

## 2021-11-06 VITALS — BP 126/74 | HR 66 | Ht 74.0 in | Wt 215.4 lb

## 2021-11-06 DIAGNOSIS — I10 Essential (primary) hypertension: Secondary | ICD-10-CM

## 2021-11-06 DIAGNOSIS — I951 Orthostatic hypotension: Secondary | ICD-10-CM | POA: Diagnosis not present

## 2021-11-06 DIAGNOSIS — Z9861 Coronary angioplasty status: Secondary | ICD-10-CM | POA: Diagnosis not present

## 2021-11-06 DIAGNOSIS — M7989 Other specified soft tissue disorders: Secondary | ICD-10-CM

## 2021-11-06 DIAGNOSIS — E785 Hyperlipidemia, unspecified: Secondary | ICD-10-CM | POA: Diagnosis not present

## 2021-11-06 DIAGNOSIS — I2510 Atherosclerotic heart disease of native coronary artery without angina pectoris: Secondary | ICD-10-CM | POA: Diagnosis not present

## 2021-11-06 DIAGNOSIS — R55 Syncope and collapse: Secondary | ICD-10-CM

## 2021-11-06 NOTE — Progress Notes (Signed)
Primary Care Provider: [name], MD Cardiologist: Glenetta Hew, MD Electrophysiologist: None  Clinic Note: Chief Complaint  Patient presents with   Follow-up    Doing well.  No major changes.  No further syncope or near syncope.   Coronary Artery Disease    17 years out from PCI with no recurrent symptoms of angina or prominent dyspnea on exertion.    ===================================  ASSESSMENT/PLAN   Problem List Items Addressed This Visit       Cardiology Problems   CAD S/P percutaneous coronary angioplasty -- Taxus DES in Prox LAD - Primary (Chronic)    17 years out from [place] to the proximal LAD for abnormal Myoview.  Most recent Myoview was in 2009-nonischemic.  No recurrent anginal symptoms. On minimal therapy: Aspirin 81 mg daily, rosuvastatin 40 Miller daily and Imdu along with fenofibrate 145 mg       Relevant Orders   EKG 12-Lead (Completed)   Dyslipidemia, goal LDL below 70 (Chronic)    LDL actually looks great as of December.  Improved from the year before.    On stable dose of 40 mg rosuvastatin plus fenofibrate 145 mg.  Tolerating well.  No change       Essential hypertension (Chronic)    Was given the diagnosis of hypertension, but is now on no medications with well-controlled blood pressures.  No longer on any ACE inhibitor or ARB.  Feeling better.  No further syncopal episodes.       Relevant Orders   EKG 12-Lead (Completed)   Orthostatic hypotension; with baseline hypotension (Chronic)    We will do another scan to avoid hypotension.  Allowing permissive hypertension.  Not on any medications that would affect blood pressure other than Imdur.  Continue to encourage adequate hydration and nutrition.         Other   Syncope and collapse    No further episodes.  Allowing permissive hypertension.  Continue to stress importance of adequate hydration and nutrition.  Diuretic discontinued. Continue to stay active and  mobile to maintain strength.       Swelling of lower leg (Chronic)    Would prefer to avoid a diuretic.  Best course of action is to continue foot elevation and support stockings as tolerated. With no PND orthopnea, it is probably not related to any CHF.       ===================================  HPI:    Jeremy Deleon is a 88 y.o. male with a PMH notable for CAD-PCI of LAD in 2006 (for Abnormal Myoview), SSSCa-LLL (s/p VATS-LLL Lobectomy, & Mediastinal LN Dissection-January 2016), along with HLD who presents today for annual follow-up at the request of [name], Shyrl Numbers, *.  [name] was last seen on September 19, 2020 for routine follow-up doing pretty well.  No chest pain or pressure with rest or exertion.  5 years out from lung cancer-no issues.  Exertional dyspnea with increased activity, but not really doing that much.  Was doing some yard work and gardening.  Able to do ADLs without issues.  Still drives and does house chores.  Frequent nocturia.  Mild puffy swelling in the feet. Allowing permissive hypertension because orthostatic dizziness.  Adequate hydration.  Beta-blocker and ACE inhibitor have been stopped.  Only on Imdur Trying to minimize medications.  Taking aspirin 81 mg daily-okay to hold for bruising.  Recent Hospitalizations:  ED visit 06/26/2021 for cellulitis of right forearm.-Likely complication of herpes zoster.  Reviewed  CV studies:  The following studies were reviewed today: (if available, images/films reviewed: From Epic Chart or Care Everywhere) None:  Interval History:   ABDULRAHMAN BRACEY returns for annual follow-up doing pretty well.  He is pretty stable.  He still gets short of breath if he does increase level activity but this is stable.  He is not all that active.  He gets around the house and does his ADLs.  Does all the house chores.  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  This way he can  have the boxes hanging and not have to bend over to work on the "garden ".  Although he has exertional dyspnea, he has no chest pain or pressure with rest or exertion.  Stable mild swelling but usually goes down when he puts his legs up at night.  No PND orthopnea.  Rare skipped beats but no prolonged irregular heartbeats palpitations.  No syncope or near syncope.  No claudication   REVIEWED OF SYSTEMS   Pertinent review of symptoms as follows: Not very active, can get tired easily.  Less stamina. Leg swelling Frequent nocturia-4-5 times most nights up to 6 or 8 times a month. Very limiting back and hip pain. Bruising-senile purpura Not sleeping well because of nocturia..  I have reviewed and (if needed) personally updated the patient's problem list, medications, allergies, past medical and surgical history, social and family history.   PAST MEDICAL HISTORY   Past Medical History:  Diagnosis Date   Basal cell carcinoma    BPH (benign prostatic hyperplasia)    CAD S/P percutaneous coronary angioplasty 07/25/2004   Cardiologist-- Dr Ellyn Hack - Jan '06Abnormal Myoview with inferolateral ischemia: --> per cardiac cath 09-16-2004 -- 80% prox LAD lesion PCI: Taxus DES 3.0 mm x 16 mm-;; Myoview June 2009 no ischemia or infarction with attenuation artifact   Chronic constipation    Dyslipidemia, goal LDL below 70    on simvastatin and tricor   Emphysema of lung ([place])    Full dentures    GERD (gastroesophageal reflux disease)    Graves' disease 11/ 2016 dx   endocrinologist-  dr Renato Shin-- treated with tapazole until 2018 when labs were normal   History of basal cell carcinoma (BCC) excision    Hypertension    Left hydrocele    Mild atherosclerosis of both carotid arteries    duplex 09-09-2016  1-39% bilateral ICA   Osteoarthritis    S/P drug eluting coronary stent placement 09/16/2004   x1  to prox. LAD   Squamous cell lung cancer : Left lower lobe 07/2014   - oncologist-  dr  Julien Nordmann---  per lov note in epic no recurrence: s/p  Bronchoscopy showed extrinsic compression of the left lower lobe with endobronchial biopsies were negative. He subsequently had a needle biopsy which dx the mass as squamous cell carcinoma /  08-10-2014  s/p  Left VATS w/ LLlobectomy with node dissections and completed chemotherapy (11/2014)   Wears glasses    Wears hearing aid in both ears     PAST SURGICAL HISTORY   Past Surgical History:  Procedure Laterality Date   APPENDECTOMY  child   CATARACT EXTRACTION W/ INTRAOCULAR LENS  IMPLANT, BILATERAL  2016 & 2017   HYDROCELE EXCISION Left 03/12/2018   Procedure: HYDROCELECTOMY ADULT;  Surgeon: Ardis Hughs, MD;  Location: [facility];  Service: Urology;  Laterality: Left;   NM MYOVIEW LTD  January 06, 2008    treadmill  myoview - tissue attenuation but no ischemia or infarction   PERCUTANEOUS CORONARY STENT INTERVENTION (PCI-S)  2/27/ 2006    dr Tami Ribas  $Remo'@[facility]'HJq[facility]$    (07-25-2004 positive cardiolite for ischemia)  balloon angioplasty and TAXUS DES 3.0 mm x 16 mm stent ->  PROX  LAD (80%)   TRANSTHORACIC ECHOCARDIOGRAM  09/16/2016   Limited echo with bubble study: EF 60- 65%, GR 1 DD./ Negative bubble study/  mild AR/  mild dilated ascending aorta/ No regional wall motion abnormality   UMBILICAL HERNIA REPAIR  11-23-2007    dr Rise Patience   '@[facility]'$    VIDEO ASSISTED THORACOSCOPY (VATS)/ LOBECTOMY Left 08/10/2014   Procedure: VIDEO ASSISTED THORACOSCOPY (VATS)/ LOBECTOMY;  Surgeon: Ivin Poot, MD;  Location: [place];  Service: Thoracic;  Laterality: Left;   VIDEO BRONCHOSCOPY N/A 08/10/2014   Procedure: VIDEO BRONCHOSCOPY;  Surgeon: Ivin Poot, MD;  Location: [place];  Service: Thoracic;  Laterality: N/A;    Immunization History  Administered Date(s) Administered   Influenza Split 06/05/2009, 05/02/2010, 06/09/2011, 03/29/2012, 06/23/2014   Influenza, High Dose Seasonal PF 06/28/2018, 04/28/2019   Pneumococcal Conjugate-13  04/25/2014   Pneumococcal Polysaccharide-23 09/11/2009   Tdap 11/01/2008, 06/12/2017   Zoster, Live 03/29/2012    MEDICATIONS/ALLERGIES   Current Meds  Medication Sig   aspirin EC 81 MG tablet Take 81 mg by mouth daily.   clobetasol cream (TEMOVATE) 6.00 % Apply 1 application topically 2 (two) times daily.   fenofibrate (TRICOR) 145 MG tablet TAKE 1 TABLET(145 MG) BY MOUTH DAILY   finasteride (PROSCAR) 5 MG tablet    hydrocortisone cream 1 % Apply to affected area 2 times daily   levothyroxine (SYNTHROID, LEVOTHROID) 50 MCG tablet Take 1 tablet by mouth daily.   rosuvastatin (CRESTOR) 40 MG tablet TAKE 1 TABLET(40 MG) BY MOUTH DAILY   traMADol (ULTRAM) 50 MG tablet Take 50 mg by mouth 2 (two) times daily as needed.   valACYclovir (VALTREX) 1000 MG tablet Take 1 tablet (1,000 mg total) by mouth 2 (two) times daily. 1 po tid   [DISCONTINUED] fenofibrate (TRICOR) 145 MG tablet Take by mouth.   [DISCONTINUED] isosorbide mononitrate (IMDUR) 30 MG 24 hr tablet TAKE 1 TABLET(30 MG) BY MOUTH DAILY    Allergies  Allergen Reactions   Doxycycline Rash   Niaspan [Niacin Er] Rash and Other (See Comments)    flushing    SOCIAL HISTORY/FAMILY HISTORY   Reviewed in Epic:  Pertinent findings:  Social History   Tobacco Use   Smoking status: Former    Years: 20.00    Types: Cigarettes    Quit date: 10/25/1982    Years since quitting: 39.1   Smokeless tobacco: Never  Vaping Use   Vaping Use: Never used  Substance Use Topics   Alcohol use: No   Drug use: No   Social History   Social History Narrative    He is a widowed father of 75, grandfather of 20, still working. Does not smoke, quit 30   years ago and does not drink alcohol. He still works in his Nurse, learning disability for [employer].   He does still live alone, but has lots of help.  His son comes by to see him every night.  He goes out to eat lunch every day.   Able to carry out his ADLs including driving.   Does not  get much exercise because of unsteady gait and orthostatic dizziness.   He does have a  living well, and has a healthcare power of attorney.    OBJCTIVE -PE, EKG, labs   Wt Readings from Last 3 Encounters:  11/19/21 215 lb 4 oz (97.6 kg)  11/06/21 215 lb 6.4 oz (97.7 kg)  06/26/21 216 lb (98 kg)    Physical Exam: BP 126/74   Pulse 66   Ht $R'6\' 2"'rL$  (1.88 m)   Wt 215 lb 6.4 oz (97.7 kg)   SpO2 98%   BMI 27.66 kg/m  Physical Exam Vitals reviewed.  Constitutional:      General: He is not in acute distress.    Appearance: Normal appearance. He is normal weight. He is not ill-appearing or toxic-appearing.     Comments: Well-appearing for age.  Well-nourished, well-groomed.  HENT:     Head: Normocephalic and atraumatic.     Ears:     Comments: Very hard of hearing Neck:     Vascular: No carotid bruit or JVD.  Cardiovascular:     Rate and Rhythm: Normal rate and regular rhythm. No extrasystoles are present.    Chest Wall: PMI is not displaced.     Pulses: Normal pulses.     Heart sounds: S1 normal and S2 normal. No murmur heard.   No friction rub. No gallop.  Pulmonary:     Effort: Pulmonary effort is normal. No respiratory distress.     Breath sounds: Normal breath sounds. No wheezing, rhonchi or rales.  Chest:     Chest wall: No tenderness.  Abdominal:     General: Abdomen is flat. Bowel sounds are normal. There is no distension.     Palpations: Abdomen is soft. There is no mass.     Tenderness: There is no abdominal tenderness.     Comments: No HSM or bruit  Musculoskeletal:        General: No swelling. Normal range of motion.     Cervical back: Normal range of motion and neck supple.  Skin:    General: Skin is warm and dry.  Neurological:     General: No focal deficit present.     Mental Status: He is alert and oriented to person, place, and time.     Gait: Gait abnormal (Slowed, deliberate gait).  Psychiatric:        Mood and Affect: Mood normal.        Behavior:  Behavior normal.        Thought Content: Thought content normal.        Judgment: Judgment normal.     Adult ECG Report  Rate: 66 ;  Rhythm: normal sinus rhythm and 1 AVB. ; Otherwise normal axis, intervals and durations.  Narrative Interpretation: Stable  Recent Labs: Due for lab recheck by PCP in June. [facility] Related to Lipid Panel Component 06/24/21 07/02/20  Cholesterol, Total 131 144  Triglycerides 137 165 High   HDL 37 Low  43  VLDL Cholesterol Cal 24 28  LDL 70 73   Comprehensive Metabolic Panel Component 86/76/72  01/03/21   Glucose 99 123 High   BUN 21 21  Creatinine 1.07 1.09  eGFR 68 67  Sodium 140 138  Potassium 4.2 4.0  Chloride 103 100  CO2 25 23  CALCIUM 9.3 8.9  Total Protein 6.4 6.0  Albumin, Serum 4.0 3.8  Globulin, Total 2.4 2.2  Total Bilirubin 0.3 0.6  Alkaline Phosphatase 86 63  AST 27 20  ALT (SGPT) 12 8     CBC And Differential Component 06/24/21  01/03/21  07/02/20   WBC 6.6 6.9 6.1  RBC 4.59 4.49 4.63  Hemoglobin 15.1 14.2 14.6  Hematocrit 42.8 41.9 43.1  MCV 93 93 93  MCH 32.9 31.6 31.5  MCHC 35.3 33.9 33.9  RDW 14.7 13.4 14.0  Platelet Count 204 171 151   TSH Rfx on Abnormal to Free T4 Component 06/24/21  07/02/20  06/24/19   TSH 0.891 1.130 0.873   ==================================================  COVID-19 Education: The signs and symptoms of COVID-19 were discussed with the patient and how to seek care for testing (follow up with PCP or arrange E-visit).    I spent a total of 18 minutes with the patient spent in direct patient consultation.  Additional time spent with chart review  / charting (studies, outside notes, etc): 14 min Total Time: 32 min  Current medicines are reviewed at length with the patient today.  (+/- concerns) none  Notice: This dictation was prepared with Dragon dictation along with smart phrase technology. Any transcriptional errors that result from this process are unintentional and may not  be corrected upon review.  Studies Ordered:   Orders Placed This Encounter  Procedures   EKG 12-Lead  No orders of the defined types were placed in this encounter.   Patient Instructions / Medication Changes & Studies & Tests Ordered   Patient Instructions  Medication Instructions:   No changes  *If you need a refill on your cardiac medications before your next appointment, please call your pharmacy*   Lab Work:  Not needed   Testing/Procedures: Not needed   Follow-Up: At [facility], you and your health needs are our priority.  As part of our continuing mission to provide you with exceptional heart care, we have created designated Provider Care Teams.  These Care Teams include your primary Cardiologist (physician) and Advanced Practice Providers (APPs -  Physician Assistants and Nurse Practitioners) who all work together to provide you with the care you need, when you need it.   Other instructions  Drink more water a day   Your next appointment:   12 month(s)  The format for your next appointment:   In Person  Provider:   Glenetta Hew, MD         Glenetta Hew, M.D., M.S. Interventional Cardiologist   Pager # [phone] Phone # [phone] [address] [place], [place] [address]   Thank you for choosing Heartcare at [facility]!!

## 2021-11-06 NOTE — Patient Instructions (Addendum)
Medication Instructions:  ? ?No changes  ?*If you need a refill on your cardiac medications before your next appointment, please call your pharmacy* ? ? ?Lab Work: ? ?Not needed ? ? ?Testing/Procedures: ?Not needed ? ? ?Follow-Up: ?At [facility], you and your health needs are our priority.  As part of our continuing mission to provide you with exceptional heart care, we have created designated Provider Care Teams.  These Care Teams include your primary Cardiologist (physician) and Advanced Practice Providers (APPs -  Physician Assistants and Nurse Practitioners) who all work together to provide you with the care you need, when you need it. ? ? Other instructions  ?Drink more water a day  ? ?Your next appointment:   ?12 month(s) ? ?The format for your next appointment:   ?In Person ? ?Provider:   ?Glenetta Hew, MD  ? ? ? ?

## 2021-11-14 DIAGNOSIS — R350 Frequency of micturition: Secondary | ICD-10-CM | POA: Diagnosis not present

## 2021-11-14 DIAGNOSIS — R351 Nocturia: Secondary | ICD-10-CM | POA: Diagnosis not present

## 2021-11-14 DIAGNOSIS — N499 Inflammatory disorder of unspecified male genital organ: Secondary | ICD-10-CM | POA: Diagnosis not present

## 2021-11-17 ENCOUNTER — Encounter: Payer: Self-pay | Admitting: Dermatology

## 2021-11-17 NOTE — Progress Notes (Signed)
? ?  Follow-Up Visit ?  ?Subjective  ?Jeremy Deleon is a 88 y.o. male who presents for the following: Follow-up (Here for follow up on lesions treated back in December. ). ? ?Recheck surgical site, several other crusts ?Location:  ?Duration:  ?Quality:  ?Associated Signs/Symptoms: ?Modifying Factors:  ?Severity:  ?Timing: ?Context:  ? ?Objective  ?Well appearing patient in no apparent distress; mood and affect are within normal limits. ?Right Temporal Scalp ?Biopsy actually showed a BCC, no residual skin cancer, minimal scar. ? ?Right Mid Helix (3) ?2 mm hornlike pink crusts (3) ? ? ? ?A focused examination was performed including head and neck. Relevant physical exam findings are noted in the Assessment and Plan. ? ? ?Assessment & Plan  ? ? ?History of squamous cell carcinoma in situ ?Right Temporal Scalp ? ?General as needed change. ? ?AK (actinic keratosis) (3) ?Right Mid Helix ? ?Destruction of lesion - Right Mid Helix ?Complexity: simple   ?Destruction method: cryotherapy   ?Informed consent: discussed and consent obtained   ?Timeout:  patient name, date of birth, surgical site, and procedure verified ?Lesion destroyed using liquid nitrogen: Yes   ?Cryotherapy cycles:  5 ?Outcome: patient tolerated procedure well with no complications   ?Post-procedure details: wound care instructions given   ? ? ? ? ? ?I, Lavonna Monarch, MD, have reviewed all documentation for this visit.  The documentation on 11/17/21 for the exam, diagnosis, procedures, and orders are all accurate and complete. ?

## 2021-11-18 ENCOUNTER — Ambulatory Visit (HOSPITAL_COMMUNITY)
Admission: RE | Admit: 2021-11-18 | Discharge: 2021-11-18 | Disposition: A | Discharge status: Home / Self Care | Payer: Medicare Other | Source: Ambulatory Visit | Attending: Internal Medicine | Admitting: Internal Medicine

## 2021-11-18 ENCOUNTER — Inpatient Hospital Stay: Payer: Medicare Other | Attending: Internal Medicine

## 2021-11-18 ENCOUNTER — Encounter (HOSPITAL_COMMUNITY): Payer: Self-pay

## 2021-11-18 ENCOUNTER — Other Ambulatory Visit: Payer: Self-pay

## 2021-11-18 DIAGNOSIS — Z79899 Other long term (current) drug therapy: Secondary | ICD-10-CM | POA: Insufficient documentation

## 2021-11-18 DIAGNOSIS — Z85118 Personal history of other malignant neoplasm of bronchus and lung: Secondary | ICD-10-CM | POA: Insufficient documentation

## 2021-11-18 DIAGNOSIS — C3490 Malignant neoplasm of unspecified part of unspecified bronchus or lung: Secondary | ICD-10-CM | POA: Diagnosis not present

## 2021-11-18 DIAGNOSIS — J8410 Pulmonary fibrosis, unspecified: Secondary | ICD-10-CM | POA: Diagnosis not present

## 2021-11-18 DIAGNOSIS — Z9221 Personal history of antineoplastic chemotherapy: Secondary | ICD-10-CM | POA: Insufficient documentation

## 2021-11-18 DIAGNOSIS — I700 Atherosclerosis of aorta: Secondary | ICD-10-CM | POA: Diagnosis not present

## 2021-11-18 LAB — CMP (CANCER CENTER ONLY)
ALT: 14 U/L (ref 0–44)
AST: 28 U/L (ref 15–41)
Albumin: 4 g/dL (ref 3.5–5.0)
Alkaline Phosphatase: 53 U/L (ref 38–126)
Anion gap: 4 — ABNORMAL LOW (ref 5–15)
BUN: 15 mg/dL (ref 8–23)
CO2: 29 mmol/L (ref 22–32)
Calcium: 9.3 mg/dL (ref 8.9–10.3)
Chloride: 106 mmol/L (ref 98–111)
Creatinine: 1.14 mg/dL (ref 0.61–1.24)
GFR, Estimated: >60 mL/min (ref >60)
Glucose, Bld: 97 mg/dL (ref 70–99)
Potassium: 4 mmol/L (ref 3.5–5.1)
Sodium: 139 mmol/L (ref 135–145)
Total Bilirubin: 0.7 mg/dL (ref 0.3–1.2)
Total Protein: 6.7 g/dL (ref 6.5–8.1)

## 2021-11-18 LAB — CBC WITH DIFFERENTIAL (CANCER CENTER ONLY)
Abs Immature Granulocytes: 0.02 10*3/uL (ref 0.00–0.07)
Basophils Absolute: 0.1 10*3/uL (ref 0.0–0.1)
Basophils Relative: 1 %
Eosinophils Absolute: 0.3 10*3/uL (ref 0.0–0.5)
Eosinophils Relative: 4 %
HCT: 45.9 % (ref 39.0–52.0)
Hemoglobin: 15.4 g/dL (ref 13.0–17.0)
Immature Granulocytes: 0 %
Lymphocytes Relative: 38 %
Lymphs Abs: 2.4 10*3/uL (ref 0.7–4.0)
MCH: 31.2 pg (ref 26.0–34.0)
MCHC: 33.6 g/dL (ref 30.0–36.0)
MCV: 93.1 fL (ref 80.0–100.0)
Monocytes Absolute: 0.5 10*3/uL (ref 0.1–1.0)
Monocytes Relative: 8 %
Neutro Abs: 3.2 10*3/uL (ref 1.7–7.7)
Neutrophils Relative %: 49 %
Platelet Count: 186 10*3/uL (ref 150–400)
RBC: 4.93 MIL/uL (ref 4.22–5.81)
RDW: 14.6 % (ref 11.5–15.5)
WBC Count: 6.4 10*3/uL (ref 4.0–10.5)
nRBC: 0 % (ref 0.0–0.2)

## 2021-11-18 MED ORDER — IOHEXOL 300 MG/ML  SOLN
75.0000 mL | Freq: Once | INTRAMUSCULAR | Status: AC | PRN
  Administered 2021-11-18: 75 mL via INTRAVENOUS

## 2021-11-18 MED ORDER — SODIUM CHLORIDE (PF) 0.9 % IJ SOLN
INTRAMUSCULAR | Status: AC
  Filled 2021-11-18: qty 50

## 2021-11-19 ENCOUNTER — Inpatient Hospital Stay (HOSPITAL_BASED_OUTPATIENT_CLINIC_OR_DEPARTMENT_OTHER): Payer: Medicare Other | Admitting: Internal Medicine

## 2021-11-19 VITALS — BP 120/60 | HR 77 | Temp 97.8°F | Resp 18 | Wt 215.2 lb

## 2021-11-19 DIAGNOSIS — C3490 Malignant neoplasm of unspecified part of unspecified bronchus or lung: Secondary | ICD-10-CM | POA: Diagnosis not present

## 2021-11-19 DIAGNOSIS — Z9221 Personal history of antineoplastic chemotherapy: Secondary | ICD-10-CM | POA: Diagnosis not present

## 2021-11-19 DIAGNOSIS — Z85118 Personal history of other malignant neoplasm of bronchus and lung: Secondary | ICD-10-CM | POA: Diagnosis not present

## 2021-11-19 DIAGNOSIS — Z79899 Other long term (current) drug therapy: Secondary | ICD-10-CM | POA: Diagnosis not present

## 2021-11-19 NOTE — Progress Notes (Signed)
?    [place] ?Telephone:(336) [phone]   [phone] ? ?OFFICE PROGRESS NOTE ? ?[name], Shyrl Numbers, MD ?[address] ?[place] [address] ? ?DIAGNOSIS: Stage IIA (T2b, N0, M0) non-small cell lung cancer consistent with squamous cell carcinoma diagnosed in January 2016 ? ?PRIOR THERAPY:  ?1) Status post left VATS with left lower lobectomy and mediastinal lymph node dissection under the care of Dr. Prescott Gum on 08/10/2014. ?2) Adjuvant systemic chemotherapy with carboplatin for AUC of 5 on day 1 and gemcitabine 1000 MG/M2 on days 1 and 8 every 3 weeks. First dose 09/19/2014. ? ?CURRENT THERAPY: Observation. ? ?INTERVAL HISTORY: ?Jeremy Deleon 88 y.o. male returns to the clinic today for annual follow-up visit.  The patient is feeling fine today with no concerning complaints.  He denied having any current chest pain, shortness of breath, cough or hemoptysis.  He denied having any fever or chills.  He has no nausea, vomiting, diarrhea or constipation.  He has no headache or visual changes.  He has no weight loss or night sweats.  He is here today for evaluation with repeat CT scan of the chest for restaging of his disease. ? ? ?MEDICAL HISTORY: ?Past Medical History:  ?Diagnosis Date  ? Basal cell carcinoma   ? BPH (benign prostatic hyperplasia)   ? CAD S/P percutaneous coronary angioplasty 07/25/2004  ? Cardiologist-- Dr Ellyn Hack - Jan '06Abnormal Myoview with inferolateral ischemia: --> per cardiac cath 09-16-2004 -- 80% prox LAD lesion PCI: Taxus DES 3.0 mm x 16 mm-;; Myoview June 2009 no ischemia or infarction with attenuation artifact  ? Chronic constipation   ? Dyslipidemia, goal LDL below 70   ? on simvastatin and tricor  ? Emphysema of lung ([place])   ? Full dentures   ? GERD (gastroesophageal reflux disease)   ? Graves' disease 11/ 2016 dx  ? endocrinologist-  dr Renato Shin-- treated with tapazole until 2018 when labs were normal  ? History of basal cell carcinoma  (BCC) excision   ? Hypertension   ? Left hydrocele   ? Mild atherosclerosis of both carotid arteries   ? duplex 09-09-2016  1-39% bilateral ICA  ? Osteoarthritis   ? S/P drug eluting coronary stent placement 09/16/2004  ? x1  to prox. LAD  ? Squamous cell lung cancer : Left lower lobe 07/2014  ? - oncologist-  dr Julien Nordmann---  per lov note in epic no recurrence: s/p  Bronchoscopy showed extrinsic compression of the left lower lobe with endobronchial biopsies were negative. He subsequently had a needle biopsy which dx the mass as squamous cell carcinoma /  08-10-2014  s/p  Left VATS w/ LLlobectomy with node dissections and completed chemotherapy (11/2014)  ? Wears glasses   ? Wears hearing aid in both ears   ? ? ?ALLERGIES:  is allergic to niaspan [niacin er]. ? ?MEDICATIONS:  ?Current Outpatient Medications  ?Medication Sig Dispense Refill  ? aspirin EC 81 MG tablet Take 81 mg by mouth daily.    ? clobetasol cream (TEMOVATE) 1.49 % Apply 1 application topically 2 (two) times daily. 30 g 0  ? fenofibrate (TRICOR) 145 MG tablet Take by mouth.    ? fenofibrate (TRICOR) 145 MG tablet TAKE 1 TABLET(145 MG) BY MOUTH DAILY 90 tablet 3  ? finasteride (PROSCAR) 5 MG tablet     ? hydrocortisone cream 1 % Apply to affected area 2 times daily 15 g 0  ? isosorbide mononitrate (IMDUR) 30 MG 24  hr tablet TAKE 1 TABLET(30 MG) BY MOUTH DAILY 90 tablet 3  ? levothyroxine (SYNTHROID, LEVOTHROID) 50 MCG tablet Take 1 tablet by mouth daily.    ? rosuvastatin (CRESTOR) 40 MG tablet TAKE 1 TABLET(40 MG) BY MOUTH DAILY 90 tablet 3  ? traMADol (ULTRAM) 50 MG tablet Take 50 mg by mouth 2 (two) times daily as needed.    ? valACYclovir (VALTREX) 1000 MG tablet Take 1 tablet (1,000 mg total) by mouth 2 (two) times daily. 1 po tid 21 tablet 0  ? ?No current facility-administered medications for this visit.  ? ? ?SURGICAL HISTORY:  ?Past Surgical History:  ?Procedure Laterality Date  ? APPENDECTOMY  child  ? CATARACT EXTRACTION W/ INTRAOCULAR LENS   IMPLANT, BILATERAL  2016 & 2017  ? HYDROCELE EXCISION Left 03/12/2018  ? Procedure: HYDROCELECTOMY ADULT;  Surgeon: Ardis Hughs, MD;  Location: [facility];  Service: Urology;  Laterality: Left;  ? NM MYOVIEW LTD  January 06, 2008   ? treadmill  myoview - tissue attenuation but no ischemia or infarction  ? PERCUTANEOUS CORONARY STENT INTERVENTION (PCI-S)  2/27/ 2006    dr Tami Ribas  @[facility]   ? (07-25-2004 positive cardiolite for ischemia)  balloon angioplasty and TAXUS DES 3.0 mm x 16 mm stent ->  PROX  LAD (80%)  ? TRANSTHORACIC ECHOCARDIOGRAM  09/16/2016  ? Limited echo with bubble study: EF 60- 65%, GR 1 DD./ Negative bubble study/  mild AR/  mild dilated ascending aorta/ No regional wall motion abnormality  ? UMBILICAL HERNIA REPAIR  11-23-2007    dr Rise Patience   @[facility]   ? VIDEO ASSISTED THORACOSCOPY (VATS)/ LOBECTOMY Left 08/10/2014  ? Procedure: VIDEO ASSISTED THORACOSCOPY (VATS)/ LOBECTOMY;  Surgeon: Ivin Poot, MD;  Location: [place];  Service: Thoracic;  Laterality: Left;  ? VIDEO BRONCHOSCOPY N/A 08/10/2014  ? Procedure: VIDEO BRONCHOSCOPY;  Surgeon: Ivin Poot, MD;  Location: [facility] OR;  Service: Thoracic;  Laterality: N/A;  ? ? ?REVIEW OF SYSTEMS:  A comprehensive review of systems was negative.  ? ?PHYSICAL EXAMINATION: General appearance: alert, cooperative and no distress ?Head: Normocephalic, without obvious abnormality, atraumatic ?Neck: no adenopathy, no JVD, supple, symmetrical, trachea midline and thyroid not enlarged, symmetric, no tenderness/mass/nodules ?Lymph nodes: Cervical, supraclavicular, and axillary nodes normal. ?Resp: clear to auscultation bilaterally ?Back: symmetric, no curvature. ROM normal. No CVA tenderness. ?Cardio: normal apical impulse ?GI: soft, non-tender; bowel sounds normal; no masses,  no organomegaly ?Extremities: extremities normal, atraumatic, no cyanosis or edema ? ?ECOG PERFORMANCE STATUS: 1 - Symptomatic but completely ambulatory ? ?Blood pressure (!)  147/70, pulse 77, temperature 97.8 ?F (36.6 ?C), resp. rate 18, weight 215 lb 4 oz (97.6 kg), SpO2 96 %. ? ?LABORATORY DATA: ?Lab Results  ?Component Value Date  ? WBC 6.4 11/18/2021  ? HGB 15.4 11/18/2021  ? HCT 45.9 11/18/2021  ? MCV 93.1 11/18/2021  ? PLT 186 11/18/2021  ? ? ?  Chemistry   ?   ?Component Value Date/Time  ? NA 139 11/18/2021 0907  ? NA 140 11/22/2019 0842  ? NA 141 03/24/2017 1018  ? K 4.0 11/18/2021 0907  ? K 4.2 03/24/2017 1018  ? CL 106 11/18/2021 0907  ? CO2 29 11/18/2021 0907  ? CO2 25 03/24/2017 1018  ? BUN 15 11/18/2021 0907  ? BUN 21 11/22/2019 0842  ? BUN 16.5 03/24/2017 1018  ? CREATININE 1.14 11/18/2021 0907  ? CREATININE 1.2 03/24/2017 1018  ?    ?Component Value Date/Time  ?  CALCIUM 9.3 11/18/2021 0907  ? CALCIUM 9.6 03/24/2017 1018  ? ALKPHOS 53 11/18/2021 0907  ? ALKPHOS 87 03/24/2017 1018  ? AST 28 11/18/2021 0907  ? AST 30 03/24/2017 1018  ? ALT 14 11/18/2021 0907  ? ALT 17 03/24/2017 1018  ? BILITOT 0.7 11/18/2021 0907  ? BILITOT 0.56 03/24/2017 1018  ?  ? ? ? ?RADIOGRAPHIC STUDIES: ?CT Chest W Contrast ? ?Result Date: 11/18/2021 ?CLINICAL DATA:  Non-small cell lung carcinoma. Previous left lower lobectomy and chemotherapy. EXAM: CT CHEST WITH CONTRAST TECHNIQUE: Multidetector CT imaging of the chest was performed during intravenous contrast administration. RADIATION DOSE REDUCTION: This exam was performed according to the departmental dose-optimization program which includes automated exposure control, adjustment of the mA and/or kV according to patient size and/or use of iterative reconstruction technique. CONTRAST:  46mL OMNIPAQUE IOHEXOL 300 MG/ML  SOLN COMPARISON:  11/19/2020 FINDINGS: Cardiovascular: No acute findings. Aortic and coronary atherosclerotic calcification noted. Mediastinum/Nodes: No masses or pathologically enlarged lymph nodes identified. Lungs/Pleura: Stable postop changes from prior left lower lobectomy. Calcified granuloma again seen in the lateral right  upper lobe. No suspicious pulmonary nodules or masses identified. No evidence of acute infiltrate or pleural effusion. Upper Abdomen: Numerous tiny calcified gallstones are again seen, however there is n

## 2021-11-19 NOTE — Progress Notes (Signed)
?    [place] ?Telephone:(336) [phone]   [phone] ? ?OFFICE PROGRESS NOTE ? ?[name], Shyrl Numbers, MD ?[address] ?[place] [address] ? ?DIAGNOSIS: Stage IIA (T2b, N0, M0) non-small cell lung cancer consistent with squamous cell carcinoma diagnosed in January 2016 ? ?PRIOR THERAPY:  ?1) Status post left VATS with left lower lobectomy and mediastinal lymph node dissection under the care of Dr. Prescott Gum on 08/10/2014. ?2) Adjuvant systemic chemotherapy with carboplatin for AUC of 5 on day 1 and gemcitabine 1000 MG/M2 on days 1 and 8 every 3 weeks. First dose 09/19/2014. ? ?CURRENT THERAPY: Observation. ? ?INTERVAL HISTORY: ?Jeremy Deleon 88 y.o. male returns to the clinic today for annual follow-up visit.  The patient is feeling fine today with no concerning complaints.  He denied having any current chest pain, shortness of breath, cough or hemoptysis.  He has no nausea, vomiting, diarrhea or constipation.  He continues to have arthralgia and weakness in his lower extremities.  He is here today for evaluation with repeat CT scan of the chest for restaging of his disease. ? ?MEDICAL HISTORY: ?Past Medical History:  ?Diagnosis Date  ? Basal cell carcinoma   ? BPH (benign prostatic hyperplasia)   ? CAD S/P percutaneous coronary angioplasty 07/25/2004  ? Cardiologist-- Dr Ellyn Hack - Jan '06Abnormal Myoview with inferolateral ischemia: --> per cardiac cath 09-16-2004 -- 80% prox LAD lesion PCI: Taxus DES 3.0 mm x 16 mm-;; Myoview June 2009 no ischemia or infarction with attenuation artifact  ? Chronic constipation   ? Dyslipidemia, goal LDL below 70   ? on simvastatin and tricor  ? Emphysema of lung ([place])   ? Full dentures   ? GERD (gastroesophageal reflux disease)   ? Graves' disease 11/ 2016 dx  ? endocrinologist-  dr Renato Shin-- treated with tapazole until 2018 when labs were normal  ? History of basal cell carcinoma (BCC) excision   ? Hypertension   ? Left  hydrocele   ? Mild atherosclerosis of both carotid arteries   ? duplex 09-09-2016  1-39% bilateral ICA  ? Osteoarthritis   ? S/P drug eluting coronary stent placement 09/16/2004  ? x1  to prox. LAD  ? Squamous cell lung cancer : Left lower lobe 07/2014  ? - oncologist-  dr Julien Nordmann---  per lov note in epic no recurrence: s/p  Bronchoscopy showed extrinsic compression of the left lower lobe with endobronchial biopsies were negative. He subsequently had a needle biopsy which dx the mass as squamous cell carcinoma /  08-10-2014  s/p  Left VATS w/ LLlobectomy with node dissections and completed chemotherapy (11/2014)  ? Wears glasses   ? Wears hearing aid in both ears   ? ? ?ALLERGIES:  is allergic to niaspan [niacin er]. ? ?MEDICATIONS:  ?Current Outpatient Medications  ?Medication Sig Dispense Refill  ? aspirin EC 81 MG tablet Take 81 mg by mouth daily.    ? clobetasol cream (TEMOVATE) 1.60 % Apply 1 application topically 2 (two) times daily. 30 g 0  ? fenofibrate (TRICOR) 145 MG tablet Take by mouth.    ? fenofibrate (TRICOR) 145 MG tablet TAKE 1 TABLET(145 MG) BY MOUTH DAILY 90 tablet 3  ? finasteride (PROSCAR) 5 MG tablet     ? hydrocortisone cream 1 % Apply to affected area 2 times daily 15 g 0  ? isosorbide mononitrate (IMDUR) 30 MG 24 hr tablet TAKE 1 TABLET(30 MG) BY MOUTH DAILY 90 tablet 3  ?  levothyroxine (SYNTHROID, LEVOTHROID) 50 MCG tablet Take 1 tablet by mouth daily.    ? rosuvastatin (CRESTOR) 40 MG tablet TAKE 1 TABLET(40 MG) BY MOUTH DAILY 90 tablet 3  ? traMADol (ULTRAM) 50 MG tablet Take 50 mg by mouth 2 (two) times daily as needed.    ? valACYclovir (VALTREX) 1000 MG tablet Take 1 tablet (1,000 mg total) by mouth 2 (two) times daily. 1 po tid 21 tablet 0  ? ?No current facility-administered medications for this visit.  ? ? ?SURGICAL HISTORY:  ?Past Surgical History:  ?Procedure Laterality Date  ? APPENDECTOMY  child  ? CATARACT EXTRACTION W/ INTRAOCULAR LENS  IMPLANT, BILATERAL  2016 & 2017  ?  HYDROCELE EXCISION Left 03/12/2018  ? Procedure: HYDROCELECTOMY ADULT;  Surgeon: Ardis Hughs, MD;  Location: [facility];  Service: Urology;  Laterality: Left;  ? NM MYOVIEW LTD  January 06, 2008   ? treadmill  myoview - tissue attenuation but no ischemia or infarction  ? PERCUTANEOUS CORONARY STENT INTERVENTION (PCI-S)  2/27/ 2006    dr Tami Ribas  @[facility]   ? (07-25-2004 positive cardiolite for ischemia)  balloon angioplasty and TAXUS DES 3.0 mm x 16 mm stent ->  PROX  LAD (80%)  ? TRANSTHORACIC ECHOCARDIOGRAM  09/16/2016  ? Limited echo with bubble study: EF 60- 65%, GR 1 DD./ Negative bubble study/  mild AR/  mild dilated ascending aorta/ No regional wall motion abnormality  ? UMBILICAL HERNIA REPAIR  11-23-2007    dr Rise Patience   @[facility]   ? VIDEO ASSISTED THORACOSCOPY (VATS)/ LOBECTOMY Left 08/10/2014  ? Procedure: VIDEO ASSISTED THORACOSCOPY (VATS)/ LOBECTOMY;  Surgeon: Ivin Poot, MD;  Location: [place];  Service: Thoracic;  Laterality: Left;  ? VIDEO BRONCHOSCOPY N/A 08/10/2014  ? Procedure: VIDEO BRONCHOSCOPY;  Surgeon: Ivin Poot, MD;  Location: [facility] OR;  Service: Thoracic;  Laterality: N/A;  ? ? ?REVIEW OF SYSTEMS:  A comprehensive review of systems was negative.  ? ?PHYSICAL EXAMINATION: General appearance: alert, cooperative and no distress ?Head: Normocephalic, without obvious abnormality, atraumatic ?Neck: no adenopathy, no JVD, supple, symmetrical, trachea midline and thyroid not enlarged, symmetric, no tenderness/mass/nodules ?Lymph nodes: Cervical, supraclavicular, and axillary nodes normal. ?Resp: clear to auscultation bilaterally ?Back: symmetric, no curvature. ROM normal. No CVA tenderness. ?Cardio: normal apical impulse ?GI: soft, non-tender; bowel sounds normal; no masses,  no organomegaly ?Extremities: extremities normal, atraumatic, no cyanosis or edema ? ?ECOG PERFORMANCE STATUS: 1 - Symptomatic but completely ambulatory ? ?Blood pressure (!) 147/70, pulse 77, temperature 97.8  ?F (36.6 ?C), resp. rate 18, weight 215 lb 4 oz (97.6 kg), SpO2 96 %. ? ?LABORATORY DATA: ?Lab Results  ?Component Value Date  ? WBC 6.4 11/18/2021  ? HGB 15.4 11/18/2021  ? HCT 45.9 11/18/2021  ? MCV 93.1 11/18/2021  ? PLT 186 11/18/2021  ? ? ?  Chemistry   ?   ?Component Value Date/Time  ? NA 139 11/18/2021 0907  ? NA 140 11/22/2019 0842  ? NA 141 03/24/2017 1018  ? K 4.0 11/18/2021 0907  ? K 4.2 03/24/2017 1018  ? CL 106 11/18/2021 0907  ? CO2 29 11/18/2021 0907  ? CO2 25 03/24/2017 1018  ? BUN 15 11/18/2021 0907  ? BUN 21 11/22/2019 0842  ? BUN 16.5 03/24/2017 1018  ? CREATININE 1.14 11/18/2021 0907  ? CREATININE 1.2 03/24/2017 1018  ?    ?Component Value Date/Time  ? CALCIUM 9.3 11/18/2021 0907  ? CALCIUM 9.6 03/24/2017 1018  ? ALKPHOS 53  11/18/2021 0907  ? ALKPHOS 87 03/24/2017 1018  ? AST 28 11/18/2021 0907  ? AST 30 03/24/2017 1018  ? ALT 14 11/18/2021 0907  ? ALT 17 03/24/2017 1018  ? BILITOT 0.7 11/18/2021 0907  ? BILITOT 0.56 03/24/2017 1018  ?  ? ? ? ?RADIOGRAPHIC STUDIES: ?CT Chest W Contrast ? ?Result Date: 11/18/2021 ?CLINICAL DATA:  Non-small cell lung carcinoma. Previous left lower lobectomy and chemotherapy. EXAM: CT CHEST WITH CONTRAST TECHNIQUE: Multidetector CT imaging of the chest was performed during intravenous contrast administration. RADIATION DOSE REDUCTION: This exam was performed according to the departmental dose-optimization program which includes automated exposure control, adjustment of the mA and/or kV according to patient size and/or use of iterative reconstruction technique. CONTRAST:  47mL OMNIPAQUE IOHEXOL 300 MG/ML  SOLN COMPARISON:  11/19/2020 FINDINGS: Cardiovascular: No acute findings. Aortic and coronary atherosclerotic calcification noted. Mediastinum/Nodes: No masses or pathologically enlarged lymph nodes identified. Lungs/Pleura: Stable postop changes from prior left lower lobectomy. Calcified granuloma again seen in the lateral right upper lobe. No suspicious pulmonary  nodules or masses identified. No evidence of acute infiltrate or pleural effusion. Upper Abdomen: Numerous tiny calcified gallstones are again seen, however there is no evidence of cholecystitis.1 Musculoskeletal:

## 2021-11-24 ENCOUNTER — Other Ambulatory Visit: Payer: Self-pay | Admitting: Cardiology

## 2021-12-09 ENCOUNTER — Encounter: Payer: Self-pay | Admitting: Cardiology

## 2021-12-09 NOTE — Assessment & Plan Note (Signed)
Was given the diagnosis of hypertension, but is now on no medications with well-controlled blood pressures.  No longer on any ACE inhibitor or ARB.  Feeling better.  No further syncopal episodes.

## 2021-12-09 NOTE — Assessment & Plan Note (Signed)
17 years out from [place] to the proximal LAD for abnormal Myoview.  Most recent Myoview was in 2009-nonischemic.  No recurrent anginal symptoms. On minimal therapy: Aspirin 81 mg daily, rosuvastatin 40 Miller daily and Imdu along with fenofibrate 145 mg

## 2021-12-09 NOTE — Assessment & Plan Note (Addendum)
LDL actually looks great as of December.  Improved from the year before.    On stable dose of 40 mg rosuvastatin plus fenofibrate 145 mg.  Tolerating well.  No change

## 2021-12-09 NOTE — Assessment & Plan Note (Signed)
Would prefer to avoid a diuretic.  Best course of action is to continue foot elevation and support stockings as tolerated. With no PND orthopnea, it is probably not related to any CHF.

## 2021-12-09 NOTE — Assessment & Plan Note (Signed)
We will do another scan to avoid hypotension.  Allowing permissive hypertension.  Not on any medications that would affect blood pressure other than Imdur.  Continue to encourage adequate hydration and nutrition.

## 2021-12-09 NOTE — Assessment & Plan Note (Signed)
No further episodes.  Allowing permissive hypertension.  Continue to stress importance of adequate hydration and nutrition.  Diuretic discontinued. Continue to stay active and mobile to maintain strength.

## 2021-12-17 ENCOUNTER — Other Ambulatory Visit: Payer: Self-pay | Admitting: Cardiology

## 2021-12-26 DIAGNOSIS — R351 Nocturia: Secondary | ICD-10-CM | POA: Diagnosis not present

## 2021-12-26 DIAGNOSIS — R3915 Urgency of urination: Secondary | ICD-10-CM | POA: Diagnosis not present

## 2021-12-26 DIAGNOSIS — R350 Frequency of micturition: Secondary | ICD-10-CM | POA: Diagnosis not present

## 2021-12-26 DIAGNOSIS — N401 Enlarged prostate with lower urinary tract symptoms: Secondary | ICD-10-CM | POA: Diagnosis not present

## 2022-01-07 DIAGNOSIS — Z0000 Encounter for general adult medical examination without abnormal findings: Secondary | ICD-10-CM | POA: Diagnosis not present

## 2022-01-07 DIAGNOSIS — C3492 Malignant neoplasm of unspecified part of left bronchus or lung: Secondary | ICD-10-CM | POA: Diagnosis not present

## 2022-01-14 DIAGNOSIS — H02135 Senile ectropion of left lower eyelid: Secondary | ICD-10-CM | POA: Diagnosis not present

## 2022-01-14 DIAGNOSIS — Z961 Presence of intraocular lens: Secondary | ICD-10-CM | POA: Diagnosis not present

## 2022-01-14 DIAGNOSIS — H532 Diplopia: Secondary | ICD-10-CM | POA: Diagnosis not present

## 2022-01-14 DIAGNOSIS — H02132 Senile ectropion of right lower eyelid: Secondary | ICD-10-CM | POA: Diagnosis not present

## 2022-02-13 DIAGNOSIS — H524 Presbyopia: Secondary | ICD-10-CM | POA: Diagnosis not present

## 2022-02-17 DIAGNOSIS — N401 Enlarged prostate with lower urinary tract symptoms: Secondary | ICD-10-CM | POA: Diagnosis not present

## 2022-02-17 DIAGNOSIS — R351 Nocturia: Secondary | ICD-10-CM | POA: Diagnosis not present

## 2022-04-30 ENCOUNTER — Ambulatory Visit: Payer: Medicare Other | Admitting: Dermatology

## 2022-05-02 DIAGNOSIS — H6123 Impacted cerumen, bilateral: Secondary | ICD-10-CM | POA: Diagnosis not present

## 2022-05-02 DIAGNOSIS — Z23 Encounter for immunization: Secondary | ICD-10-CM | POA: Diagnosis not present

## 2022-05-13 DIAGNOSIS — M542 Cervicalgia: Secondary | ICD-10-CM | POA: Diagnosis not present

## 2022-05-13 DIAGNOSIS — R29898 Other symptoms and signs involving the musculoskeletal system: Secondary | ICD-10-CM | POA: Diagnosis not present

## 2022-05-20 DIAGNOSIS — M542 Cervicalgia: Secondary | ICD-10-CM | POA: Diagnosis not present

## 2022-05-20 DIAGNOSIS — R29898 Other symptoms and signs involving the musculoskeletal system: Secondary | ICD-10-CM | POA: Diagnosis not present

## 2022-05-22 DIAGNOSIS — R29898 Other symptoms and signs involving the musculoskeletal system: Secondary | ICD-10-CM | POA: Diagnosis not present

## 2022-05-22 DIAGNOSIS — M542 Cervicalgia: Secondary | ICD-10-CM | POA: Diagnosis not present

## 2022-05-27 DIAGNOSIS — R29898 Other symptoms and signs involving the musculoskeletal system: Secondary | ICD-10-CM | POA: Diagnosis not present

## 2022-05-27 DIAGNOSIS — M542 Cervicalgia: Secondary | ICD-10-CM | POA: Diagnosis not present

## 2022-05-29 DIAGNOSIS — R29898 Other symptoms and signs involving the musculoskeletal system: Secondary | ICD-10-CM | POA: Diagnosis not present

## 2022-05-29 DIAGNOSIS — M542 Cervicalgia: Secondary | ICD-10-CM | POA: Diagnosis not present

## 2022-07-20 ENCOUNTER — Other Ambulatory Visit: Payer: Self-pay | Admitting: Cardiology

## 2022-08-13 DIAGNOSIS — H6123 Impacted cerumen, bilateral: Secondary | ICD-10-CM | POA: Diagnosis not present

## 2022-08-25 DIAGNOSIS — E559 Vitamin D deficiency, unspecified: Secondary | ICD-10-CM | POA: Diagnosis not present

## 2022-08-25 DIAGNOSIS — I10 Essential (primary) hypertension: Secondary | ICD-10-CM | POA: Diagnosis not present

## 2022-08-25 DIAGNOSIS — E782 Mixed hyperlipidemia: Secondary | ICD-10-CM | POA: Diagnosis not present

## 2022-08-25 DIAGNOSIS — I2510 Atherosclerotic heart disease of native coronary artery without angina pectoris: Secondary | ICD-10-CM | POA: Diagnosis not present

## 2022-08-25 DIAGNOSIS — C3492 Malignant neoplasm of unspecified part of left bronchus or lung: Secondary | ICD-10-CM | POA: Diagnosis not present

## 2022-09-16 ENCOUNTER — Emergency Department (HOSPITAL_COMMUNITY)
Admission: EM | Admit: 2022-09-16 | Discharge: 2022-09-17 | Disposition: A | Discharge status: Home / Self Care | Payer: Medicare Other | Attending: Emergency Medicine | Admitting: Emergency Medicine

## 2022-09-16 ENCOUNTER — Emergency Department (HOSPITAL_COMMUNITY): Payer: Medicare Other

## 2022-09-16 ENCOUNTER — Encounter (HOSPITAL_COMMUNITY): Payer: Self-pay | Admitting: Emergency Medicine

## 2022-09-16 ENCOUNTER — Other Ambulatory Visit: Payer: Self-pay

## 2022-09-16 DIAGNOSIS — Z85118 Personal history of other malignant neoplasm of bronchus and lung: Secondary | ICD-10-CM | POA: Insufficient documentation

## 2022-09-16 DIAGNOSIS — W19XXXA Unspecified fall, initial encounter: Secondary | ICD-10-CM | POA: Insufficient documentation

## 2022-09-16 DIAGNOSIS — Y92002 Bathroom of unspecified non-institutional (private) residence single-family (private) house as the place of occurrence of the external cause: Secondary | ICD-10-CM | POA: Diagnosis not present

## 2022-09-16 DIAGNOSIS — R410 Disorientation, unspecified: Secondary | ICD-10-CM | POA: Diagnosis not present

## 2022-09-16 DIAGNOSIS — E860 Dehydration: Secondary | ICD-10-CM | POA: Insufficient documentation

## 2022-09-16 DIAGNOSIS — Z1152 Encounter for screening for COVID-19: Secondary | ICD-10-CM | POA: Diagnosis not present

## 2022-09-16 DIAGNOSIS — I2510 Atherosclerotic heart disease of native coronary artery without angina pectoris: Secondary | ICD-10-CM | POA: Diagnosis not present

## 2022-09-16 DIAGNOSIS — I959 Hypotension, unspecified: Secondary | ICD-10-CM | POA: Diagnosis not present

## 2022-09-16 DIAGNOSIS — R739 Hyperglycemia, unspecified: Secondary | ICD-10-CM | POA: Insufficient documentation

## 2022-09-16 DIAGNOSIS — Z7982 Long term (current) use of aspirin: Secondary | ICD-10-CM | POA: Diagnosis not present

## 2022-09-16 DIAGNOSIS — D72829 Elevated white blood cell count, unspecified: Secondary | ICD-10-CM | POA: Insufficient documentation

## 2022-09-16 DIAGNOSIS — R748 Abnormal levels of other serum enzymes: Secondary | ICD-10-CM | POA: Insufficient documentation

## 2022-09-16 DIAGNOSIS — Z043 Encounter for examination and observation following other accident: Secondary | ICD-10-CM | POA: Diagnosis not present

## 2022-09-16 DIAGNOSIS — R531 Weakness: Secondary | ICD-10-CM | POA: Diagnosis not present

## 2022-09-16 DIAGNOSIS — Z79899 Other long term (current) drug therapy: Secondary | ICD-10-CM | POA: Insufficient documentation

## 2022-09-16 DIAGNOSIS — M47812 Spondylosis without myelopathy or radiculopathy, cervical region: Secondary | ICD-10-CM | POA: Diagnosis not present

## 2022-09-16 DIAGNOSIS — E871 Hypo-osmolality and hyponatremia: Secondary | ICD-10-CM | POA: Diagnosis not present

## 2022-09-16 DIAGNOSIS — I10 Essential (primary) hypertension: Secondary | ICD-10-CM | POA: Insufficient documentation

## 2022-09-16 LAB — URINALYSIS, ROUTINE W REFLEX MICROSCOPIC
Bacteria, UA: NONE SEEN
Bilirubin Urine: NEGATIVE
Glucose, UA: NEGATIVE mg/dL
Ketones, ur: NEGATIVE mg/dL
Leukocytes,Ua: NEGATIVE
Nitrite: NEGATIVE
Protein, ur: NEGATIVE mg/dL
Specific Gravity, Urine: 1.013 (ref 1.005–1.030)
pH: 6 (ref 5.0–8.0)

## 2022-09-16 LAB — CBC WITH DIFFERENTIAL/PLATELET
Abs Immature Granulocytes: 0.06 10*3/uL (ref 0.00–0.07)
Basophils Absolute: 0 10*3/uL (ref 0.0–0.1)
Basophils Relative: 0 %
Eosinophils Absolute: 0.1 10*3/uL (ref 0.0–0.5)
Eosinophils Relative: 1 %
HCT: 42.5 % (ref 39.0–52.0)
Hemoglobin: 14.2 g/dL (ref 13.0–17.0)
Immature Granulocytes: 1 %
Lymphocytes Relative: 10 %
Lymphs Abs: 1.2 10*3/uL (ref 0.7–4.0)
MCH: 31.3 pg (ref 26.0–34.0)
MCHC: 33.4 g/dL (ref 30.0–36.0)
MCV: 93.6 fL (ref 80.0–100.0)
Monocytes Absolute: 1 10*3/uL (ref 0.1–1.0)
Monocytes Relative: 8 %
Neutro Abs: 9.5 10*3/uL — ABNORMAL HIGH (ref 1.7–7.7)
Neutrophils Relative %: 80 %
Platelets: 161 10*3/uL (ref 150–400)
RBC: 4.54 MIL/uL (ref 4.22–5.81)
RDW: 14.3 % (ref 11.5–15.5)
WBC: 11.9 10*3/uL — ABNORMAL HIGH (ref 4.0–10.5)
nRBC: 0 % (ref 0.0–0.2)

## 2022-09-16 LAB — CBG MONITORING, ED: Glucose-Capillary: 122 mg/dL — ABNORMAL HIGH (ref 70–99)

## 2022-09-16 LAB — ETHANOL: Alcohol, Ethyl (B): <10 mg/dL (ref <10)

## 2022-09-16 MED ORDER — LACTATED RINGERS IV BOLUS
500.0000 mL | Freq: Once | INTRAVENOUS | Status: AC
  Administered 2022-09-16: 500 mL via INTRAVENOUS

## 2022-09-16 MED ORDER — LACTATED RINGERS IV BOLUS: 1000.0000 mL | Freq: Once | INTRAVENOUS | Status: DC

## 2022-09-16 NOTE — ED Provider Notes (Signed)
[name]   CSN: [id number] Arrival date & time: 09/16/22  2214     History {Add pertinent medical, surgical, social history, OB history to HPI:1} Chief Complaint  Patient presents with   Fall   Weakness    Jeremy Deleon is a 89 y.o. male who presents via EMS with concern for unwitnessed fall around 430 this afternoon.  Reporting generalized weakness, states that he is fatigued feels like his not been drinking enough.  States that his knees give out secondary to chronic knee pain.  States he was leaving the bathroom when he fell today but denies head trauma.  States that he came in tonight because his son came over and pulled him he wanted him to come to the hospital for IV fluids.  Patient denies any pain, shortness of breath, palpitations, chest pain at this time.  Does endorse generally feeling tired and a few episodes of lightheadedness over the last couple of days but no dizziness blurry vision weakness or difficulty speaking.  I personally reviewed his medical records.  He is history of hypertension, dyslipidemia, CAD, lung cancer stage II of the left lower lobe, completed treatment in 2016, Graves' disease.  No anticoagulation.  HPI     Home Medications Prior to Admission medications   Medication Sig Start Date End Date Taking? Authorizing Provider  aspirin EC 81 MG tablet Take 81 mg by mouth daily.    [provider]  clobetasol cream (TEMOVATE) [id number] % Apply 1 application topically 2 (two) times daily. 05/23/21   Lavonna Monarch, MD  fenofibrate (TRICOR) 145 MG tablet TAKE 1 TABLET(145 MG) BY MOUTH DAILY 07/21/22   Leonie Man, MD  finasteride (PROSCAR) 5 MG tablet  01/08/21   [provider]  hydrocortisone cream 1 % Apply to affected area 2 times daily 11/25/20   Blanchie Dessert, MD  isosorbide mononitrate (IMDUR) 30 MG 24 hr tablet TAKE 1 TABLET(30 MG) BY MOUTH DAILY 11/25/21   Leonie Man, MD   levothyroxine (SYNTHROID, LEVOTHROID) 50 MCG tablet Take 1 tablet by mouth daily. 06/28/18   [provider]  rosuvastatin (CRESTOR) 40 MG tablet TAKE 1 TABLET(40 MG) BY MOUTH DAILY 12/18/21   Leonie Man, MD  traMADol (ULTRAM) 50 MG tablet Take 50 mg by mouth 2 (two) times daily as needed. 10/20/19   [provider]  valACYclovir (VALTREX) 1000 MG tablet Take 1 tablet (1,000 mg total) by mouth 2 (two) times daily. 1 po tid 05/23/21   Lavonna Monarch, MD      Allergies    Doxycycline and Niaspan [niacin er]    Review of Systems   Review of Systems  Constitutional: Negative.   HENT: Negative.    Respiratory: Negative.    Cardiovascular: Negative.   Gastrointestinal: Negative.   Neurological:  Positive for weakness and light-headedness.    Physical Exam Updated Vital Signs BP (!) 172/78   Pulse 83   Resp 18   SpO2 96%  Physical Exam Vitals and nursing note reviewed.  Constitutional:      Appearance: He is not ill-appearing or toxic-appearing.  HENT:     Head: Normocephalic and atraumatic.     Mouth/Throat:     Mouth: Mucous membranes are moist.     Pharynx: No oropharyngeal exudate or posterior oropharyngeal erythema.  Eyes:     General:        Right eye: No discharge.  Left eye: No discharge.     Extraocular Movements: Extraocular movements intact.     Conjunctiva/sclera: Conjunctivae normal.     Pupils: Pupils are equal, round, and reactive to light.  Cardiovascular:     Rate and Rhythm: Normal rate and regular rhythm.     Pulses: Normal pulses.     Heart sounds: Normal heart sounds. No murmur heard. Pulmonary:     Effort: Pulmonary effort is normal. No respiratory distress.     Breath sounds: Normal breath sounds. No wheezing or rales.  Abdominal:     General: Bowel sounds are normal. There is no distension.     Palpations: Abdomen is soft.     Tenderness: There is no abdominal tenderness. There is no guarding or rebound.  Musculoskeletal:         General: No deformity.     Cervical back: Neck supple.     Right lower leg: No edema.     Left lower leg: No edema.  Skin:    General: Skin is warm and dry.     Capillary Refill: Capillary refill takes less than 2 seconds.  Neurological:     General: No focal deficit present.     Mental Status: He is alert and oriented to person, place, and time. Mental status is at baseline.     GCS: GCS eye subscore is 4. GCS verbal subscore is 4. GCS motor subscore is 6.  Psychiatric:        Mood and Affect: Mood normal.     ED Results / Procedures / Treatments   Labs (all labs ordered are listed, but only abnormal results are displayed) Labs Reviewed - No data to display  EKG None  Radiology No results found.  Procedures Procedures  {Document cardiac monitor, telemetry assessment procedure when appropriate:1}  Medications Ordered in ED Medications - No data to display  ED Course/ Medical Decision Making/ A&P   {   Click here for ABCD2, HEART and other calculatorsREFRESH Note before signing :1}                          Medical Decision Making 89 year old male presents after unwitnessed fall at home today due to persistent weakness and intermittent lightheadedness.  Hypertensive on intake vital signs otherwise normal.  Cardiopulmonary exam is normal, abdominal exam is benign.  Patient with GCS of 14.  Able to follow commands.  Nonfocal neurologic exam.  Amount and/or Complexity of Data Reviewed Labs: ordered. Radiology: ordered.   ***  {Document critical care time when appropriate:1} {Document review of labs and clinical decision tools ie heart score, Chads2Vasc2 etc:1}  {Document your independent review of radiology images, and any outside records:1} {Document your discussion with family members, caretakers, and with consultants:1} {Document social determinants of health affecting pt's care:1} {Document your decision making why or why not admission, treatments were  needed:1} Final Clinical Impression(s) / ED Diagnoses Final diagnoses:  None    Rx / DC Orders ED Discharge Orders     None

## 2022-09-16 NOTE — ED Triage Notes (Signed)
Pt arrives from home via EMS w/ c/o unwitnessed fall around 4:30. Pt also reports weakness Denies injuries, denies hitting head. Denies thinners Positive for orthostatics.  20 L wrist  500ccs en route VSS

## 2022-09-17 LAB — RESP PANEL BY RT-PCR (RSV, FLU A&B, COVID)  RVPGX2
Influenza A by PCR: NEGATIVE
Influenza B by PCR: NEGATIVE
Resp Syncytial Virus by PCR: NEGATIVE
SARS Coronavirus 2 by RT PCR: NEGATIVE

## 2022-09-17 LAB — COMPREHENSIVE METABOLIC PANEL
ALT: 14 U/L (ref 0–44)
AST: 37 U/L (ref 15–41)
Albumin: 3.5 g/dL (ref 3.5–5.0)
Alkaline Phosphatase: 50 U/L (ref 38–126)
Anion gap: 10 (ref 5–15)
BUN: 16 mg/dL (ref 8–23)
CO2: 23 mmol/L (ref 22–32)
Calcium: 8.4 mg/dL — ABNORMAL LOW (ref 8.9–10.3)
Chloride: 100 mmol/L (ref 98–111)
Creatinine, Ser: 0.99 mg/dL (ref 0.61–1.24)
GFR, Estimated: >60 mL/min (ref >60)
Glucose, Bld: 123 mg/dL — ABNORMAL HIGH (ref 70–99)
Potassium: 3.7 mmol/L (ref 3.5–5.1)
Sodium: 133 mmol/L — ABNORMAL LOW (ref 135–145)
Total Bilirubin: 0.9 mg/dL (ref 0.3–1.2)
Total Protein: 6.6 g/dL (ref 6.5–8.1)

## 2022-09-17 LAB — TROPONIN I (HIGH SENSITIVITY)
Troponin I (High Sensitivity): 6 ng/L (ref <18)
Troponin I (High Sensitivity): 7 ng/L (ref <18)

## 2022-09-17 LAB — RAPID URINE DRUG SCREEN, HOSP PERFORMED
Amphetamines: NOT DETECTED
Barbiturates: NOT DETECTED
Benzodiazepines: NOT DETECTED
Cocaine: NOT DETECTED
Opiates: NOT DETECTED
Tetrahydrocannabinol: NOT DETECTED

## 2022-09-17 LAB — CK: Total CK: 477 U/L — ABNORMAL HIGH (ref 49–397)

## 2022-09-17 NOTE — ED Notes (Signed)
Patient ambulated around the room with  walker, no difficulty

## 2022-09-17 NOTE — Discharge Instructions (Signed)
You were seen in the ER today for your weakness.  Suspect you were dehydrated.  Please increase your fluid intake at home.  You should be using your walker to walk around your home and outside of the home to prevent you from having any more falls.  Follow-up with your primary care doctor and return to the ER with any new severe symptoms.

## 2022-09-18 LAB — URINE CULTURE: Culture: <10000 — AB

## 2022-09-23 ENCOUNTER — Ambulatory Visit: Payer: Medicare Other | Admitting: Dermatology

## 2022-10-07 ENCOUNTER — Ambulatory Visit: Payer: Medicare Other | Admitting: Dermatology

## 2022-10-07 DIAGNOSIS — Z85828 Personal history of other malignant neoplasm of skin: Secondary | ICD-10-CM

## 2022-10-07 DIAGNOSIS — L570 Actinic keratosis: Secondary | ICD-10-CM

## 2022-10-07 DIAGNOSIS — L578 Other skin changes due to chronic exposure to nonionizing radiation: Secondary | ICD-10-CM

## 2022-10-07 DIAGNOSIS — L219 Seborrheic dermatitis, unspecified: Secondary | ICD-10-CM | POA: Diagnosis not present

## 2022-10-07 NOTE — Patient Instructions (Addendum)
  Post- Liquid Nitrogen Home Care Instructions  Apply Vaseline to all of the areas on face and body that were treated with Liquid Nitrogen

## 2022-10-08 NOTE — Progress Notes (Unsigned)
   New Patient Visit  Subjective  Jeremy Deleon is a 89 y.o. male who presents for the following: Spot check (Both temples. Was previously seeing dermatologist but office closed. Had surgery behind ear for a spot removal. Hx of skin cancer).    Objective  Well appearing patient in no apparent distress; mood and affect are within normal limits.  A focused examination was performed including face. Relevant physical exam findings are noted in the Assessment and Plan.  Left Ear, Mid Frontal Scalp, Right Ear Plan: Counseling I counseled the patient regarding the following: Skin care: Emollients, shampoos with tar, selenium or zinc pyrithione can improve seborrheic dermatitis. Expectations: Seborrheic Dermatitis is chronic in nature with periods of remissions and flares. Flares can be triggered by stress. Contact office if: Seborrheic dermatitis worsens, or fails to improve despite several months of treatment.  I recommended the following: Ketoconazole shampoo  The following medication counseling was provided: I discussed with the patient that prolonged use of topical steroids can result in the increased appearance of superficial blood vessels (telangiectasias), lightening (hypopigmentation) and thinning of the skin (atrophy). Patient understands to avoid using high potency steroids in skin folds, the groin or the face. The patient verbalized understanding of the proper use and possible adverse effects of topical steroids. All of the patient's questions and concerns were addressed.   Left Dorsal Hand, Left Preauricular Area (3), Right Dorsal Hand, Right Preauricular Area (7) Erythematous thin papules/macules with gritty scale.    Assessment & Plan  Seborrheic dermatitis Left Ear; Right Ear; Mid Frontal Scalp  Plan: Counseling I counseled the patient regarding the following: Skin care: Emollients, shampoos with tar, selenium or zinc pyrithione can improve seborrheic  dermatitis. Expectations: Seborrheic Dermatitis is chronic in nature with periods of remissions and flares. Flares can be triggered by stress. Contact office if: Seborrheic dermatitis worsens, or fails to improve despite several months of treatment.  I recommended the following: Ketoconazole 2% shampoo  The following medication counseling was provided: I discussed with the patient that prolonged use of topical steroids can result in the increased appearance of superficial blood vessels (telangiectasias), lightening (hypopigmentation) and thinning of the skin (atrophy). Patient understands to avoid using high potency steroids in skin folds, the groin or the face. The patient verbalized understanding of the proper use and possible adverse effects of topical steroids. All of the patient's questions and concerns were addressed.    Related Medications ketoconazole (NIZORAL) 2 % shampoo Apply 1 Application topically once for 1 dose. Wash into scalp and ears and rinse. Once or twice a week. Repeat as needed  AK (actinic keratosis) (12) Left Dorsal Hand; Right Dorsal Hand; Left Preauricular Area (3); Right Preauricular Area (7)  LN2 AK spots  Destruction of lesion - Left Preauricular Area, Right Preauricular Area Complexity: simple   Destruction method: cryotherapy   Informed consent: discussed and consent obtained   Timeout:  patient name, date of birth, surgical site, and procedure verified Lesion destroyed using liquid nitrogen: Yes   Region frozen until ice ball extended beyond lesion: Yes   Outcome: patient tolerated procedure well with no complications   Post-procedure details: wound care instructions given     Return in about 6 months (around 04/09/2023) for Spot check.

## 2022-10-09 ENCOUNTER — Encounter: Payer: Self-pay | Admitting: Dermatology

## 2022-10-09 ENCOUNTER — Other Ambulatory Visit: Payer: Self-pay | Admitting: Dermatology

## 2022-10-09 DIAGNOSIS — B029 Zoster without complications: Secondary | ICD-10-CM

## 2022-10-09 MED ORDER — KETOCONAZOLE 2 % EX SHAM: 1.0000 | MEDICATED_SHAMPOO | Freq: Once | CUTANEOUS | 0 refills | Status: AC

## 2022-10-30 NOTE — Telephone Encounter (Signed)
Correct, I am not the prescribing physician for this medication and this condition was not discussed or addressed at his office visit.

## 2022-10-30 NOTE — Telephone Encounter (Signed)
I am not seeing in your notes from the last visit where this medication was addressed. Please advise before I refill. Thank you!

## 2022-11-17 ENCOUNTER — Encounter (HOSPITAL_COMMUNITY): Payer: Self-pay

## 2022-11-17 ENCOUNTER — Ambulatory Visit (HOSPITAL_COMMUNITY)
Admission: RE | Admit: 2022-11-17 | Discharge: 2022-11-17 | Disposition: A | Discharge status: Home / Self Care | Payer: Medicare Other | Source: Ambulatory Visit | Attending: Internal Medicine | Admitting: Internal Medicine

## 2022-11-17 DIAGNOSIS — C3490 Malignant neoplasm of unspecified part of unspecified bronchus or lung: Secondary | ICD-10-CM | POA: Diagnosis not present

## 2022-11-17 DIAGNOSIS — J984 Other disorders of lung: Secondary | ICD-10-CM | POA: Diagnosis not present

## 2022-11-17 DIAGNOSIS — J8410 Pulmonary fibrosis, unspecified: Secondary | ICD-10-CM | POA: Diagnosis not present

## 2022-11-17 MED ORDER — SODIUM CHLORIDE (PF) 0.9 % IJ SOLN
INTRAMUSCULAR | Status: AC
  Filled 2022-11-17: qty 50

## 2022-11-17 MED ORDER — IOHEXOL 300 MG/ML  SOLN
75.0000 mL | Freq: Once | INTRAMUSCULAR | Status: AC | PRN
  Administered 2022-11-17: 75 mL via INTRAVENOUS

## 2022-11-19 ENCOUNTER — Inpatient Hospital Stay: Payer: Medicare Other | Attending: Internal Medicine

## 2022-11-19 ENCOUNTER — Inpatient Hospital Stay (HOSPITAL_BASED_OUTPATIENT_CLINIC_OR_DEPARTMENT_OTHER): Payer: Medicare Other | Admitting: Internal Medicine

## 2022-11-19 VITALS — BP 161/77 | HR 63 | Temp 97.3°F | Resp 15 | Wt 213.9 lb

## 2022-11-19 DIAGNOSIS — Z08 Encounter for follow-up examination after completed treatment for malignant neoplasm: Secondary | ICD-10-CM | POA: Diagnosis not present

## 2022-11-19 DIAGNOSIS — C3490 Malignant neoplasm of unspecified part of unspecified bronchus or lung: Secondary | ICD-10-CM | POA: Diagnosis not present

## 2022-11-19 DIAGNOSIS — Z85118 Personal history of other malignant neoplasm of bronchus and lung: Secondary | ICD-10-CM | POA: Diagnosis not present

## 2022-11-19 LAB — CMP (CANCER CENTER ONLY)
ALT: 10 U/L (ref 0–44)
AST: 24 U/L (ref 15–41)
Albumin: 4.1 g/dL (ref 3.5–5.0)
Alkaline Phosphatase: 71 U/L (ref 38–126)
Anion gap: 4 — ABNORMAL LOW (ref 5–15)
BUN: 18 mg/dL (ref 8–23)
CO2: 30 mmol/L (ref 22–32)
Calcium: 9 mg/dL (ref 8.9–10.3)
Chloride: 107 mmol/L (ref 98–111)
Creatinine: 1.09 mg/dL (ref 0.61–1.24)
GFR, Estimated: >60 mL/min (ref >60)
Glucose, Bld: 89 mg/dL (ref 70–99)
Potassium: 3.7 mmol/L (ref 3.5–5.1)
Sodium: 141 mmol/L (ref 135–145)
Total Bilirubin: 0.6 mg/dL (ref 0.3–1.2)
Total Protein: 6.7 g/dL (ref 6.5–8.1)

## 2022-11-19 LAB — CBC WITH DIFFERENTIAL (CANCER CENTER ONLY)
Abs Immature Granulocytes: 0.03 10*3/uL (ref 0.00–0.07)
Basophils Absolute: 0.1 10*3/uL (ref 0.0–0.1)
Basophils Relative: 1 %
Eosinophils Absolute: 0.6 10*3/uL — ABNORMAL HIGH (ref 0.0–0.5)
Eosinophils Relative: 9 %
HCT: 46 % (ref 39.0–52.0)
Hemoglobin: 15.7 g/dL (ref 13.0–17.0)
Immature Granulocytes: 1 %
Lymphocytes Relative: 38 %
Lymphs Abs: 2.3 10*3/uL (ref 0.7–4.0)
MCH: 31.8 pg (ref 26.0–34.0)
MCHC: 34.1 g/dL (ref 30.0–36.0)
MCV: 93.3 fL (ref 80.0–100.0)
Monocytes Absolute: 0.5 10*3/uL (ref 0.1–1.0)
Monocytes Relative: 8 %
Neutro Abs: 2.7 10*3/uL (ref 1.7–7.7)
Neutrophils Relative %: 43 %
Platelet Count: 186 10*3/uL (ref 150–400)
RBC: 4.93 MIL/uL (ref 4.22–5.81)
RDW: 14.6 % (ref 11.5–15.5)
WBC Count: 6.2 10*3/uL (ref 4.0–10.5)
nRBC: 0 % (ref 0.0–0.2)

## 2022-11-19 NOTE — Progress Notes (Signed)
[facility] [phone]   Fax:([phone]) [phone]  OFFICE PROGRESS NOTE  [name], [name], MD [address] [place] [address]  DIAGNOSIS: Stage IIA (T2b, N0, M0) non-small cell lung cancer consistent with squamous cell carcinoma diagnosed in January 2016  PRIOR THERAPY:  1) Status post left VATS with left lower lobectomy and mediastinal lymph node dissection under the care of Dr. Donata Clay on 08/10/2014. 2) Adjuvant systemic chemotherapy with carboplatin for AUC of 5 on day 1 and gemcitabine 1000 MG/M2 on days 1 and 8 every 3 weeks. First dose 09/19/2014.  CURRENT THERAPY: Observation.  INTERVAL HISTORY: Jeremy Deleon 89 y.o. male returns to the clinic today for follow-up visit.  The patient is feeling fine today with no concerning complaints.  He was a little bit upset with the waiting time to get his scan done few days ago.  He denied having any current chest pain but has shortness of breath with exertion with no cough or hemoptysis.  He has no nausea, vomiting, diarrhea or constipation.  He has no headache or visual changes.  He is here today for evaluation with repeat CT scan of the chest.   MEDICAL HISTORY: Past Medical History:  Diagnosis Date   Basal cell carcinoma    BPH (benign prostatic hyperplasia)    CAD S/P percutaneous coronary angioplasty 07/25/2004   Cardiologist-- Dr Herbie [place] - [facility] '06Abnormal Myoview with inferolateral ischemia: --> per cardiac cath 09-16-2004 -- 80% prox LAD lesion PCI: Taxus DES 3.0 mm x 16 mm-;; Myoview June 2009 no ischemia or infarction with attenuation artifact   Chronic constipation    Dyslipidemia, goal LDL below 70    on simvastatin and tricor   Emphysema of lung (HCC)    Full dentures    GERD (gastroesophageal reflux disease)    Graves' disease 11/ 2016 dx   endocrinologist-  dr Romero Belling-- treated with tapazole until 2018 when labs were normal   History of basal cell carcinoma  (BCC) excision    Hypertension    Left hydrocele    Mild atherosclerosis of both carotid arteries    duplex 09-09-2016  1-39% bilateral ICA   Osteoarthritis    S/P drug eluting coronary stent placement 09/16/2004   x1  to prox. LAD   Squamous cell lung cancer : Left lower lobe 07/2014   - oncologist-  dr Arbutus Ped---  per lov note in epic no recurrence: s/p  Bronchoscopy showed extrinsic compression of the left lower lobe with endobronchial biopsies were negative. He subsequently had a needle biopsy which dx the mass as squamous cell carcinoma /  08-10-2014  s/p  Left VATS w/ LLlobectomy with node dissections and completed chemotherapy (11/2014)   Wears glasses    Wears hearing aid in both ears     ALLERGIES:  is allergic to doxycycline and niaspan [niacin er].  MEDICATIONS:  Current Outpatient Medications  Medication Sig Dispense Refill   aspirin EC 81 MG tablet Take 81 mg by mouth daily.     clobetasol cream (TEMOVATE) 0.05 % Apply 1 application topically 2 (two) times daily. 30 g 0   fenofibrate (TRICOR) 145 MG tablet TAKE 1 TABLET(145 MG) BY MOUTH DAILY (Patient taking differently: Take 145 mg by mouth daily.) 90 tablet 1   finasteride (PROSCAR) 5 MG tablet Take 5 mg by mouth daily.     hydrocortisone cream 1 % Apply to affected area 2 times daily 15 g 0  isosorbide mononitrate (IMDUR) 30 MG 24 hr tablet TAKE 1 TABLET(30 MG) BY MOUTH DAILY (Patient taking differently: Take 30 mg by mouth daily.) 90 tablet 3   levothyroxine (SYNTHROID, LEVOTHROID) 50 MCG tablet Take 1 tablet by mouth daily.     rosuvastatin (CRESTOR) 40 MG tablet TAKE 1 TABLET(40 MG) BY MOUTH DAILY (Patient taking differently: Take 40 mg by mouth daily.) 90 tablet 3   traMADol (ULTRAM) 50 MG tablet Take 50 mg by mouth 2 (two) times daily as needed.     valACYclovir (VALTREX) 1000 MG tablet Take 1 tablet (1,000 mg total) by mouth 2 (two) times daily. 1 po tid 21 tablet 0   No current facility-administered medications  for this visit.    SURGICAL HISTORY:  Past Surgical History:  Procedure Laterality Date   APPENDECTOMY  child   CATARACT EXTRACTION W/ INTRAOCULAR LENS  IMPLANT, BILATERAL  2016 & 2017   HYDROCELE EXCISION Left 03/12/2018   Procedure: HYDROCELECTOMY ADULT;  Surgeon: Crist Fat, MD;  Location: [facility];  Service: Urology;  Laterality: Left;   NM MYOVIEW LTD  January 06, 2008    treadmill  myoview - tissue attenuation but no ischemia or infarction   PERCUTANEOUS CORONARY STENT INTERVENTION (PCI-S)  2/27/ 2006    dr Jenne Campus  @[facility]    (07-25-2004 positive cardiolite for ischemia)  balloon angioplasty and TAXUS DES 3.0 mm x 16 mm stent ->  PROX  LAD (80%)   TRANSTHORACIC ECHOCARDIOGRAM  09/16/2016   Limited echo with bubble study: EF 60- 65%, GR 1 DD./ Negative bubble study/  mild AR/  mild dilated ascending aorta/ No regional wall motion abnormality   UMBILICAL HERNIA REPAIR  11-23-2007    dr Zachery Dakins   @[facility]    VIDEO ASSISTED THORACOSCOPY (VATS)/ LOBECTOMY Left 08/10/2014   Procedure: VIDEO ASSISTED THORACOSCOPY (VATS)/ LOBECTOMY;  Surgeon: Kerin Perna, MD;  Location: [facility] OR;  Service: Thoracic;  Laterality: Left;   VIDEO BRONCHOSCOPY N/A 08/10/2014   Procedure: VIDEO BRONCHOSCOPY;  Surgeon: Kerin Perna, MD;  Location: [facility] OR;  Service: Thoracic;  Laterality: N/A;    REVIEW OF SYSTEMS:  A comprehensive review of systems was negative except for: Constitutional: positive for fatigue   PHYSICAL EXAMINATION: General appearance: alert, cooperative and no distress Head: Normocephalic, without obvious abnormality, atraumatic Neck: no adenopathy, no JVD, supple, symmetrical, trachea midline and thyroid not enlarged, symmetric, no tenderness/mass/nodules Lymph nodes: Cervical, supraclavicular, and axillary nodes normal. Resp: clear to auscultation bilaterally Back: symmetric, no curvature. ROM normal. No CVA tenderness. Cardio: normal apical impulse GI: soft, non-tender;  bowel sounds normal; no masses,  no organomegaly Extremities: extremities normal, atraumatic, no cyanosis or edema  ECOG PERFORMANCE STATUS: 1 - Symptomatic but completely ambulatory  Blood pressure (!) 161/77, pulse 63, temperature (!) 97.3 F (36.3 C), temperature source Temporal, resp. rate 15, weight 213 lb 14.4 oz (97 kg), SpO2 100 %.  LABORATORY DATA: Lab Results  Component Value Date   WBC 11.9 (H) 09/16/2022   HGB 14.2 09/16/2022   HCT 42.5 09/16/2022   MCV 93.6 09/16/2022   PLT 161 09/16/2022      Chemistry      Component Value Date/Time   NA 133 (L) 09/16/2022 2246   NA 140 11/22/2019 0842   NA 141 03/24/2017 1018   K 3.7 09/16/2022 2246   K 4.2 03/24/2017 1018   CL 100 09/16/2022 2246   CO2 23 09/16/2022 2246   CO2 25 03/24/2017 1018   BUN 16  09/16/2022 2246   BUN 21 11/22/2019 0842   BUN 16.5 03/24/2017 1018   CREATININE 0.99 09/16/2022 2246   CREATININE 1.14 11/18/2021 0907   CREATININE 1.2 03/24/2017 1018      Component Value Date/Time   CALCIUM 8.4 (L) 09/16/2022 2246   CALCIUM 9.6 03/24/2017 1018   ALKPHOS 50 09/16/2022 2246   ALKPHOS 87 03/24/2017 1018   AST 37 09/16/2022 2246   AST 28 11/18/2021 0907   AST 30 03/24/2017 1018   ALT 14 09/16/2022 2246   ALT 14 11/18/2021 0907   ALT 17 03/24/2017 1018   BILITOT 0.9 09/16/2022 2246   BILITOT 0.7 11/18/2021 0907   BILITOT 0.56 03/24/2017 1018       RADIOGRAPHIC STUDIES: No results found.   ASSESSMENT AND PLAN:  This is a very pleasant 89 years old white male with history of stage IIA non-small cell lung cancer, squamous cell carcinoma status post left lower lobectomy with lymph node dissection followed by adjuvant systemic chemotherapy with carboplatin and gemcitabine for 4 cycles. The patient has been observation since June 2016. The patient is feeling fine today with no concerning complaints. He had repeat CT scan of the chest performed recently.  I personally and independently reviewed the  scan images and discussed results with the patient today.  The final report of this scan is still pending.  I did not see any evidence for disease recurrence or progression but I will wait for the final report for confirmation. I recommended for the patient to continue on observation and I gave him the option of follow-up with his primary care physician versus coming back in 1 year with repeat imaging studies.  He would like to keep coming to the cancer center for his routine evaluation. I will see him back for follow-up visit in 1 year with repeat CT scan of the chest. He was advised to call immediately if he has any other concerning symptoms in the interval. The patient voices understanding of current disease status and treatment options and is in agreement with the current care plan. All questions were answered. The patient knows to call the clinic with any problems, questions or concerns. We can certainly see the patient much sooner if necessary.  Disclaimer: This note was dictated with voice recognition software. Similar sounding words can inadvertently be transcribed and may not be corrected upon review.

## 2022-11-29 ENCOUNTER — Other Ambulatory Visit: Payer: Self-pay | Admitting: Cardiology

## 2022-12-01 ENCOUNTER — Other Ambulatory Visit: Payer: Self-pay | Admitting: Cardiology

## 2022-12-16 ENCOUNTER — Other Ambulatory Visit: Payer: Self-pay | Admitting: Cardiology

## 2022-12-22 ENCOUNTER — Ambulatory Visit: Payer: Medicare Other | Attending: Cardiology | Admitting: Cardiology

## 2022-12-22 ENCOUNTER — Encounter: Payer: Self-pay | Admitting: Cardiology

## 2022-12-22 VITALS — BP 134/66 | HR 72 | Ht 73.0 in | Wt 215.2 lb

## 2022-12-22 DIAGNOSIS — I10 Essential (primary) hypertension: Secondary | ICD-10-CM

## 2022-12-22 DIAGNOSIS — I2510 Atherosclerotic heart disease of native coronary artery without angina pectoris: Secondary | ICD-10-CM | POA: Diagnosis not present

## 2022-12-22 DIAGNOSIS — M7989 Other specified soft tissue disorders: Secondary | ICD-10-CM

## 2022-12-22 DIAGNOSIS — E785 Hyperlipidemia, unspecified: Secondary | ICD-10-CM | POA: Diagnosis not present

## 2022-12-22 DIAGNOSIS — R55 Syncope and collapse: Secondary | ICD-10-CM

## 2022-12-22 DIAGNOSIS — I951 Orthostatic hypotension: Secondary | ICD-10-CM | POA: Diagnosis not present

## 2022-12-22 DIAGNOSIS — Z9861 Coronary angioplasty status: Secondary | ICD-10-CM

## 2022-12-22 NOTE — Patient Instructions (Signed)

## 2022-12-22 NOTE — Progress Notes (Signed)
Primary Care Provider: Deloris Ping, MD Cardiologist: Jeremy Lemma, MD Electrophysiologist: None  Clinic Note: Chief Complaint  Patient presents with   Follow-up    Delayed annual follow-up.  Doing well.   Coronary Artery Disease    No active angina symptoms.  No CHF.    ===================================  ASSESSMENT/PLAN   Problem List Items Addressed This Visit       Cardiology Problems   Orthostatic hypotension; with baseline hypotension (Chronic)    Removed BP meds other than Imdur.  Continue to stressed the importance of maintaining adequate hydration.      Essential hypertension (Chronic)    He was given a diagnosis of hypertension But he has not had any hypotensive episodes.  We removed his blood pressure medications and he is felt much better.  He was on an ARB before we stopped it.  No further syncope.      Dyslipidemia, goal LDL below 70 (Chronic)    Lipids look pretty well-controlled on combination of fenofibrate and rosuvastatin.  No myalgias issues.  Triglycerides are still little high, but okay.      CAD S/P percutaneous coronary angioplasty -- Taxus DES in Prox LAD - Primary (Chronic)    18 years out from his LAD PCI for an abnormal Myoview.  Has not had another Myoview since 2019 that was nonischemic.  He has not had any recurrent anginal symptoms, therefore no need to do any additional studies.  Plan:  On minimal therapy with aspirin 81 mg daily, low-dose Imdur 30 mg daily (BP would not tolerate other meds) No beta-blocker or ARB because of low blood pressure and hypotension issues. He is on rosuvastatin 40 mg along with fenofibrate 145 mg daily for lipids.         Other   Syncope and collapse (Chronic)    Thankfully, no further episodes.  Allowing permissive hypertension having removed ARB and beta-blocker. Aggressive hydration.      Swelling of lower leg (Chronic)    Open to avoid diuretic.  Recommended elevation and support  stockings     ===================================  HPI:    Jeremy Deleon is a 89 y.o. male with a PMH notable for CAD-PCI of LAD in 2006 (for Abnormal Myoview), SSSCa-LLL (s/p VATS-LLL Lobectomy, & Mediastinal LN Dissection-January 2016), along with HLD who presents today for annual follow-up at the request of Jeremy Deleon, *.  September 19, 2020 for routine follow-up doing pretty well.  No chest pain or pressure with rest or exertion.  5 years out from lung cancer-no issues.  Exertional dyspnea with increased activity, but not really doing that much.  Was doing some yard work and gardening.  Able to do ADLs without issues.  Still drives and does house chores.  Frequent nocturia.  Mild puffy swelling in the feet. Allowing permissive hypertension because orthostatic dizziness.  Adequate hydration.  Beta-blocker and ACE inhibitor have been stopped.  Only on Imdur Trying to minimize medications.  Taking aspirin 81 mg daily-okay to hold for bruising.  Jeremy Deleon was last seen on 11/06/2021 for annual follow-up doing pretty well.  He is pretty stable.  He still gets short of breath if he does increase level activity but this is stable.  He is not all that active.  He gets around the house and does his ADLs.  Does all the house chores.  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  This way he can have the boxes hanging and not have to bend over to work on the "garden ". Although he noted exertional dyspnea, he denied chest pain or pressure with rest or exertion.  Stable mild swelling but usually goes down when he puts his legs up at night.  No PND orthopnea.  Rare skipped beats but no prolonged irregular heartbeats palpitations.  No syncope or near syncope.  No claudication Plan was to try to avoid diuretic. No med changes.  Recent Hospitalizations:  Jeremy Deleon, ER 2/2-28/2024: Presented via EMS with concern for unwitnessed fall at roughly  1630 p.m.  Noted generalized weakness.  Felt fatigued as if he had not been drinking enough.  His knees gave out on him due to chronic knee pain, and he fell forward while walking out of the bathroom.  His son came over and recommended he come to the hospital for IV fluid hydration.  No other symptoms.  Just felt tired and weak.  No TIA or amaurosis fugax/CVA symptoms.  No syncope or near-syncope. Treated with 500 mL NS bolus.  UOP increased. He felt much better after hydration.  Reviewed  CV studies:    The following studies were reviewed today: (if available, images/films reviewed: From Epic Chart or Care Everywhere) None:  Interval History:   Jeremy Deleon returns for delayed annual follow-up accompanied by his son.  He seems to doing pretty well.  He is not no further episodes of dehydration since February.  He is happy to announce that his follow-up chest CT from May did not show any new lesions.  There are stable changes from left-sided thoracotomy with a tiny effusion.  Scattered bilateral lung nodules that were calcified and stable from 2021.  He is still pretty active but has to walk slowly and uses a walker for long distances with a cane for most of the time.  He is not really all that active as far as exercise but tries to get around a little bit doing some gardening.  He says his legs are weak and give out on him like what happened in February.  As long as he stays hydrated he is not likely to fall but is still scared to get too far away.  He says he urinates quite frequently, but at least he knows that he is hydrated adequately.  He had been taking Proscar but that was stopped.  He denies any PND orthopnea.  No real edema.  No chest pain or pressure at rest or exertion.  No regular heartbeats palpitations.  Since February no syncope or near syncope.  No TIA/CVA/amaurosis fugax symptoms.  No claudication.  He does indicate that he is not as active as he had been.  He tires out easily and  it is really his legs to give out.  Less stamina in general but no notable exertional dyspnea or chest pain.   REVIEWED OF SYSTEMS   Pertinent Review of Symptoms as follows: Otherwise as noted in HPI or negative. Review of Systems  Constitutional:  Positive for malaise/fatigue (Not really fatigue, just exercise intolerance due to it being a lot of effort to walk.  Notes easy fatigue and less stamina which is not as active as he had been.). Negative for weight loss.  HENT:  Negative for congestion.   Respiratory:  Negative for shortness of breath.   Cardiovascular:  Positive for leg swelling (Trivial).  Genitourinary:  Positive for frequency (Frequent nocturia-or 5 times a night most nights.). Negative  for hematuria.  Musculoskeletal:  Positive for back pain and joint pain (Hips hurt, knees are weak). Negative for falls (None since February) and myalgias.       Activity limited by hip and back pain.  Also his legs give out on him.  Neurological:  Positive for dizziness (On occasion, but nothing like February.) and weakness (Bilateral legs).  Endo/Heme/Allergies:  Bruises/bleeds easily (Bruising/senile purpura).  Psychiatric/Behavioral:  Positive for memory loss. Negative for depression. The patient is not nervous/anxious and does not have insomnia (Does not sleep well because of nocturia, not because of insomnia).     I have reviewed and (if needed) personally updated the patient's problem list, medications, allergies, past medical and surgical history, social and family history.   PAST MEDICAL HISTORY   Past Medical History:  Diagnosis Date   Basal cell carcinoma    BPH (benign prostatic hyperplasia)    CAD S/P percutaneous coronary angioplasty 07/25/2004   Cardiologist-- Dr Herbie [place] - Jan '06Abnormal Myoview with inferolateral ischemia: --> per cardiac cath 09-16-2004 -- 80% prox LAD lesion PCI: Taxus DES 3.0 mm x 16 mm-;; Myoview June 2009 no ischemia or infarction with attenuation  artifact   Chronic constipation    Dyslipidemia, goal LDL below 70    on simvastatin and tricor   Emphysema of lung (HCC)    Full dentures    GERD (gastroesophageal reflux disease)    Graves' disease 11/ 2016 dx   endocrinologist-  dr Romero Belling-- treated with tapazole until 2018 when labs were normal   History of basal cell carcinoma (BCC) excision    Hypertension    Left hydrocele    Mild atherosclerosis of both carotid arteries    duplex 09-09-2016  1-39% bilateral ICA   Osteoarthritis    S/P drug eluting coronary stent placement 09/16/2004   x1  to prox. LAD   Squamous cell lung cancer : Left lower lobe 07/2014   - oncologist-  dr Arbutus Ped---  per lov note in epic no recurrence: s/p  Bronchoscopy showed extrinsic compression of the left lower lobe with endobronchial biopsies were negative. He subsequently had a needle biopsy which dx the mass as squamous cell carcinoma /  08-10-2014  s/p  Left VATS w/ LLlobectomy with node dissections and completed chemotherapy (11/2014)   Wears glasses    Wears hearing aid in both ears     PAST SURGICAL HISTORY -reviewed in epic   CV Procedure & Pertinent Surgical History:  Procedure Laterality Date   NM MYOVIEW LTD  January 06, 2008    treadmill  myoview - tissue attenuation but no ischemia or infarction   PERCUTANEOUS CORONARY STENT INTERVENTION (PCI-S)  2/27/ 2006    dr Jenne Campus  @   (07-25-2004  - Dr. Jenne Campus) => Positive cardiolite for ischemia:  PTCA- DES PCI  Prox LAD 80%: TAXUS DES 3.0 mm x 16 mm stent   TRANSTHORACIC ECHOCARDIOGRAM  09/16/2016   Limited echo with bubble study: EF 60- 65%, GR 1 DD./ Negative bubble study/  mild AR/  mild dilated ascending aorta/ No regional wall motion abnormality   VIDEO ASSISTED THORACOSCOPY (VATS)/ LOBECTOMY Left 08/10/2014   Procedure: VIDEO ASSISTED THORACOSCOPY (VATS)/ LOBECTOMY;  Surgeon: Kerin Perna, MD;  Location: [facility] OR;  Service: Thoracic;  Laterality: Left;   VIDEO BRONCHOSCOPY N/A 08/10/2014    Procedure: VIDEO BRONCHOSCOPY;  Surgeon: Kerin Perna, MD;  Location: [facility] OR;  Service: Thoracic;  Laterality: N/A;   MEDICATIONS/ALLERGIES   Current Meds  Medication  Sig   aspirin EC 81 MG tablet Take 81 mg by mouth daily.   fenofibrate (TRICOR) 145 MG tablet TAKE 1 TABLET(145 MG) BY MOUTH DAILY (Patient taking differently: Take 145 mg by mouth daily.)   isosorbide mononitrate (IMDUR) 30 MG 24 hr tablet TAKE 1 TABLET(30 MG) BY MOUTH DAILY   levothyroxine (SYNTHROID, LEVOTHROID) 50 MCG tablet Take 1 tablet by mouth daily.   rosuvastatin (CRESTOR) 40 MG tablet Take 1 tablet (40 mg total) by mouth daily.     Allergies  Allergen Reactions   Doxycycline Rash   Niaspan [Niacin Er] Rash and Other (See Comments)    flushing  And  SOCIAL HISTORY/FAMILY HISTORY   Reviewed in Epic:  Pertinent findings: Former smoker, quit in 1984. Social History   Social History Narrative    He is a widowed father of 3, grandfather of 5, still working. Does not smoke, quit 30   years ago and does not drink alcohol. 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   He does still live alone, but has lots of help.  His son comes by to see him every night.  He goes out to eat lunch every day.   Able to carry out his ADLs including driving.   Does not get much exercise because of unsteady gait and orthostatic dizziness.   He does have a living well, and has a healthcare power of attorney.    OBJCTIVE -PE, EKG, labs   Wt Readings from Last 3 Encounters:  12/22/22 215 lb 3.2 oz (97.6 kg)  11/19/22 213 lb 14.4 oz (97 kg)  11/19/21 215 lb 4 oz (97.6 kg)    Physical Exam: BP 134/66 (BP Location: Left Arm, Patient Position: Sitting, Cuff Size: Normal)   Pulse 72   Ht 6\' 1"  (1.854 m)   Wt 215 lb 3.2 oz (97.6 kg)   SpO2 96%   BMI 28.39 kg/m  Physical Exam Vitals reviewed.  Constitutional:      General: He is not in acute distress.    Appearance: Normal appearance. He is  normal weight. He is not ill-appearing or toxic-appearing.     Comments: Well-appearing for age.  Well-nourished, well-groomed.  HENT:     Head: Normocephalic and atraumatic.     Ears:     Comments: Very hard of hearing Neck:     Vascular: No carotid bruit or JVD.  Cardiovascular:     Rate and Rhythm: Normal rate and regular rhythm. No extrasystoles are present.    Chest Wall: PMI is not displaced.     Pulses: Normal pulses.     Heart sounds: S1 normal and S2 normal. No murmur heard.    No friction rub. No gallop.  Pulmonary:     Effort: Pulmonary effort is normal. No respiratory distress.     Breath sounds: Normal breath sounds. No wheezing, rhonchi or rales.  Chest:     Chest wall: No tenderness.  Abdominal:     General: Abdomen is flat. Bowel sounds are normal. There is no distension.     Palpations: Abdomen is soft. There is no mass.     Tenderness: There is no abdominal tenderness.     Comments: No HSM or bruit  Musculoskeletal:        General: No swelling. Normal range of motion.     Cervical back: Normal range of motion and neck supple.  Skin:    General: Skin is warm and dry.  Neurological:  General: No focal deficit present.     Mental Status: He is alert and oriented to person, place, and time.     Gait: Gait abnormal (Slowed, deliberate gait).  Psychiatric:        Mood and Affect: Mood normal.        Behavior: Behavior normal.        Thought Content: Thought content normal.        Judgment: Judgment normal.      Adult ECG Report  Rate: 66 ;  Rhythm: normal sinus rhythm and 1 AVB. ; Otherwise normal axis, intervals and durations.  Narrative Interpretation: Stable  Recent Labs: Due for lab recheck by PCP in June. Lab Results  Component Value Date   CREATININE 1.09 11/19/2022   BUN 18 11/19/2022   NA 141 11/19/2022   K 3.7 11/19/2022   CL 107 11/19/2022   CO2 30 11/19/2022   Lab Results  Component Value Date   WBC 6.2 11/19/2022   HGB 15.7  11/19/2022   HCT 46.0 11/19/2022   MCV 93.3 11/19/2022   PLT 186 11/19/2022   [facility]  Lipid Panel Component 08/25/22 06/24/21 07/02/20 12/21/18  Cholesterol, Total 132 131 144 118  Triglycerides 173 High  137 165 High  173 High   HDL 43 37 Low  43 35 Low   VLDL Cholesterol Cal 29 24 28  35  LDL 60 70 73 48    Comprehensive Metabolic Panel Component 08/25/22 06/24/21 01/03/21 07/02/20  Glucose 126 High  99 123 High  94  BUN 21 21 21 21   Creatinine 1.20 1.07 1.09 1.21  eGFR 59 Low  68 67 --  BUN/Creatinine Ratio 18 20 19 17   Sodium 144 140 138 140  Potassium 4.4 4.2 4.0 4.3  Chloride 103 103 100 104  CO2 26 25 23 25   CALCIUM 9.4 9.3 8.9 9.0  Total Protein 6.2 6.4 6.0 6.2  Albumin, Serum 4.1 4.0 3.8 3.9  Globulin, Total 2.1 2.4 2.2 2.3  Albumin/Globulin Ratio 2.0 1.7 1.7 1.7  Total Bilirubin 0.5 0.3 0.6 0.4  Alkaline Phosphatase 80 86 63 94   AST 27 27 20  34  ALT (SGPT) 11 12 8 15     CBC And Differential Component 08/25/22 06/24/21 01/03/21  WBC 7.6 6.6 6.9  RBC 4.82 4.59 4.49  Hemoglobin 15.6 15.1 14.2  Hematocrit 44.9 42.8 41.9  MCV 93 93 93  MCH 32.4 32.9 31.6  MCHC 34.7 35.3 33.9  RDW 13.3 14.7 13.4  Platelet Count 195 204 171    TSH Rfx on Abnormal to Free T4 Component 08/25/22 06/24/21 07/02/20 06/24/19  TSH 0.663 0.891 1.130 0.873   ==================================================  COVID-19 Education: The signs and symptoms of COVID-19 were discussed with the patient and how to seek care for testing (follow up with PCP or arrange E-visit).    I spent a total of 26 minutes with the patient spent in direct patient consultation.  Additional time spent with chart review  / charting (studies, outside notes, etc): 15 min Total Time: 41 min  Current medicines are reviewed at length with the patient today.  (+/- concerns) none  Notice: This dictation was prepared with Dragon dictation along with smart phrase technology. Any transcriptional errors  that result from this process are unintentional and may not be corrected upon review.  Studies Ordered:   No orders of the defined types were placed in this encounter. No orders of the defined types were placed in this encounter.  Patient Instructions / Medication Changes & Studies & Tests Ordered   Patient Instructions  Medication Instructions:   No changes *If you need a refill on your cardiac medications before your next appointment, please call your pharmacy*   Lab Work:  Not needed   Testing/Procedures:  Not needed  Follow-Up: At [facility], you and your health needs are our priority.  As part of our continuing mission to provide you with exceptional heart care, we have created designated Provider Care Teams.  These Care Teams include your primary Cardiologist (physician) and Advanced Practice Providers (APPs -  Physician Assistants and Nurse Practitioners) who all work together to provide you with the care you need, when you need it.     Your next appointment:   12 month(s)  The format for your next appointment:   In Person  Provider:   Bryan Lemma, MD        Jeremy Deleon, M.D., M.S. Interventional Cardiologist   Pager # [phone] Phone # [phone] [address] [place], [place] [address]   Thank you for choosing Heartcare at [facility]!!

## 2022-12-25 ENCOUNTER — Encounter: Payer: Self-pay | Admitting: Podiatry

## 2022-12-25 ENCOUNTER — Ambulatory Visit: Payer: Medicare Other | Admitting: Podiatry

## 2022-12-25 ENCOUNTER — Ambulatory Visit: Payer: Medicare Other

## 2022-12-25 DIAGNOSIS — M1990 Unspecified osteoarthritis, unspecified site: Secondary | ICD-10-CM | POA: Insufficient documentation

## 2022-12-25 DIAGNOSIS — M722 Plantar fascial fibromatosis: Secondary | ICD-10-CM

## 2022-12-25 DIAGNOSIS — M7750 Other enthesopathy of unspecified foot: Secondary | ICD-10-CM

## 2022-12-25 DIAGNOSIS — R634 Abnormal weight loss: Secondary | ICD-10-CM | POA: Insufficient documentation

## 2022-12-25 DIAGNOSIS — R4189 Other symptoms and signs involving cognitive functions and awareness: Secondary | ICD-10-CM | POA: Insufficient documentation

## 2022-12-25 DIAGNOSIS — N400 Enlarged prostate without lower urinary tract symptoms: Secondary | ICD-10-CM | POA: Insufficient documentation

## 2022-12-25 NOTE — Patient Instructions (Signed)
Look for Voltaren gel at the pharmacy over the counter or online (also known as diclofenac 1% gel). Apply to the painful areas 3-4x daily with the supplied dosing card. Allow to dry for 10 minutes before going into socks/shoes  Plantar Fasciitis (Heel Spur Syndrome) with Rehab The plantar fascia is a fibrous, ligament-like, soft-tissue structure that spans the bottom of the foot. Plantar fasciitis is a condition that causes pain in the foot due to inflammation of the tissue. SYMPTOMS  Pain and tenderness on the underneath side of the foot. Pain that worsens with standing or walking. CAUSES  Plantar fasciitis is caused by irritation and injury to the plantar fascia on the underneath side of the foot. Common mechanisms of injury include: Direct trauma to bottom of the foot. Damage to a small nerve that runs under the foot where the main fascia attaches to the heel bone. Stress placed on the plantar fascia due to bone spurs. RISK INCREASES WITH:  Activities that place stress on the plantar fascia (running, jumping, pivoting, or cutting). Poor strength and flexibility. Improperly fitted shoes. Tight calf muscles. Flat feet. Failure to warm-up properly before activity. Obesity. PREVENTION Warm up and stretch properly before activity. Allow for adequate recovery between workouts. Maintain physical fitness: Strength, flexibility, and endurance. Cardiovascular fitness. Maintain a health body weight. Avoid stress on the plantar fascia. Wear properly fitted shoes, including arch supports for individuals who have flat feet.  PROGNOSIS  If treated properly, then the symptoms of plantar fasciitis usually resolve without surgery. However, occasionally surgery is necessary.  RELATED COMPLICATIONS  Recurrent symptoms that may result in a chronic condition. Problems of the lower back that are caused by compensating for the injury, such as limping. Pain or weakness of the foot during push-off  following surgery. Chronic inflammation, scarring, and partial or complete fascia tear, occurring more often from repeated injections.  TREATMENT  Treatment initially involves the use of ice and medication to help reduce pain and inflammation. The use of strengthening and stretching exercises may help reduce pain with activity, especially stretches of the Achilles tendon. These exercises may be performed at home or with a therapist. Your caregiver may recommend that you use heel cups of arch supports to help reduce stress on the plantar fascia. Occasionally, corticosteroid injections are given to reduce inflammation. If symptoms persist for greater than 6 months despite non-surgical (conservative), then surgery may be recommended.   MEDICATION  If pain medication is necessary, then nonsteroidal anti-inflammatory medications, such as aspirin and ibuprofen, or other minor pain relievers, such as acetaminophen, are often recommended. Do not take pain medication within 7 days before surgery. Prescription pain relievers may be given if deemed necessary by your caregiver. Use only as directed and only as much as you need. Corticosteroid injections may be given by your caregiver. These injections should be reserved for the most serious cases, because they may only be given a certain number of times.  HEAT AND COLD Cold treatment (icing) relieves pain and reduces inflammation. Cold treatment should be applied for 10 to 15 minutes every 2 to 3 hours for inflammation and pain and immediately after any activity that aggravates your symptoms. Use ice packs or massage the area with a piece of ice (ice massage). Heat treatment may be used prior to performing the stretching and strengthening activities prescribed by your caregiver, physical therapist, or athletic trainer. Use a heat pack or soak the injury in warm water.  SEEK IMMEDIATE MEDICAL CARE IF: Treatment seems to   offer no benefit, or the condition  worsens. Any medications produce adverse side effects.  EXERCISES- RANGE OF MOTION (ROM) AND STRETCHING EXERCISES - Plantar Fasciitis (Heel Spur Syndrome) These exercises may help you when beginning to rehabilitate your injury. Your symptoms may resolve with or without further involvement from your physician, physical therapist or athletic trainer. While completing these exercises, remember:  Restoring tissue flexibility helps normal motion to return to the joints. This allows healthier, less painful movement and activity. An effective stretch should be held for at least 30 seconds. A stretch should never be painful. You should only feel a gentle lengthening or release in the stretched tissue.  RANGE OF MOTION - Toe Extension, Flexion Sit with your right / left leg crossed over your opposite knee. Grasp your toes and gently pull them back toward the top of your foot. You should feel a stretch on the bottom of your toes and/or foot. Hold this stretch for 10 seconds. Now, gently pull your toes toward the bottom of your foot. You should feel a stretch on the top of your toes and or foot. Hold this stretch for 10 seconds. Repeat  times. Complete this stretch 3 times per day.   RANGE OF MOTION - Ankle Dorsiflexion, Active Assisted Remove shoes and sit on a chair that is preferably not on a carpeted surface. Place right / left foot under knee. Extend your opposite leg for support. Keeping your heel down, slide your right / left foot back toward the chair until you feel a stretch at your ankle or calf. If you do not feel a stretch, slide your bottom forward to the edge of the chair, while still keeping your heel down. Hold this stretch for 10 seconds. Repeat 3 times. Complete this stretch 2 times per day.   STRETCH  Gastroc, Standing Place hands on wall. Extend right / left leg, keeping the front knee somewhat bent. Slightly point your toes inward on your back foot. Keeping your right / left  heel on the floor and your knee straight, shift your weight toward the wall, not allowing your back to arch. You should feel a gentle stretch in the right / left calf. Hold this position for 10 seconds. Repeat 3 times. Complete this stretch 2 times per day.  STRETCH  Soleus, Standing Place hands on wall. Extend right / left leg, keeping the other knee somewhat bent. Slightly point your toes inward on your back foot. Keep your right / left heel on the floor, bend your back knee, and slightly shift your weight over the back leg so that you feel a gentle stretch deep in your back calf. Hold this position for 10 seconds. Repeat 3 times. Complete this stretch 2 times per day.  STRETCH  Gastrocsoleus, Standing  Note: This exercise can place a lot of stress on your foot and ankle. Please complete this exercise only if specifically instructed by your caregiver.  Place the ball of your right / left foot on a step, keeping your other foot firmly on the same step. Hold on to the wall or a rail for balance. Slowly lift your other foot, allowing your body weight to press your heel down over the edge of the step. You should feel a stretch in your right / left calf. Hold this position for 10 seconds. Repeat this exercise with a slight bend in your right / left knee. Repeat 3 times. Complete this stretch 2 times per day.   STRENGTHENING EXERCISES - Plantar Fasciitis (  Heel Spur Syndrome)  These exercises may help you when beginning to rehabilitate your injury. They may resolve your symptoms with or without further involvement from your physician, physical therapist or athletic trainer. While completing these exercises, remember:  Muscles can gain both the endurance and the strength needed for everyday activities through controlled exercises. Complete these exercises as instructed by your physician, physical therapist or athletic trainer. Progress the resistance and repetitions only as guided.  STRENGTH -  Towel Curls Sit in a chair positioned on a non-carpeted surface. Place your foot on a towel, keeping your heel on the floor. Pull the towel toward your heel by only curling your toes. Keep your heel on the floor. Repeat 3 times. Complete this exercise 2 times per day.  STRENGTH - Ankle Inversion Secure one end of a rubber exercise band/tubing to a fixed object (table, pole). Loop the other end around your foot just before your toes. Place your fists between your knees. This will focus your strengthening at your ankle. Slowly, pull your big toe up and in, making sure the band/tubing is positioned to resist the entire motion. Hold this position for 10 seconds. Have your muscles resist the band/tubing as it slowly pulls your foot back to the starting position. Repeat 3 times. Complete this exercises 2 times per day.  Document Released: 07/07/2005 Document Revised: 09/29/2011 Document Reviewed: 10/19/2008 [employer] Patient Information 2014 [employer].  

## 2022-12-28 ENCOUNTER — Encounter: Payer: Self-pay | Admitting: Cardiology

## 2022-12-28 NOTE — Assessment & Plan Note (Signed)
18 years out from his LAD PCI for an abnormal Myoview.  Has not had another Myoview since 2019 that was nonischemic.  He has not had any recurrent anginal symptoms, therefore no need to do any additional studies.  Plan:  On minimal therapy with aspirin 81 mg daily, low-dose Imdur 30 mg daily (BP would not tolerate other meds) No beta-blocker or ARB because of low blood pressure and hypotension issues. He is on rosuvastatin 40 mg along with fenofibrate 145 mg daily for lipids.

## 2022-12-28 NOTE — Assessment & Plan Note (Signed)
He was given a diagnosis of hypertension But he has not had any hypotensive episodes.  We removed his blood pressure medications and he is felt much better.  He was on an ARB before we stopped it.  No further syncope.

## 2022-12-28 NOTE — Assessment & Plan Note (Addendum)
Removed BP meds other than Imdur.  Continue to stressed the importance of maintaining adequate hydration.

## 2022-12-28 NOTE — Assessment & Plan Note (Signed)
Open to avoid diuretic.  Recommended elevation and support stockings

## 2022-12-28 NOTE — Assessment & Plan Note (Signed)
Thankfully, no further episodes.  Allowing permissive hypertension having removed ARB and beta-blocker. Aggressive hydration.

## 2022-12-28 NOTE — Assessment & Plan Note (Signed)
Lipids look pretty well-controlled on combination of fenofibrate and rosuvastatin.  No myalgias issues.  Triglycerides are still little high, but okay.

## 2022-12-29 NOTE — Progress Notes (Signed)
  Subjective:  Patient ID: [name], male    DOB: 04/15/1933,  MRN: [id number]  Chief Complaint  Patient presents with   Foot Pain    np right foot/heel and arch  pain - no specific injury - patient is hard of hearing    89 y.o. male presents with the above complaint. History confirmed with patient.  This was caused by an old pair shoes, he switched to new [employer] and his pain is much improved  Objective:  Physical Exam: warm, good capillary refill, no trophic changes or ulcerative lesions, normal DP and PT pulses, normal sensory exam, and no pain to palpation  Assessment:   1. Plantar fasciitis of right foot      Plan:  Patient was evaluated and treated and all questions answered.  Suspect he likely had plantar fasciitis.  I recommended home therapy exercises and these were given to him.  Continue OTC Tylenol and Voltaren gel as needed.  Return as needed if this does not improve or worsens for corticosteroid injection, we discussed this today and he declined this.  Return if symptoms worsen or fail to improve.

## 2022-12-30 ENCOUNTER — Other Ambulatory Visit: Payer: Self-pay | Admitting: Cardiology

## 2023-01-15 ENCOUNTER — Other Ambulatory Visit: Payer: Self-pay | Admitting: Cardiology

## 2023-02-23 DIAGNOSIS — R351 Nocturia: Secondary | ICD-10-CM | POA: Diagnosis not present

## 2023-02-23 DIAGNOSIS — N401 Enlarged prostate with lower urinary tract symptoms: Secondary | ICD-10-CM | POA: Diagnosis not present

## 2023-03-24 ENCOUNTER — Ambulatory Visit: Payer: Medicare Other | Admitting: Dermatology

## 2023-05-19 DIAGNOSIS — H0102A Squamous blepharitis right eye, upper and lower eyelids: Secondary | ICD-10-CM | POA: Diagnosis not present

## 2023-05-19 DIAGNOSIS — H0102B Squamous blepharitis left eye, upper and lower eyelids: Secondary | ICD-10-CM | POA: Diagnosis not present

## 2023-05-19 DIAGNOSIS — H02132 Senile ectropion of right lower eyelid: Secondary | ICD-10-CM | POA: Diagnosis not present

## 2023-05-19 DIAGNOSIS — Z961 Presence of intraocular lens: Secondary | ICD-10-CM | POA: Diagnosis not present

## 2023-06-23 ENCOUNTER — Ambulatory Visit: Payer: Medicare Other | Admitting: Dermatology

## 2023-08-06 DIAGNOSIS — R3915 Urgency of urination: Secondary | ICD-10-CM | POA: Diagnosis not present

## 2023-08-06 DIAGNOSIS — N471 Phimosis: Secondary | ICD-10-CM | POA: Diagnosis not present

## 2023-08-06 DIAGNOSIS — R350 Frequency of micturition: Secondary | ICD-10-CM | POA: Diagnosis not present

## 2023-08-13 DIAGNOSIS — L570 Actinic keratosis: Secondary | ICD-10-CM | POA: Diagnosis not present

## 2023-08-13 DIAGNOSIS — D1801 Hemangioma of skin and subcutaneous tissue: Secondary | ICD-10-CM | POA: Diagnosis not present

## 2023-08-13 DIAGNOSIS — Z85828 Personal history of other malignant neoplasm of skin: Secondary | ICD-10-CM | POA: Diagnosis not present

## 2023-08-13 DIAGNOSIS — L814 Other melanin hyperpigmentation: Secondary | ICD-10-CM | POA: Diagnosis not present

## 2023-08-17 ENCOUNTER — Telehealth: Payer: Self-pay | Admitting: Cardiology

## 2023-08-17 NOTE — Telephone Encounter (Signed)
   Pre-operative Risk Assessment    Patient Name: Jeremy Deleon  DOB: 10-15-1932 MRN: [id number]   Date of last office visit: 0603/24 Date of next office visit: nothing scheduled yet    Request for Surgical Clearance    Procedure:  Circumcision   Date of Surgery:  Clearance 09/25/23                                Surgeon:  Dr. Vallery Ridge Group or Practice Name:  [facility]  Phone number:  [phone]  Fax number:  [phone]    Type of Clearance Requested:   - Medical  - Pharmacy:  Hold Aspirin     Type of Anesthesia:   choice    Additional requests/questions:    Alben Spittle   08/17/2023, 3:44 PM

## 2023-08-19 NOTE — Telephone Encounter (Signed)
I s/w the pt about tele appt for preop. Pt asked if he could have appt in office instead, pt is extremely HOH. I said that will be fine. Pt has been scheduled to see Bernadene Person, NP 08/31/23 @ 1:55. I will update all parties involved.

## 2023-08-19 NOTE — Telephone Encounter (Signed)
   Name: Jeremy Deleon  DOB: [birth year]  MRN: [id number]  Primary Cardiologist: Bryan Lemma, MD   Preoperative team, please contact this patient and set up a phone call appointment for further preoperative risk assessment. Please obtain consent and complete medication review. Thank you for your help.  I confirm that guidance regarding antiplatelet and oral anticoagulation therapy has been completed and, if necessary, noted below. Aspirin can be held x 7 days  I also confirmed the patient resides in the state of [place]. As per [facility] Medical Board telemedicine laws, the patient must reside in the state in which the provider is licensed. [place]  [place], [place] 08/19/2023, 7:30 AM [facility]

## 2023-08-31 ENCOUNTER — Ambulatory Visit: Payer: Medicare Other | Attending: Nurse Practitioner | Admitting: Nurse Practitioner

## 2023-08-31 ENCOUNTER — Encounter: Payer: Self-pay | Admitting: Nurse Practitioner

## 2023-08-31 VITALS — BP 130/64 | HR 69 | Ht 72.0 in | Wt 207.0 lb

## 2023-08-31 DIAGNOSIS — Z87898 Personal history of other specified conditions: Secondary | ICD-10-CM | POA: Diagnosis not present

## 2023-08-31 DIAGNOSIS — I10 Essential (primary) hypertension: Secondary | ICD-10-CM | POA: Diagnosis not present

## 2023-08-31 DIAGNOSIS — E785 Hyperlipidemia, unspecified: Secondary | ICD-10-CM | POA: Diagnosis not present

## 2023-08-31 DIAGNOSIS — I2510 Atherosclerotic heart disease of native coronary artery without angina pectoris: Secondary | ICD-10-CM | POA: Diagnosis not present

## 2023-08-31 DIAGNOSIS — Z01810 Encounter for preprocedural cardiovascular examination: Secondary | ICD-10-CM

## 2023-08-31 NOTE — Patient Instructions (Signed)
 Medication Instructions:  Your physician recommends that you continue on your current medications as directed. Please refer to the Current Medication list given to you today.  *If you need a refill on your cardiac medications before your next appointment, please call your pharmacy*   Lab Work: NONE ordered at this time of appointment   Testing/Procedures: NONE ordered at this time of appointment   Follow-Up: At [facility], you and your health needs are our priority.  As part of our continuing mission to provide you with exceptional heart care, we have created designated Provider Care Teams.  These Care Teams include your primary Cardiologist (physician) and Advanced Practice Providers (APPs -  Physician Assistants and Nurse Practitioners) who all work together to provide you with the care you need, when you need it.  We recommend signing up for the patient portal called "MyChart".  Sign up information is provided on this After Visit Summary.  MyChart is used to connect with patients for Virtual Visits (Telemedicine).  Patients are able to view lab/test results, encounter notes, upcoming appointments, etc.  Non-urgent messages can be sent to your provider as well.   To learn more about what you can do with MyChart, go to [email or web address].    Your next appointment:   6 month(s)  Provider:   Randene Bustard, MD     Other Instructions Hold Asprin prior to your surgery.

## 2023-08-31 NOTE — Progress Notes (Signed)
 Office Visit    Patient Name: Jeremy Deleon Date of Encounter: 08/31/2023  Primary Care Provider:  Lorenso Romance, MD Primary Cardiologist:  Randene Bustard, MD  Chief Complaint    89 year old male with a history of CAD s/p DES-LAD in 2006, prior syncope, chronic bilateral lower extremity edema, hypertension, orthostatic hypotension, hyperlipidemia, and squamous cell lung cancer s/p LLL lobectomy and mediastinal lymph node dissection in 2016 who presents for follow-up related to CAD and for preoperative cardiac evaluation.  Past Medical History    Past Medical History:  Diagnosis Date   Basal cell carcinoma    BPH (benign prostatic hyperplasia)    CAD S/P percutaneous coronary angioplasty 07/25/2004   Cardiologist-- Dr Addie Holstein - [place] '06Abnormal Myoview  with inferolateral ischemia: --> per cardiac cath 09-16-2004 -- 80% prox LAD lesion PCI: Taxus DES 3.0 mm x 16 mm-;; Myoview  June 2009 no ischemia or infarction with attenuation artifact   Chronic constipation    Dyslipidemia, goal LDL below 70    on simvastatin  and tricor    Emphysema of lung (HCC)    Full dentures    GERD (gastroesophageal reflux disease)    Graves' disease 11/ 2016 dx   endocrinologist-  dr Gwyndolyn Lerner-- treated with tapazole  until 2018 when labs were normal   History of basal cell carcinoma (BCC) excision    Hypertension    Left hydrocele    Mild atherosclerosis of both carotid arteries    duplex 09-09-2016  1-39% bilateral ICA   Osteoarthritis    S/P drug eluting coronary stent placement 09/16/2004   x1  to prox. LAD   Squamous cell lung cancer : Left lower lobe 07/2014   - oncologist-  dr Marguerita Shih---  per lov note in epic no recurrence: s/p  Bronchoscopy showed extrinsic compression of the left lower lobe with endobronchial biopsies were negative. He subsequently had a needle biopsy which dx the mass as squamous cell carcinoma /  08-10-2014  s/p  Left VATS w/ LLlobectomy with node dissections and  completed chemotherapy (11/2014)   Wears glasses    Wears hearing aid in both ears    Past Surgical History:  Procedure Laterality Date   APPENDECTOMY  child   CATARACT EXTRACTION W/ INTRAOCULAR LENS  IMPLANT, BILATERAL  2016 & 2017   HYDROCELE EXCISION Left 03/12/2018   Procedure: HYDROCELECTOMY ADULT;  Surgeon: Andrez Banker, MD;  Location: [facility];  Service: Urology;  Laterality: Left;   NM MYOVIEW  LTD  January 06, 2008    treadmill  myoview  - tissue attenuation but no ischemia or infarction   PERCUTANEOUS CORONARY STENT INTERVENTION (PCI-S)  2/27/ 2006    dr Silvestre Drum  @[facility]    (07-25-2004 positive cardiolite for ischemia)  balloon angioplasty and TAXUS DES 3.0 mm x 16 mm stent ->  PROX  LAD (80%)   TRANSTHORACIC ECHOCARDIOGRAM  09/16/2016   Limited echo with bubble study: EF 60- 65%, GR 1 DD./ Negative bubble study/  mild AR/  mild dilated ascending aorta/ No regional wall motion abnormality   UMBILICAL HERNIA REPAIR  11-23-2007    dr Toniann Franklin   @[facility]    VIDEO ASSISTED THORACOSCOPY (VATS)/ LOBECTOMY Left 08/10/2014   Procedure: VIDEO ASSISTED THORACOSCOPY (VATS)/ LOBECTOMY;  Surgeon: Heriberto London, MD;  Location: [facility] OR;  Service: Thoracic;  Laterality: Left;   VIDEO BRONCHOSCOPY N/A 08/10/2014   Procedure: VIDEO BRONCHOSCOPY;  Surgeon: Heriberto London, MD;  Location: [facility] OR;  Service: Thoracic;  Laterality: N/A;    Allergies  Allergies  Allergen Reactions   Doxycycline  Rash   Niaspan [Niacin Er (Antihyperlipidemic)] Rash and Other (See Comments)    flushing     Labs/Other Studies Reviewed    The following studies were reviewed today:  Cardiac Studies & Procedures     STRESS TESTS  NM MYOCAR MULTI W/SPECT W 01/06/2008  ECHOCARDIOGRAM  ECHOCARDIOGRAM LIMITED BUBBLE STUDY 09/16/2016  Narrative *[facility]* [address] [place], [place]  [address] [phone]  ------------------------------------------------------------------- Echocardiography  Patient:    Jeremy Deleon, Jeremy Deleon MR #:       [id number] Study Date: 09/16/2016 Gender:     M Age:        83 Height:     185.4 cm Weight:     99.2 kg BSA:        2.28 m^2 Pt. Status: Room:  SONOGRAPHER  Glean Lamy, RDCS ATTENDING    Randene Bustard, MD ORDERING     Randene Bustard, MD REFERRING    Randene Bustard, MD PERFORMING   [facility], Outpatient  cc:  ------------------------------------------------------------------- LV EF: 60% -   65%  ------------------------------------------------------------------- Indications:     I70.8, H35.09 Retinal Artery Plaque.  LIMITED ECHO WITH BUBBLE STUDY.  ------------------------------------------------------------------- History:   PMH:  Acquired from the patient and from the patient&'s chart.  PMH:  CAD. GERD. Squamous Cell Lung Cancer.  Risk factors: Hypertension. Dyslipidemia.  ------------------------------------------------------------------- Study Conclusions  - Left ventricle: The cavity size was normal. There was mild concentric hypertrophy. Systolic function was normal. The estimated ejection fraction was in the range of 60% to 65%. Wall motion was normal; there were no regional wall motion abnormalities. Doppler parameters are consistent with abnormal left ventricular relaxation (grade 1 diastolic dysfunction). Doppler parameters are consistent with indeterminate ventricular filling pressure. - Aortic valve: Transvalvular velocity was within the normal range. There was no stenosis. There was mild regurgitation. - Mitral valve: Transvalvular velocity was within the normal range. There was no evidence for stenosis. There was no regurgitation. - Right ventricle: The cavity size was normal. Wall thickness was normal. Systolic function was normal. - Atrial septum: No defect or patent foramen ovale was identified by saline  microcavitation study. - Tricuspid valve: There was no regurgitation.  ------------------------------------------------------------------- Study data:   Study status:  Routine.  Procedure:  The patient reported no pain pre or post test. Transthoracic echocardiography. Image quality was adequate. Intravenous contrast (agitated saline) was administered to identify shunting.          Echocardiography. M-mode, limited 2D, limited spectral Doppler, and color Doppler. Birthdate:  Patient birthdate: 06/24/1933.  Age:  Patient is 89 yr old.  Sex:  Gender: male.    BMI: 28.8 kg/m^2.  Blood pressure: 101/58  Patient status:  Outpatient.  Study date:  Study date: 09/16/2016. Study time: 09:04 AM.  Location:  [facility]  -------------------------------------------------------------------  ------------------------------------------------------------------- Left ventricle:  The cavity size was normal. There was mild concentric hypertrophy. Systolic function was normal. The estimated ejection fraction was in the range of 60% to 65%. Wall motion was normal; there were no regional wall motion abnormalities. Doppler parameters are consistent with abnormal left ventricular relaxation (grade 1 diastolic dysfunction). Doppler parameters are consistent with indeterminate ventricular filling pressure.  ------------------------------------------------------------------- Aortic valve:   Trileaflet; normal thickness leaflets. Mobility was not restricted.  Doppler:  Transvalvular velocity was within the normal range. There was no stenosis. There was mild regurgitation.  ------------------------------------------------------------------- Aorta:  Aortic root: The aortic root was normal in size. Ascending  aorta: The ascending aorta was mildly dilated.  ------------------------------------------------------------------- Mitral valve:   Structurally normal valve.   Mobility was not restricted.  Doppler:   Transvalvular velocity was within the normal range. There was no evidence for stenosis. There was no regurgitation.  ------------------------------------------------------------------- Left atrium:  The atrium was normal in size.  ------------------------------------------------------------------- Atrial septum:  No defect or patent foramen ovale was identified by saline microcavitation study.  ------------------------------------------------------------------- Right ventricle:  The cavity size was normal. Wall thickness was normal. Systolic function was normal.  ------------------------------------------------------------------- Pulmonic valve:    Doppler:  Transvalvular velocity was within the normal range. There was no evidence for stenosis.  ------------------------------------------------------------------- Tricuspid valve:   Structurally normal valve.    Doppler: Transvalvular velocity was within the normal range. There was no regurgitation.  ------------------------------------------------------------------- Pulmonary artery:   The main pulmonary artery was normal-sized. Systolic pressure could not be accurately estimated.  ------------------------------------------------------------------- Right atrium:  The atrium was normal in size.  ------------------------------------------------------------------- Pericardium:  There was no pericardial effusion.  ------------------------------------------------------------------- Systemic veins: Inferior vena cava: The vessel was normal in size. The respirophasic diameter changes were in the normal range (>= 50%), consistent with normal central venous pressure.  ------------------------------------------------------------------- Measurements  Left ventricle                         Value        Reference LV ID, ED, PLAX chordal        (L)     38.7  mm     43 - 52 LV ID, ES, PLAX chordal                25.7  mm     23 - 38 LV  fx shortening, PLAX chordal         34    %      >=29 LV PW thickness, ED                    10.6  mm     --------- IVS/LV PW ratio, ED                    1.02         <=1.3 LV e&', lateral                         6.69  cm/s   --------- LV E/e&', lateral                       9.88         --------- LV e&', medial                          6.03  cm/s   --------- LV E/e&', medial                        10.96        --------- LV e&', average                         6.36  cm/s   --------- LV E/e&', average                       10.39        ---------  Ventricular septum  Value        Reference IVS thickness, ED                      10.8  mm     ---------  Aorta                                  Value        Reference Aortic root ID, ED                     39    mm     --------- Ascending aorta ID, A-P, S             39    mm     ---------  Left atrium                            Value        Reference LA ID, A-P, ES                         42    mm     --------- LA ID/bsa, A-P                         1.84  cm/m^2 <=2.2  Mitral valve                           Value        Reference Mitral E-wave peak velocity            66.1  cm/s   --------- Mitral A-wave peak velocity            86.9  cm/s   --------- Mitral deceleration time       (H)     313   ms     150 - 230 Mitral E/A ratio, peak                 0.8          ---------  Systemic veins                         Value        Reference Estimated CVP                          3     mm Hg  ---------  Right ventricle                        Value        Reference RV s&', lateral, S                      12.5  cm/s   ---------  Legend: (L)  and  (H)  mark values outside specified reference range.  ------------------------------------------------------------------- Prepared and Electronically Authenticated by  Maudine Sos, MD 2018-02-27T11:55:59            Recent Labs: 11/19/2022: ALT 10; BUN 18; Creatinine 1.09;  Hemoglobin 15.7; Platelet Count 186; Potassium 3.7; Sodium 141  Recent Lipid Panel    Component Value Date/Time   CHOL 120 11/22/2019 0842  TRIG 90 11/22/2019 0842   HDL 39 (L) 11/22/2019 0842   CHOLHDL 3.1 11/22/2019 0842   CHOLHDL 3.3 09/08/2016 0938   VLDL 15 09/08/2016 0938   LDLCALC 64 11/22/2019 0842    History of Present Illness    89 year old male with the above past medical history including CAD s/p DES-LAD in 2006, prior syncope, chronic bilateral lower extremity edema, hypertension, orthostatic hypotension, hyperlipidemia, and squamous cell lung cancer s/p LLL lobectomy and mediastinal lymph node dissection in 2016.  He has a history of CAD s/p DES-LAD in 2006 in the setting of abnormal Myoview .  Myoview  in 2009 was nonischemic.  Echocardiogram in 2018 showed EF 60 to 65%, G1 DD, no significant valvular abnormalities.  He has a history of orthostatic hypotension and prior syncope.  He was last seen in the office on 12/22/2022 was stable from a cardiac standpoint.  He denied symptoms concerning for angina.   He presents today for follow-up and for preoperative cardiac evaluation for upcoming circumcision on 09/25/2023 with Dr. Dulcy Gibney of [facility].  Since his last visit he has done well from a cardiac standpoint. He remains active, ambulates with a cane. He denies symptoms concerning for angina. Overall, he reports feeling well.   Home Medications    Current Outpatient Medications  Medication Sig Dispense Refill   aspirin  EC 81 MG tablet Take 81 mg by mouth daily.     isosorbide  mononitrate (IMDUR ) 30 MG 24 hr tablet Take 1 tablet (30 mg total) by mouth daily. TAKE 1 TABLET(30 MG) BY MOUTH DAILY. 90 tablet 3   rosuvastatin  (CRESTOR ) 40 MG tablet Take 1 tablet (40 mg total) by mouth daily. 90 tablet 3   clobetasol  cream (TEMOVATE ) 0.05 % Apply 1 application topically 2 (two) times daily. (Patient not taking: Reported on 08/31/2023) 30 g 0   fenofibrate  (TRICOR ) 145 MG tablet  TAKE 1 TABLET(145 MG) BY MOUTH DAILY (Patient not taking: Reported on 08/31/2023) 90 tablet 3   levothyroxine (SYNTHROID, LEVOTHROID) 50 MCG tablet Take 1 tablet by mouth daily. (Patient not taking: Reported on 08/31/2023)     No current facility-administered medications for this visit.     Review of Systems    He denies chest pain, palpitations, dyspnea, pnd, orthopnea, n, v, dizziness, syncope, edema, weight gain, or early satiety. All other systems reviewed and are otherwise negative except as noted above.   Physical Exam    VS:  BP 130/64   Pulse 69   Ht 6' (1.829 m)   Wt 207 lb (93.9 kg)   SpO2 97%   BMI 28.07 kg/m  GEN: Well nourished, well developed, in no acute distress. HEENT: normal. Neck: Supple, no JVD, carotid bruits, or masses. Cardiac: RRR, no murmurs, rubs, or gallops. No clubbing, cyanosis, edema.  Radials/DP/PT 2+ and equal bilaterally.  Respiratory:  Respirations regular and unlabored, clear to auscultation bilaterally. GI: Soft, nontender, nondistended, BS + x 4. MS: no deformity or atrophy. Skin: warm and dry, no rash. Neuro:  Strength and sensation are intact. Psych: Normal affect.  Accessory Clinical Findings    ECG personally reviewed by me today - EKG Interpretation Date/Time:  Monday August 31 2023 14:06:14 EST Ventricular Rate:  64 PR Interval:  230 QRS Duration:  96 QT Interval:  428 QTC Calculation: 441 R Axis:   -5  Text Interpretation: Sinus rhythm with 1st degree A-V block When compared with ECG of 16-Sep-2022 23:59, PREVIOUS ECG IS PRESENT Confirmed by Marlana Silvan ([id number]) on 08/31/2023 2:09:03 PM  -  no acute changes.   Lab Results  Component Value Date   WBC 6.2 11/19/2022   HGB 15.7 11/19/2022   HCT 46.0 11/19/2022   MCV 93.3 11/19/2022   PLT 186 11/19/2022   Lab Results  Component Value Date   CREATININE 1.09 11/19/2022   BUN 18 11/19/2022   NA 141 11/19/2022   K 3.7 11/19/2022   CL 107 11/19/2022   CO2 30 11/19/2022   Lab  Results  Component Value Date   ALT 10 11/19/2022   AST 24 11/19/2022   ALKPHOS 71 11/19/2022   BILITOT 0.6 11/19/2022   Lab Results  Component Value Date   CHOL 120 11/22/2019   HDL 39 (L) 11/22/2019   LDLCALC 64 11/22/2019   TRIG 90 11/22/2019   CHOLHDL 3.1 11/22/2019    No results found for: "HGBA1C"  Assessment & Plan    1. CAD: S/p DES-LAD in 2006 in the setting of abnormal Myoview .  Myoview  in 2009 was nonischemic.  Echocardiogram in 2018 showed EF 60 to 65%, G1 DD, no significant valvular abnormalities. Stable with no anginal symptoms. No indication for ischemic evaluation. Continue Aspirin , Imdur , Crestor  and fenofibrate .   2. History of syncope: No recurrence.   3. Hypertension: BP well controlled. Continue current antihypertensive regimen.   4. Hyperlipidemia: LDL was 60 in 08/2022. He will have repeat labs drawn with his PCP in the coming months. Continue Crestor .   5. Preoperative cardiac exam: According to the Revised Cardiac Risk Index (RCRI), his Perioperative Risk of Major Cardiac Event is (%): 0.9. His Functional Capacity in METs is: 5.07 according to the [employer] Activity Status Index (DASI). Therefore, based on ACC/AHA guidelines, patient would be at acceptable risk for the planned procedure without further cardiovascular testing. Regarding ASA therapy, we recommend continuation of ASA throughout the perioperative period.  However, if the surgeon feels that cessation of ASA is required in the perioperative period, it may be stopped 5-7 days prior to surgery with a plan to resume it as soon as felt to be feasible from a surgical standpoint in the post-operative period. I will route this recommendation to the requesting party via Epic fax function.  6. Disposition: Follow-up in 6 months.        Jude Norton, NP 08/31/2023, 2:43 PM

## 2023-11-10 ENCOUNTER — Ambulatory Visit (HOSPITAL_COMMUNITY)
Admission: RE | Admit: 2023-11-10 | Discharge: 2023-11-10 | Disposition: A | Discharge status: Home / Self Care | Source: Ambulatory Visit | Attending: Internal Medicine | Admitting: Internal Medicine

## 2023-11-10 DIAGNOSIS — C3490 Malignant neoplasm of unspecified part of unspecified bronchus or lung: Secondary | ICD-10-CM | POA: Insufficient documentation

## 2023-11-12 ENCOUNTER — Telehealth: Payer: Self-pay | Admitting: Physician Assistant

## 2023-11-12 NOTE — Telephone Encounter (Signed)
 Patient called with confusion on appointments. Made the patient aware of the appointment scheduled.

## 2023-11-15 NOTE — Progress Notes (Unsigned)
 [facility] OFFICE PROGRESS NOTE  [name], Rock Chum, MD [address] [place] [address]  DIAGNOSIS: Stage IIA (T2b, N0, M0) non-small cell lung cancer consistent with squamous cell carcinoma diagnosed in January 2016   PRIOR THERAPY: 1) Status post left VATS with left lower lobectomy and mediastinal lymph node dissection under the care of Dr. Matt Song on 08/10/2014. 2) Adjuvant systemic chemotherapy with carboplatin  for AUC of 5 on day 1 and gemcitabine  1000 MG/M2 on days 1 and 8 every 3 weeks. First dose 09/19/2014.  CURRENT THERAPY: Observation   INTERVAL HISTORY: Jeremy Deleon 89 y.o. male returns to the clinic today for a follow-up visit. The patient is feeling fine today with no concerning complaints. He was last seen in the clinic 1 year ago. He is followed for his history of lung cancer, which was diagnosed in 2016. He denies fevers, chills, or night sweats.  He denied having any current chest pain but has shortness of breath with exertion with no cough or hemoptysis. He has no nausea, vomiting, or diarrhea. He does struggle with constipation. He has no headache or visual changes. He recently had a restaging CT scan performed. He is here for evaluation and repeat blood work.   MEDICAL HISTORY: Past Medical History:  Diagnosis Date   Basal cell carcinoma    BPH (benign prostatic hyperplasia)    CAD S/P percutaneous coronary angioplasty 07/25/2004   Cardiologist-- Dr Addie Holstein - [place] '06Abnormal Myoview  with inferolateral ischemia: --> per cardiac cath 09-16-2004 -- 80% prox LAD lesion PCI: Taxus DES 3.0 mm x 16 mm-;; Myoview  June 2009 no ischemia or infarction with attenuation artifact   Chronic constipation    Dyslipidemia, goal LDL below 70    on simvastatin  and tricor    Emphysema of lung (HCC)    Full dentures    GERD (gastroesophageal reflux disease)    Graves' disease 11/ 2016 dx   endocrinologist-  dr Gwyndolyn Lerner-- treated with tapazole   until 2018 when labs were normal   History of basal cell carcinoma (BCC) excision    Hypertension    Left hydrocele    Mild atherosclerosis of both carotid arteries    duplex 09-09-2016  1-39% bilateral ICA   Osteoarthritis    S/P drug eluting coronary stent placement 09/16/2004   x1  to prox. LAD   Squamous cell lung cancer : Left lower lobe 07/2014   - oncologist-  dr Marguerita Shih---  per lov note in epic no recurrence: s/p  Bronchoscopy showed extrinsic compression of the left lower lobe with endobronchial biopsies were negative. He subsequently had a needle biopsy which dx the mass as squamous cell carcinoma /  08-10-2014  s/p  Left VATS w/ LLlobectomy with node dissections and completed chemotherapy (11/2014)   Wears glasses    Wears hearing aid in both ears     ALLERGIES:  is allergic to doxycycline  and niaspan [niacin er (antihyperlipidemic)].  MEDICATIONS:  Current Outpatient Medications  Medication Sig Dispense Refill   aspirin  EC 81 MG tablet Take 81 mg by mouth daily.     clobetasol  cream (TEMOVATE ) 0.05 % Apply 1 application topically 2 (two) times daily. (Patient not taking: Reported on 08/31/2023) 30 g 0   fenofibrate  (TRICOR ) 145 MG tablet TAKE 1 TABLET(145 MG) BY MOUTH DAILY (Patient not taking: Reported on 08/31/2023) 90 tablet 3   isosorbide  mononitrate (IMDUR ) 30 MG 24 hr tablet Take 1 tablet (30 mg total) by mouth daily. TAKE 1 TABLET(30  MG) BY MOUTH DAILY. 90 tablet 3   levothyroxine (SYNTHROID, LEVOTHROID) 50 MCG tablet Take 1 tablet by mouth daily. (Patient not taking: Reported on 08/31/2023)     rosuvastatin  (CRESTOR ) 40 MG tablet Take 1 tablet (40 mg total) by mouth daily. 90 tablet 3   No current facility-administered medications for this visit.    SURGICAL HISTORY:  Past Surgical History:  Procedure Laterality Date   APPENDECTOMY  child   CATARACT EXTRACTION W/ INTRAOCULAR LENS  IMPLANT, BILATERAL  2016 & 2017   HYDROCELE EXCISION Left 03/12/2018   Procedure:  HYDROCELECTOMY ADULT;  Surgeon: Andrez Banker, MD;  Location: [facility];  Service: Urology;  Laterality: Left;   NM MYOVIEW  LTD  January 06, 2008    treadmill  myoview  - tissue attenuation but no ischemia or infarction   PERCUTANEOUS CORONARY STENT INTERVENTION (PCI-S)  2/27/ 2006    dr Silvestre Drum  @[facility]    (07-25-2004 positive cardiolite for ischemia)  balloon angioplasty and TAXUS DES 3.0 mm x 16 mm stent ->  PROX  LAD (80%)   TRANSTHORACIC ECHOCARDIOGRAM  09/16/2016   Limited echo with bubble study: EF 60- 65%, GR 1 DD./ Negative bubble study/  mild AR/  mild dilated ascending aorta/ No regional wall motion abnormality   UMBILICAL HERNIA REPAIR  11-23-2007    dr Toniann Franklin   @[facility]    VIDEO ASSISTED THORACOSCOPY (VATS)/ LOBECTOMY Left 08/10/2014   Procedure: VIDEO ASSISTED THORACOSCOPY (VATS)/ LOBECTOMY;  Surgeon: Heriberto London, MD;  Location: [facility] OR;  Service: Thoracic;  Laterality: Left;   VIDEO BRONCHOSCOPY N/A 08/10/2014   Procedure: VIDEO BRONCHOSCOPY;  Surgeon: Heriberto London, MD;  Location: [facility] OR;  Service: Thoracic;  Laterality: N/A;    REVIEW OF SYSTEMS:   Review of Systems  Constitutional: Negative for appetite change, chills, fatigue, fever and unexpected weight change.  HENT: Negative for mouth sores, nosebleeds, sore throat and trouble swallowing.   Eyes: Negative for eye problems and icterus.  Respiratory: Positive for dyspnea on exertion. Negative for cough, hemoptysis, and wheezing.   Cardiovascular: Negative for chest pain. Positive for ankle swelling bilaterally. Gastrointestinal: Negative for abdominal pain, constipation, diarrhea, nausea and vomiting.  Genitourinary: Negative for bladder incontinence, difficulty urinating, dysuria, frequency and hematuria.   Musculoskeletal: Negative for back pain, gait problem, neck pain and neck stiffness.  Skin: Negative for itching and rash.  Neurological: Negative for dizziness, extremity weakness, gait problem,  headaches, light-headedness and seizures.  Hematological: Negative for adenopathy. Does not bruise/bleed easily.  Psychiatric/Behavioral: Negative for confusion, depression and sleep disturbance. The patient is not nervous/anxious.     PHYSICAL EXAMINATION:  There were no vitals taken for this visit.  ECOG PERFORMANCE STATUS: 2  Physical Exam  Constitutional: Oriented to person, place, and time and well-developed, well-nourished, and in no distress. HENT:  Head: Normocephalic and atraumatic. Hard of hearting Mouth/Throat: Oropharynx is clear and moist. No oropharyngeal exudate.  Eyes: Conjunctivae are normal. Right eye exhibits no discharge. Left eye exhibits no discharge. No scleral icterus.  Neck: Normal range of motion. Neck supple.  Cardiovascular: Normal rate, regular rhythm, normal heart sounds and intact distal pulses.   Pulmonary/Chest: Effort normal and breath sounds normal. No respiratory distress. No wheezes. No rales.  Abdominal: Soft. Bowel sounds are normal. Exhibits no distension and no mass. There is no tenderness.  Musculoskeletal: Normal range of motion. Mild bilateral ankle swelling.  Lymphadenopathy:    No cervical adenopathy.  Neurological: Alert and oriented to person, place, and time.  Exhibits muscle wasting. Uses a cane for ambulation.  Skin: Skin is warm and dry. No rash noted. Not diaphoretic. No erythema. No pallor.  Psychiatric: Mood, memory and judgment normal.  Vitals reviewed.  LABORATORY DATA: Lab Results  Component Value Date   WBC 6.2 11/19/2022   HGB 15.7 11/19/2022   HCT 46.0 11/19/2022   MCV 93.3 11/19/2022   PLT 186 11/19/2022      Chemistry      Component Value Date/Time   NA 141 11/19/2022 0911   NA 140 11/22/2019 0842   NA 141 03/24/2017 1018   K 3.7 11/19/2022 0911   K 4.2 03/24/2017 1018   CL 107 11/19/2022 0911   CO2 30 11/19/2022 0911   CO2 25 03/24/2017 1018   BUN 18 11/19/2022 0911   BUN 21 11/22/2019 0842   BUN 16.5  03/24/2017 1018   CREATININE 1.09 11/19/2022 0911   CREATININE 1.2 03/24/2017 1018      Component Value Date/Time   CALCIUM  9.0 11/19/2022 0911   CALCIUM  9.6 03/24/2017 1018   ALKPHOS 71 11/19/2022 0911   ALKPHOS 87 03/24/2017 1018   AST 24 11/19/2022 0911   AST 30 03/24/2017 1018   ALT 10 11/19/2022 0911   ALT 17 03/24/2017 1018   BILITOT 0.6 11/19/2022 0911   BILITOT 0.56 03/24/2017 1018       RADIOGRAPHIC STUDIES:  No results found.   ASSESSMENT/PLAN:  This is a very pleasant 89 year old Caucasian male with history of stage IIA non-small cell lung cancer, squamous cell carcinoma status post left lower lobectomy with lymph node dissection followed by adjuvant systemic chemotherapy with carboplatin  and gemcitabine  for 4 cycles. The patient has been observation since June 2016.  The patient recently had a restaging Ct scan performed.   The patient was seen with Dr. Marguerita Shih today.  Dr. Marguerita Shih personally and independently reviewed the scan and discussed results with the patient today.  The scan showed no evidence of residual or recurrent disease within the thorax.  Dr. Marguerita Shih recommends he continue on observation with a repeat CT in 1 year.   Dr. Marguerita Shih recommends he continue on observation with a restaging CT scan in 1 year. We will see him about 1 week after the CT to review the results.   I encouraged him to take stool softener for his chronic constipation.   The patient was advised to call immediately if he has any concerning symptoms in the interval. The patient voices understanding of current disease status and treatment options and is in agreement with the current care plan. All questions were answered. The patient knows to call the clinic with any problems, questions or concerns. We can certainly see the patient much sooner if necessary  No orders of the defined types were placed in this encounter.     Argil Mahl L Chavy Avera,  PA-C 11/15/23  ADDENDUM: Hematology/Oncology Attending: I had a face-to-face encounter with the patient.  I reviewed his record, lab, scan and recommended his care plan.  This is a very pleasant 89 years old white male with history of stage IIa non-small cell lung cancer status post left lower lobectomy with mediastinal lymph node dissection followed by adjuvant systemic chemotherapy with carboplatin  and gemcitabine  completed in June 2016 and the patient has been in observation since that time.  He is feeling fine with no concerning complaints except for the hearing deficit and generalized weakness. He had repeat CT scan of the chest performed recently.  I personally and  independently reviewed the scan and discussed the result with the patient today.  His scan showed no concerning findings for disease recurrence or metastasis. I recommended for the patient to continue on observation and give him the option of follow-up with his primary care provider versus coming back for follow-up visit in 1 year.  The patient is interested in coming to the cancer center for evaluation. He was advised to call immediately if he has any other concerning symptoms in the interval. The total time spent in the appointment was 15 minutes. Disclaimer: This note was dictated with voice recognition software. Similar sounding words can inadvertently be transcribed and may be missed upon review. Aurelio Blower, MD

## 2023-11-17 ENCOUNTER — Other Ambulatory Visit: Payer: Medicare Other

## 2023-11-19 ENCOUNTER — Inpatient Hospital Stay: Payer: Medicare Other | Attending: Internal Medicine | Admitting: Physician Assistant

## 2023-11-19 VITALS — BP 123/63 | HR 75 | Temp 98.2°F | Resp 15 | Wt 202.4 lb

## 2023-11-19 DIAGNOSIS — C3490 Malignant neoplasm of unspecified part of unspecified bronchus or lung: Secondary | ICD-10-CM

## 2023-11-19 DIAGNOSIS — C3492 Malignant neoplasm of unspecified part of left bronchus or lung: Secondary | ICD-10-CM

## 2023-11-19 DIAGNOSIS — Z9221 Personal history of antineoplastic chemotherapy: Secondary | ICD-10-CM | POA: Diagnosis not present

## 2023-11-19 DIAGNOSIS — Z85118 Personal history of other malignant neoplasm of bronchus and lung: Secondary | ICD-10-CM | POA: Insufficient documentation

## 2023-11-19 DIAGNOSIS — Z902 Acquired absence of lung [part of]: Secondary | ICD-10-CM | POA: Diagnosis not present

## 2023-12-30 ENCOUNTER — Other Ambulatory Visit: Payer: Self-pay | Admitting: Cardiology

## 2024-01-17 ENCOUNTER — Other Ambulatory Visit: Payer: Self-pay | Admitting: Cardiology

## 2024-08-10 ENCOUNTER — Emergency Department (HOSPITAL_BASED_OUTPATIENT_CLINIC_OR_DEPARTMENT_OTHER)

## 2024-08-10 ENCOUNTER — Encounter (HOSPITAL_BASED_OUTPATIENT_CLINIC_OR_DEPARTMENT_OTHER): Payer: Self-pay | Admitting: Emergency Medicine

## 2024-08-10 ENCOUNTER — Other Ambulatory Visit: Payer: Self-pay

## 2024-08-10 ENCOUNTER — Emergency Department (HOSPITAL_BASED_OUTPATIENT_CLINIC_OR_DEPARTMENT_OTHER)
Admission: EM | Admit: 2024-08-10 | Discharge: 2024-08-10 | Disposition: A | Discharge status: Home / Self Care | Attending: Emergency Medicine | Admitting: Emergency Medicine

## 2024-08-10 DIAGNOSIS — S0990XA Unspecified injury of head, initial encounter: Secondary | ICD-10-CM | POA: Diagnosis not present

## 2024-08-10 DIAGNOSIS — I6789 Other cerebrovascular disease: Secondary | ICD-10-CM | POA: Insufficient documentation

## 2024-08-10 DIAGNOSIS — S8002XA Contusion of left knee, initial encounter: Secondary | ICD-10-CM | POA: Diagnosis not present

## 2024-08-10 DIAGNOSIS — I10 Essential (primary) hypertension: Secondary | ICD-10-CM | POA: Insufficient documentation

## 2024-08-10 DIAGNOSIS — S8992XA Unspecified injury of left lower leg, initial encounter: Secondary | ICD-10-CM | POA: Diagnosis present

## 2024-08-10 DIAGNOSIS — W19XXXA Unspecified fall, initial encounter: Secondary | ICD-10-CM | POA: Insufficient documentation

## 2024-08-10 DIAGNOSIS — Z85828 Personal history of other malignant neoplasm of skin: Secondary | ICD-10-CM | POA: Insufficient documentation

## 2024-08-10 LAB — CBC WITH DIFFERENTIAL/PLATELET
Abs Immature Granulocytes: 0 K/uL (ref 0.00–0.07)
Band Neutrophils: 0 %
Basophils Absolute: 0 K/uL (ref 0.0–0.1)
Basophils Relative: 0 %
Eosinophils Absolute: 0.6 K/uL — ABNORMAL HIGH (ref 0.0–0.5)
Eosinophils Relative: 8 %
HCT: 42.9 % (ref 39.0–52.0)
Hemoglobin: 14.2 g/dL (ref 13.0–17.0)
Immature Granulocytes: 0 %
Lymphocytes Relative: 28 %
Lymphs Abs: 2 K/uL (ref 0.7–4.0)
MCH: 31.1 pg (ref 26.0–34.0)
MCHC: 33.1 g/dL (ref 30.0–36.0)
MCV: 94.1 fL (ref 80.0–100.0)
Monocytes Absolute: 0.7 K/uL (ref 0.1–1.0)
Monocytes Relative: 9 %
Neutro Abs: 4.1 K/uL (ref 1.7–7.7)
Neutrophils Relative %: 55 %
Platelets: 159 K/uL (ref 150–400)
RBC: 4.56 MIL/uL (ref 4.22–5.81)
RDW: 14.9 % (ref 11.5–15.5)
WBC: 7.4 K/uL (ref 4.0–10.5)
nRBC: 0 % (ref 0.0–0.2)
nRBC: 0 /100{WBCs}

## 2024-08-10 LAB — URINALYSIS, W/ REFLEX TO CULTURE (INFECTION SUSPECTED)
Bilirubin Urine: NEGATIVE
Glucose, UA: NEGATIVE mg/dL
Hgb urine dipstick: NEGATIVE
Ketones, ur: NEGATIVE mg/dL
Nitrite: NEGATIVE
Protein, ur: NEGATIVE mg/dL
RBC / HPF: NONE SEEN RBC/hpf (ref 0–5)
Specific Gravity, Urine: 1.025 (ref 1.005–1.030)
pH: 5.5 (ref 5.0–8.0)

## 2024-08-10 LAB — BASIC METABOLIC PANEL WITH GFR
Anion gap: 11 (ref 5–15)
BUN: 24 mg/dL — ABNORMAL HIGH (ref 8–23)
CO2: 27 mmol/L (ref 22–32)
Calcium: 9.1 mg/dL (ref 8.9–10.3)
Chloride: 105 mmol/L (ref 98–111)
Creatinine, Ser: 0.94 mg/dL (ref 0.61–1.24)
GFR, Estimated: >60 mL/min
Glucose, Bld: 89 mg/dL (ref 70–99)
Potassium: 4 mmol/L (ref 3.5–5.1)
Sodium: 143 mmol/L (ref 135–145)

## 2024-08-10 LAB — TROPONIN T, HIGH SENSITIVITY: Troponin T High Sensitivity: 23 ng/L — ABNORMAL HIGH (ref 0–19)

## 2024-08-10 NOTE — ED Provider Notes (Signed)
"  Emergency Department Provider Note   I have reviewed the triage vital signs and the nursing notes.   HISTORY  Chief Complaint Fall   HPI Jeremy Deleon is a 89 y.o. male with past history reviewed below including CAD presents to the emergency department with complaint of a fall.  The patient fell yesterday morning.  He does not recall the circumstances surrounding the fall.  He is here today with his daughter at bedside who states he has fallen like this before when standing too quickly or if his blood sugar gets low.  He is not having any chest pain or shortness of breath at this time.  He had some complaint of left knee discomfort which has been chronic according to the daughter but slightly worse since the fall yesterday.  No known head injury but again patient cannot recall the event clearly. Level 5 caveat: Memory issues.    Past Medical History:  Diagnosis Date   Basal cell carcinoma    BPH (benign prostatic hyperplasia)    CAD S/P percutaneous coronary angioplasty 07/25/2004   Cardiologist-- Dr Anner - [place] '06Abnormal Myoview  with inferolateral ischemia: --> per cardiac cath 09-16-2004 -- 80% prox LAD lesion PCI: Taxus DES 3.0 mm x 16 mm-;; Myoview  June 2009 no ischemia or infarction with attenuation artifact   Chronic constipation    Dyslipidemia, goal LDL below 70    on simvastatin  and tricor    Emphysema of lung (HCC)    Full dentures    GERD (gastroesophageal reflux disease)    Graves' disease 11/ 2016 dx   endocrinologist-  dr alyce staff-- treated with tapazole  until 2018 when labs were normal   History of basal cell carcinoma (BCC) excision    Hypertension    Left hydrocele    Mild atherosclerosis of both carotid arteries    duplex 09-09-2016  1-39% bilateral ICA   Osteoarthritis    S/P drug eluting coronary stent placement 09/16/2004   x1  to prox. LAD   Squamous cell lung cancer : Left lower lobe 07/2014   - oncologist-  dr sherrod---  per lov note in epic no  recurrence: s/p  Bronchoscopy showed extrinsic compression of the left lower lobe with endobronchial biopsies were negative. He subsequently had a needle biopsy which dx the mass as squamous cell carcinoma /  08-10-2014  s/p  Left VATS w/ LLlobectomy with node dissections and completed chemotherapy (11/2014)   Wears glasses    Wears hearing aid in both ears     Review of Systems  Level 5 caveat: memory issues  ____________________________________________   PHYSICAL EXAM:  VITAL SIGNS: ED Triage Vitals  Encounter Vitals Group     BP 08/10/24 1117 133/65     Pulse Rate 08/10/24 1117 72     Resp 08/10/24 1117 18     Temp 08/10/24 1117 98.1 F (36.7 C)     Temp Source 08/10/24 1117 Oral     SpO2 08/10/24 1117 98 %   Constitutional: Alert. Well appearing and in no acute distress. Eyes: Conjunctivae are normal. PERRL. EOMI. Head: Atraumatic. Nose: No congestion/rhinnorhea. Mouth/Throat: Mucous membranes are moist.  Neck: No stridor. No cervical spine tenderness to palpation. Cardiovascular: Normal rate, regular rhythm. Good peripheral circulation. Grossly normal heart sounds.   Respiratory: Normal respiratory effort.  No retractions. Lungs CTAB. Gastrointestinal: Soft and nontender. No distention.  Musculoskeletal: No lower extremity tenderness nor edema. No gross deformities of extremities.  Normal range of motion of the bilateral  hips and knees.  Compartments are soft. Neurologic:  Normal speech and language. No gross focal neurologic deficits are appreciated.  Skin:  Skin is warm, dry and intact. No rash noted.  ____________________________________________   LABS (all labs ordered are listed, but only abnormal results are displayed)  Labs Reviewed  BASIC METABOLIC PANEL WITH GFR - Abnormal; Notable for the following components:      Result Value   BUN 24 (*)    All other components within normal limits  CBC WITH DIFFERENTIAL/PLATELET - Abnormal; Notable for the following  components:   Eosinophils Absolute 0.6 (*)    All other components within normal limits  URINALYSIS, W/ REFLEX TO CULTURE (INFECTION SUSPECTED) - Abnormal; Notable for the following components:   Leukocytes,Ua MODERATE (*)    Bacteria, UA FEW (*)    All other components within normal limits  TROPONIN T, HIGH SENSITIVITY - Abnormal; Notable for the following components:   Troponin T High Sensitivity 23 (*)    All other components within normal limits   ____________________________________________  EKG   EKG Interpretation Date/Time:  Wednesday August 10 2024 11:47:40 EST Ventricular Rate:  67 PR Interval:  219 QRS Duration:  107 QT Interval:  419 QTC Calculation: 443 R Axis:   6  Text Interpretation: Sinus rhythm Borderline prolonged PR interval Abnormal R-wave progression, early transition Confirmed by Darra Chew [phone]) on 08/10/2024 11:54:44 AM        ____________________________________________  RADIOLOGY  DG Hip Unilat W or Wo Pelvis 2-3 Views Left Result Date: 08/10/2024 CLINICAL DATA:  Fall none trauma to the left hip. EXAM: DG HIP (WITH OR WITHOUT PELVIS) 2-3V LEFT COMPARISON:  None Available. FINDINGS: No acute fracture or dislocation. The bones are osteopenic. Moderate bilateral hip arthritic changes. The soft tissues are unremarkable. IMPRESSION: 1. No acute fracture or dislocation. 2. Moderate bilateral hip arthritic changes. Electronically Signed   By: Vanetta Chou M.D.   On: 08/10/2024 13:11   DG Knee Complete 4 Views Left Result Date: 08/10/2024 CLINICAL DATA:  Fall yesterday with left knee pain. EXAM: LEFT KNEE - COMPLETE 4+ VIEW COMPARISON:  None Available. FINDINGS: Moderate tricompartmental osteoarthritic change most prominent over the medial compartment and patellofemoral joints. No acute fracture or dislocation. Small joint effusion is present. IMPRESSION: 1. No acute findings. 2. Moderate osteoarthritic change. Small joint effusion. Electronically  Signed   By: Toribio Agreste M.D.   On: 08/10/2024 13:07   CT Cervical Spine Wo Contrast Result Date: 08/10/2024 CLINICAL DATA:  Status post fall. EXAM: CT CERVICAL SPINE WITHOUT CONTRAST TECHNIQUE: Multidetector CT imaging of the cervical spine was performed without intravenous contrast. Multiplanar CT image reconstructions were also generated. RADIATION DOSE REDUCTION: This exam was performed according to the departmental dose-optimization program which includes automated exposure control, adjustment of the mA and/or kV according to patient size and/or use of iterative reconstruction technique. COMPARISON:  September 16, 2022 FINDINGS: Alignment: Normal. Skull base and vertebrae: No acute fracture. There is chronic versus congenital nonunion of the posterior arch of C2. Soft tissues and spinal canal: No prevertebral fluid or swelling. No visible canal hematoma. Disc levels: Marked severity endplate sclerosis, anterior osteophyte formation and posterior bony spurring are seen, most prominent at the levels of C3-C4, C4-C5, C5-C6 and C6-C7. There is mild to moderate severity intervertebral disc space narrowing at C2-C3 with moderate to marked severity intervertebral disc space narrowing at C3-C4, C4-C5, C5-C6 and C6-C7. Bilateral marked severity multilevel facet joint hypertrophy is noted. Upper chest: There is  mild biapical scarring and/or atelectasis. Other: Stable sclerotic changes are seen involving the posterior aspect of the second left rib. IMPRESSION: 1. No acute fracture or subluxation of the cervical spine. 2. Marked severity multilevel degenerative changes, as described above. Electronically Signed   By: Suzen Dials M.D.   On: 08/10/2024 12:37   CT Head Wo Contrast Result Date: 08/10/2024 CLINICAL DATA:  Status post fall. EXAM: CT HEAD WITHOUT CONTRAST TECHNIQUE: Contiguous axial images were obtained from the base of the skull through the vertex without intravenous contrast. RADIATION DOSE  REDUCTION: This exam was performed according to the departmental dose-optimization program which includes automated exposure control, adjustment of the mA and/or kV according to patient size and/or use of iterative reconstruction technique. COMPARISON:  September 16, 2022 FINDINGS: Brain: There is generalized cerebral atrophy with widening of the extra-axial spaces and ventricular dilatation. There are areas of decreased attenuation within the white matter tracts of the supratentorial brain, consistent with microvascular disease changes. Vascular: No hyperdense vessel or unexpected calcification. Skull: Normal. Negative for fracture or focal lesion. Sinuses/Orbits: There is marked severity bilateral ethmoid sinus mucosal thickening Other: None. IMPRESSION: 1. Generalized cerebral atrophy and microvascular disease changes of the supratentorial brain. 2. Marked severity bilateral ethmoid sinus disease. 3. No acute intracranial abnormality. Electronically Signed   By: Suzen Dials M.D.   On: 08/10/2024 12:31    ____________________________________________   PROCEDURES  Procedure(s) performed:   Procedures  None  ____________________________________________   INITIAL IMPRESSION / ASSESSMENT AND PLAN / ED COURSE  Pertinent labs & imaging results that were available during my care of the patient were reviewed by me and considered in my medical decision making (see chart for details).   This patient is Presenting for Evaluation of fall, which does require a range of treatment options, and is a complaint that involves a high risk of morbidity and mortality.  The Differential Diagnoses includes subdural hematoma, epidural hematoma, acute concussion, traumatic subarachnoid hemorrhage, cerebral contusions, etc.  I did obtain Additional Historical Information from daughter at bedside.   Clinical Laboratory Tests Ordered, included UA without obvious infection.  Minimally elevated troponin to 23.   Creatinine normal.  CBC without leukocytosis or anemia.  Radiologic Tests Ordered, included hip/knee XR and CT head. I independently interpreted the images and agree with radiology interpretation.   Cardiac Monitor Tracing which shows NSR.    Social Determinants of Health Risk patient is not an active smoker.   Medical Decision Making: Summary:  Patient presents emergency department with fall yesterday morning.  Unclear if this was mechanical versus syncope.  Will obtain blood work, UA, EKG and trauma imaging in this setting.  Patient is at his mental status baseline according to family at bedside.  Reevaluation with update and discussion with patient.  He is at mental status baseline per son now at bedside.  Labs and imaging are reassuring.  They are meeting with someone tomorrow to discuss the process for placement as an outpatient.  Stable for discharge home in care of family.   Patient's presentation is most consistent with acute presentation with potential threat to life or bodily function.   Disposition: discharge  ____________________________________________  FINAL CLINICAL IMPRESSION(S) / ED DIAGNOSES  Final diagnoses:  Fall, initial encounter  Contusion of left knee, initial encounter    Note:  This document was prepared using Dragon voice recognition software and may include unintentional dictation errors.  Fonda Law, MD, [facility] Emergency Medicine    Dior Stepter, Fonda MATSU, MD 08/10/24  1530 ° °"

## 2024-08-10 NOTE — ED Notes (Addendum)
 Pt attempted to provide urine sample in the urinal pt stated he went before he came into the Emergency Department, pt unable to provide one at this time will try again later.

## 2024-08-10 NOTE — ED Notes (Signed)
 Discharge paperwork reviewed entirely with patient, including follow up care. Pain was under control. No prescriptions were called in, but all questions were addressed.  Pt verbalized understanding as well as all parties involved. No questions or concerns voiced at the time of discharge. No acute distress noted. Pt was encouraged to stay adequately hydrated and eat a healthy diet.   Pt was wheeled out to the PVA in a wheelchair without incident.  Pt advised they will notify their PCP immediately.  The pt was instructed to set up and/or review MyChart for their results; and was informed their Providers all have access to the information as well.

## 2024-08-10 NOTE — Discharge Instructions (Addendum)
 Your labs and imaging are looking normal.  Please follow closely with your primary care doctor.  Return with any new or suddenly worsening symptoms.

## 2024-08-10 NOTE — ED Triage Notes (Signed)
 Pt reports fall yesterday AM, landed on carpeted floor.   Pt hard of hearing, wearing BL hearing aids.

## 2024-11-14 ENCOUNTER — Other Ambulatory Visit

## 2024-11-21 ENCOUNTER — Ambulatory Visit: Admitting: Internal Medicine
